# Patient Record
Sex: Female | Born: 1940 | Race: White | Hispanic: No | Marital: Single
Health system: Southern US, Academic
[De-identification: ages and names within clinical notes are randomized; demographics above are authoritative.]

## PROBLEM LIST (undated history)

## (undated) ENCOUNTER — Encounter: Attending: Physician Assistant | Primary: Physician Assistant

## (undated) ENCOUNTER — Encounter

## (undated) ENCOUNTER — Ambulatory Visit: Payer: MEDICARE

## (undated) ENCOUNTER — Encounter: Attending: Neurology | Primary: Neurology

## (undated) ENCOUNTER — Telehealth

## (undated) ENCOUNTER — Ambulatory Visit

## (undated) ENCOUNTER — Encounter: Attending: Psychiatry | Primary: Psychiatry

## (undated) ENCOUNTER — Telehealth
Attending: Rehabilitative and Restorative Service Providers" | Primary: Rehabilitative and Restorative Service Providers"

## (undated) ENCOUNTER — Ambulatory Visit
Payer: MEDICARE | Attending: Rehabilitative and Restorative Service Providers" | Primary: Rehabilitative and Restorative Service Providers"

## (undated) ENCOUNTER — Ambulatory Visit: Payer: MEDICARE | Attending: Addiction (Substance Use Disorder) | Primary: Addiction (Substance Use Disorder)

## (undated) ENCOUNTER — Telehealth: Attending: Physical Medicine & Rehabilitation | Primary: Physical Medicine & Rehabilitation

## (undated) ENCOUNTER — Ambulatory Visit: Payer: MEDICARE | Attending: Psychiatric/Mental Health | Primary: Psychiatric/Mental Health

## (undated) ENCOUNTER — Encounter: Attending: Physical Medicine & Rehabilitation | Primary: Physical Medicine & Rehabilitation

## (undated) ENCOUNTER — Encounter: Attending: Psychiatric/Mental Health | Primary: Psychiatric/Mental Health

## (undated) ENCOUNTER — Telehealth: Attending: Addiction (Substance Use Disorder) | Primary: Addiction (Substance Use Disorder)

## (undated) ENCOUNTER — Encounter: Attending: Addiction (Substance Use Disorder) | Primary: Addiction (Substance Use Disorder)

## (undated) ENCOUNTER — Telehealth: Attending: Clinical | Primary: Clinical

## (undated) ENCOUNTER — Ambulatory Visit: Payer: MEDICARE | Attending: Physical Medicine & Rehabilitation | Primary: Physical Medicine & Rehabilitation

## (undated) ENCOUNTER — Ambulatory Visit: Payer: MEDICARE | Attending: Anesthesiology | Primary: Anesthesiology

## (undated) ENCOUNTER — Telehealth: Attending: Physician Assistant | Primary: Physician Assistant

## (undated) ENCOUNTER — Ambulatory Visit: Payer: MEDICARE | Attending: Physician Assistant | Primary: Physician Assistant

## (undated) ENCOUNTER — Telehealth
Attending: Student in an Organized Health Care Education/Training Program | Primary: Student in an Organized Health Care Education/Training Program

## (undated) ENCOUNTER — Encounter: Attending: Adult Health | Primary: Adult Health

## (undated) ENCOUNTER — Encounter: Attending: Ophthalmology | Primary: Ophthalmology

## (undated) ENCOUNTER — Ambulatory Visit: Attending: Clinical | Primary: Clinical

## (undated) ENCOUNTER — Institutional Professional Consult (permissible substitution): Payer: MEDICARE

## (undated) ENCOUNTER — Inpatient Hospital Stay

## (undated) ENCOUNTER — Telehealth: Attending: Psychiatric/Mental Health | Primary: Psychiatric/Mental Health

## (undated) ENCOUNTER — Ambulatory Visit: Payer: MEDICARE | Attending: Internal Medicine | Primary: Internal Medicine

## (undated) ENCOUNTER — Ambulatory Visit: Payer: MEDICARE | Attending: Ophthalmology | Primary: Ophthalmology

## (undated) ENCOUNTER — Ambulatory Visit: Attending: Pharmacist | Primary: Pharmacist

## (undated) DIAGNOSIS — F209 Schizophrenia, unspecified: Secondary | ICD-10-CM

## (undated) DIAGNOSIS — F0390 Unspecified dementia without behavioral disturbance: Secondary | ICD-10-CM

## (undated) HISTORY — PX: HERNIA REPAIR: SHX51

## (undated) MED ORDER — CROMOLYN 5.2 MG/SPRAY (4 %) NASAL SPRAY: Freq: Two times a day (BID) | NASAL | 0 days | Status: SS

## (undated) MED ORDER — ACETAMINOPHEN 325 MG TABLET: Freq: Three times a day (TID) | ORAL | 0 days | Status: SS

## (undated) MED ORDER — ALUMINUM-MAG HYDROXIDE-SIMETHICONE 200 MG-200 MG-20 MG/5 ML ORAL SUSP: Freq: Three times a day (TID) | ORAL | 0 days | Status: SS

## (undated) MED ORDER — BISMUTH SUBSALICYLATE 524 MG/30 ML ORAL SUSPENSION IN PACKET: Freq: Four times a day (QID) | ORAL | 0.00000 days | Status: SS | PRN

## (undated) MED ORDER — BISACODYL 5 MG TABLET,DELAYED RELEASE: Freq: Every day | ORAL | 0 days | Status: SS | PRN

## (undated) MED ORDER — LIDOCAINE 4 % TOPICAL PATCH: Freq: Every day | TRANSDERMAL | 0.00000 days | PRN

## (undated) MED ORDER — DOCUSATE SODIUM 100 MG CAPSULE: Freq: Every day | ORAL | 0 days | Status: SS

## (undated) MED ORDER — OMEGA 3-DHA-EPA-VITAMIN D3 ORAL: Freq: Every day | ORAL | 0 days

## (undated) MED ORDER — DIPHENHYDRAMINE 25 MG CAPSULE: Freq: Every evening | ORAL | 0 days

## (undated) MED ORDER — CLONAZEPAM 0.25 MG DISINTEGRATING TABLET: Freq: Every evening | ORAL | 0 days | Status: SS

## (undated) MED ORDER — ACETAMINOPHEN ER 650 MG TABLET,EXTENDED RELEASE: Freq: Three times a day (TID) | ORAL | 0 days | PRN

## (undated) MED ORDER — FOLIC ACID 800 MCG TABLET: Freq: Every day | ORAL | 0.00000 days | Status: SS

## (undated) MED ORDER — STOMACH RELIEF 525 MG/15 ML ORAL SUSPENSION: Freq: Four times a day (QID) | ORAL | 0.00000 days | Status: SS | PRN

## (undated) MED ORDER — MELATONIN 3 MG TABLET: Freq: Every evening | ORAL | 0 days

## (undated) MED ORDER — FOLTABS 800 ORAL: Freq: Every day | ORAL | 0 days | Status: SS

## (undated) MED ORDER — ASHWAGANDHA ROOT EXTRACT(BULK) MISC: Freq: Every day | ORAL | 0 days

## (undated) MED ORDER — PONARIS NASAL SOLUTION: Freq: Two times a day (BID) | NASAL | 0 days | Status: SS

---
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
## 2017-03-07 ENCOUNTER — Ambulatory Visit: Admission: RE | Admit: 2017-03-07 | Discharge: 2017-03-07 | Payer: MEDICARE | Admitting: Geriatric Medicine

## 2017-03-07 DIAGNOSIS — G249 Dystonia, unspecified: Principal | ICD-10-CM

## 2017-03-07 DIAGNOSIS — F39 Unspecified mood [affective] disorder: Secondary | ICD-10-CM

## 2017-03-07 DIAGNOSIS — K582 Mixed irritable bowel syndrome: Secondary | ICD-10-CM

## 2017-03-07 DIAGNOSIS — G2581 Restless legs syndrome: Secondary | ICD-10-CM

## 2017-03-07 DIAGNOSIS — I10 Essential (primary) hypertension: Secondary | ICD-10-CM

## 2017-03-20 ENCOUNTER — Ambulatory Visit: Admission: RE | Admit: 2017-03-20 | Discharge: 2017-03-20 | Disposition: A | Discharge status: Home / Self Care

## 2017-03-20 DIAGNOSIS — I10 Essential (primary) hypertension: Principal | ICD-10-CM

## 2017-03-20 DIAGNOSIS — G2581 Restless legs syndrome: Secondary | ICD-10-CM

## 2017-03-20 DIAGNOSIS — G4719 Other hypersomnia: Secondary | ICD-10-CM

## 2017-03-20 DIAGNOSIS — G249 Dystonia, unspecified: Secondary | ICD-10-CM

## 2017-03-26 ENCOUNTER — Ambulatory Visit: Admission: RE | Admit: 2017-03-26 | Discharge: 2017-03-26

## 2017-03-26 DIAGNOSIS — G4719 Other hypersomnia: Secondary | ICD-10-CM

## 2017-03-26 DIAGNOSIS — M21371 Foot drop, right foot: Secondary | ICD-10-CM

## 2017-03-26 DIAGNOSIS — I10 Essential (primary) hypertension: Secondary | ICD-10-CM

## 2017-03-26 DIAGNOSIS — E049 Nontoxic goiter, unspecified: Principal | ICD-10-CM

## 2017-03-26 MED ORDER — METHIMAZOLE 5 MG TABLET
ORAL_TABLET | 2 refills | 0 days | Status: CP
Start: 2017-03-26 — End: 2017-12-10

## 2017-04-30 ENCOUNTER — Ambulatory Visit: Admission: RE | Admit: 2017-04-30 | Discharge: 2017-04-30

## 2017-04-30 DIAGNOSIS — I10 Essential (primary) hypertension: Principal | ICD-10-CM

## 2017-04-30 DIAGNOSIS — G4719 Other hypersomnia: Secondary | ICD-10-CM

## 2017-04-30 DIAGNOSIS — F39 Unspecified mood [affective] disorder: Secondary | ICD-10-CM

## 2017-05-05 ENCOUNTER — Ambulatory Visit
Admission: RE | Admit: 2017-05-05 | Discharge: 2017-05-05 | Payer: MEDICARE | Attending: Ophthalmology | Admitting: Ophthalmology

## 2017-05-05 DIAGNOSIS — H2512 Age-related nuclear cataract, left eye: Secondary | ICD-10-CM

## 2017-05-05 DIAGNOSIS — H04123 Dry eye syndrome of bilateral lacrimal glands: Principal | ICD-10-CM

## 2017-05-14 ENCOUNTER — Ambulatory Visit: Admission: RE | Admit: 2017-05-14 | Discharge: 2017-05-14 | Payer: MEDICARE

## 2017-05-15 ENCOUNTER — Ambulatory Visit
Admission: RE | Admit: 2017-05-15 | Discharge: 2017-05-15 | Disposition: A | Discharge status: Home / Self Care | Payer: MEDICARE | Attending: Family | Admitting: Family

## 2017-05-15 DIAGNOSIS — G4761 Periodic limb movement disorder: Secondary | ICD-10-CM

## 2017-05-15 DIAGNOSIS — G4719 Other hypersomnia: Principal | ICD-10-CM

## 2017-05-15 DIAGNOSIS — G2581 Restless legs syndrome: Secondary | ICD-10-CM

## 2017-05-15 DIAGNOSIS — R5383 Other fatigue: Secondary | ICD-10-CM

## 2017-05-16 ENCOUNTER — Ambulatory Visit: Admission: RE | Admit: 2017-05-16 | Discharge: 2017-05-16 | Payer: MEDICARE

## 2017-05-21 ENCOUNTER — Ambulatory Visit
Admission: RE | Admit: 2017-05-21 | Discharge: 2017-06-19 | Disposition: A | Discharge status: Home / Self Care | Payer: MEDICARE | Attending: Rehabilitative and Restorative Service Providers" | Admitting: Rehabilitative and Restorative Service Providers"

## 2017-05-21 ENCOUNTER — Ambulatory Visit
Admission: RE | Admit: 2017-05-21 | Discharge: 2017-06-19 | Disposition: A | Discharge status: Home / Self Care | Attending: Rehabilitative and Restorative Service Providers" | Admitting: Rehabilitative and Restorative Service Providers"

## 2017-05-21 DIAGNOSIS — R269 Unspecified abnormalities of gait and mobility: Secondary | ICD-10-CM

## 2017-05-21 DIAGNOSIS — M21371 Foot drop, right foot: Principal | ICD-10-CM

## 2017-06-02 DIAGNOSIS — M21371 Foot drop, right foot: Principal | ICD-10-CM

## 2017-06-02 DIAGNOSIS — R269 Unspecified abnormalities of gait and mobility: Secondary | ICD-10-CM

## 2017-06-09 MED ORDER — LOSARTAN 50 MG TABLET
ORAL_TABLET | Freq: Two times a day (BID) | ORAL | 3 refills | 0 days | Status: CP
Start: 2017-06-09 — End: 2017-08-26

## 2017-06-16 ENCOUNTER — Ambulatory Visit: Admission: RE | Admit: 2017-06-16 | Discharge: 2017-06-16

## 2017-06-16 DIAGNOSIS — K582 Mixed irritable bowel syndrome: Secondary | ICD-10-CM

## 2017-06-16 DIAGNOSIS — R269 Unspecified abnormalities of gait and mobility: Secondary | ICD-10-CM

## 2017-06-16 DIAGNOSIS — M810 Age-related osteoporosis without current pathological fracture: Principal | ICD-10-CM

## 2017-06-16 DIAGNOSIS — R51 Headache: Secondary | ICD-10-CM

## 2017-06-16 MED ORDER — RIZATRIPTAN 5 MG TABLET
ORAL_TABLET | Freq: Every day | ORAL | 0 refills | 0 days | Status: CP | PRN
Start: 2017-06-16 — End: 2017-07-06

## 2017-06-23 ENCOUNTER — Ambulatory Visit
Admission: RE | Admit: 2017-06-23 | Discharge: 2017-07-19 | Disposition: A | Discharge status: Home / Self Care | Payer: MEDICARE | Attending: Rehabilitative and Restorative Service Providers" | Admitting: Rehabilitative and Restorative Service Providers"

## 2017-06-23 DIAGNOSIS — R269 Unspecified abnormalities of gait and mobility: Secondary | ICD-10-CM

## 2017-06-23 DIAGNOSIS — M21371 Foot drop, right foot: Principal | ICD-10-CM

## 2017-06-30 DIAGNOSIS — M21371 Foot drop, right foot: Principal | ICD-10-CM

## 2017-06-30 DIAGNOSIS — R269 Unspecified abnormalities of gait and mobility: Secondary | ICD-10-CM

## 2017-07-07 ENCOUNTER — Ambulatory Visit
Admission: RE | Admit: 2017-07-07 | Discharge: 2017-07-07 | Payer: MEDICARE | Attending: Ophthalmology | Admitting: Ophthalmology

## 2017-07-07 DIAGNOSIS — H2512 Age-related nuclear cataract, left eye: Secondary | ICD-10-CM

## 2017-07-07 DIAGNOSIS — H04123 Dry eye syndrome of bilateral lacrimal glands: Principal | ICD-10-CM

## 2017-07-07 DIAGNOSIS — H1852 Epithelial (juvenile) corneal dystrophy: Secondary | ICD-10-CM

## 2017-07-07 MED ORDER — RIZATRIPTAN 5 MG TABLET
ORAL_TABLET | Freq: Every day | ORAL | 2 refills | 0 days | Status: CP | PRN
Start: 2017-07-07 — End: 2017-12-10

## 2017-07-14 ENCOUNTER — Ambulatory Visit
Admission: RE | Admit: 2017-07-14 | Discharge: 2017-07-14 | Disposition: A | Discharge status: Home / Self Care | Attending: Internal Medicine

## 2017-07-14 DIAGNOSIS — E0590 Thyrotoxicosis, unspecified without thyrotoxic crisis or storm: Secondary | ICD-10-CM

## 2017-07-14 DIAGNOSIS — M810 Age-related osteoporosis without current pathological fracture: Principal | ICD-10-CM

## 2017-07-14 DIAGNOSIS — E559 Vitamin D deficiency, unspecified: Secondary | ICD-10-CM

## 2017-07-15 ENCOUNTER — Ambulatory Visit
Admission: RE | Admit: 2017-07-15 | Discharge: 2017-07-15 | Disposition: A | Discharge status: Home / Self Care | Attending: Nutritionist | Admitting: Nutritionist

## 2017-07-15 DIAGNOSIS — K9049 Malabsorption due to intolerance, not elsewhere classified: Secondary | ICD-10-CM

## 2017-07-15 DIAGNOSIS — K582 Mixed irritable bowel syndrome: Principal | ICD-10-CM

## 2017-07-17 ENCOUNTER — Ambulatory Visit: Admission: RE | Admit: 2017-07-17 | Discharge: 2017-07-17 | Payer: MEDICARE

## 2017-07-17 DIAGNOSIS — R269 Unspecified abnormalities of gait and mobility: Principal | ICD-10-CM

## 2017-07-17 DIAGNOSIS — R51 Headache: Secondary | ICD-10-CM

## 2017-07-17 DIAGNOSIS — M1990 Unspecified osteoarthritis, unspecified site: Secondary | ICD-10-CM

## 2017-07-17 DIAGNOSIS — I10 Essential (primary) hypertension: Secondary | ICD-10-CM

## 2017-07-23 ENCOUNTER — Ambulatory Visit
Admission: RE | Admit: 2017-07-23 | Discharge: 2017-07-23 | Disposition: A | Discharge status: Home / Self Care | Payer: MEDICARE

## 2017-07-23 DIAGNOSIS — E0590 Thyrotoxicosis, unspecified without thyrotoxic crisis or storm: Principal | ICD-10-CM

## 2017-08-26 ENCOUNTER — Ambulatory Visit: Admit: 2017-08-26 | Discharge: 2017-08-27 | Payer: MEDICARE | Attending: Family Medicine | Primary: Family Medicine

## 2017-08-26 ENCOUNTER — Ambulatory Visit
Admit: 2017-08-26 | Discharge: 2017-09-17 | Payer: MEDICARE | Attending: Rehabilitative and Restorative Service Providers" | Primary: Rehabilitative and Restorative Service Providers"

## 2017-08-26 DIAGNOSIS — W19XXXD Unspecified fall, subsequent encounter: Secondary | ICD-10-CM

## 2017-08-26 DIAGNOSIS — L309 Dermatitis, unspecified: Secondary | ICD-10-CM

## 2017-08-26 DIAGNOSIS — S61219A Laceration without foreign body of unspecified finger without damage to nail, initial encounter: Secondary | ICD-10-CM

## 2017-08-26 DIAGNOSIS — M810 Age-related osteoporosis without current pathological fracture: Secondary | ICD-10-CM

## 2017-08-26 DIAGNOSIS — F39 Unspecified mood [affective] disorder: Secondary | ICD-10-CM

## 2017-08-26 DIAGNOSIS — I10 Essential (primary) hypertension: Principal | ICD-10-CM

## 2017-08-26 DIAGNOSIS — M1990 Unspecified osteoarthritis, unspecified site: Secondary | ICD-10-CM

## 2017-08-26 DIAGNOSIS — R269 Unspecified abnormalities of gait and mobility: Principal | ICD-10-CM

## 2017-08-26 MED ORDER — AMLODIPINE 2.5 MG TABLET
ORAL_TABLET | Freq: Every day | ORAL | 1 refills | 0 days | Status: CP
Start: 2017-08-26 — End: 2017-10-10

## 2017-08-28 ENCOUNTER — Ambulatory Visit: Admit: 2017-08-28 | Discharge: 2017-09-10 | Payer: MEDICARE

## 2017-08-28 DIAGNOSIS — E0590 Thyrotoxicosis, unspecified without thyrotoxic crisis or storm: Principal | ICD-10-CM

## 2017-08-29 DIAGNOSIS — E0590 Thyrotoxicosis, unspecified without thyrotoxic crisis or storm: Principal | ICD-10-CM

## 2017-09-10 ENCOUNTER — Ambulatory Visit: Admit: 2017-09-10 | Discharge: 2017-09-11 | Payer: MEDICARE

## 2017-09-10 DIAGNOSIS — R51 Headache: Secondary | ICD-10-CM

## 2017-09-10 DIAGNOSIS — M810 Age-related osteoporosis without current pathological fracture: Secondary | ICD-10-CM

## 2017-09-10 DIAGNOSIS — F39 Unspecified mood [affective] disorder: Secondary | ICD-10-CM

## 2017-09-10 DIAGNOSIS — Z1239 Encounter for other screening for malignant neoplasm of breast: Secondary | ICD-10-CM

## 2017-09-10 DIAGNOSIS — I10 Essential (primary) hypertension: Principal | ICD-10-CM

## 2017-09-10 DIAGNOSIS — E0590 Thyrotoxicosis, unspecified without thyrotoxic crisis or storm: Secondary | ICD-10-CM

## 2017-09-12 MED ORDER — CARBIDOPA 25 MG-LEVODOPA 100 MG TABLET
ORAL_TABLET | Freq: Every evening | ORAL | 3 refills | 0 days | Status: CP | PRN
Start: 2017-09-12 — End: 2019-03-01

## 2017-09-22 ENCOUNTER — Ambulatory Visit: Admit: 2017-09-22 | Discharge: 2017-09-23 | Payer: MEDICARE | Attending: Ophthalmology | Primary: Ophthalmology

## 2017-09-22 DIAGNOSIS — H1852 Epithelial (juvenile) corneal dystrophy: Secondary | ICD-10-CM

## 2017-09-22 DIAGNOSIS — H04123 Dry eye syndrome of bilateral lacrimal glands: Principal | ICD-10-CM

## 2017-09-22 DIAGNOSIS — H2512 Age-related nuclear cataract, left eye: Secondary | ICD-10-CM

## 2017-09-22 MED ORDER — PREDNISOLONE ACETATE 1 % EYE DROPS,SUSPENSION
1 refills | 0 days | Status: CP
Start: 2017-09-22 — End: 2018-01-31

## 2017-10-10 ENCOUNTER — Ambulatory Visit: Admit: 2017-10-10 | Discharge: 2017-10-11 | Payer: MEDICARE

## 2017-10-10 DIAGNOSIS — K582 Mixed irritable bowel syndrome: Secondary | ICD-10-CM

## 2017-10-10 DIAGNOSIS — R51 Headache: Secondary | ICD-10-CM

## 2017-10-10 DIAGNOSIS — I10 Essential (primary) hypertension: Principal | ICD-10-CM

## 2017-10-10 DIAGNOSIS — E0590 Thyrotoxicosis, unspecified without thyrotoxic crisis or storm: Secondary | ICD-10-CM

## 2017-10-10 DIAGNOSIS — M545 Low back pain: Secondary | ICD-10-CM

## 2017-10-10 DIAGNOSIS — M810 Age-related osteoporosis without current pathological fracture: Secondary | ICD-10-CM

## 2017-10-10 MED ORDER — AMLODIPINE 5 MG TABLET: 5 mg | tablet | Freq: Every day | 1 refills | 0 days | Status: AC

## 2017-10-10 MED ORDER — AMLODIPINE 5 MG TABLET
ORAL_TABLET | Freq: Every day | ORAL | 1 refills | 0.00000 days | Status: CP
Start: 2017-10-10 — End: 2017-10-10

## 2017-10-15 MED ORDER — AMLODIPINE 2.5 MG TABLET
ORAL_TABLET | Freq: Every day | ORAL | 1 refills | 0 days | Status: CP
Start: 2017-10-15 — End: 2017-10-27

## 2017-10-23 MED ORDER — POLYETHYLENE GLYCOL 3350 17 GRAM/DOSE ORAL POWDER
Freq: Every day | ORAL | 3 refills | 0.00000 days | Status: CP
Start: 2017-10-23 — End: 2017-12-10

## 2017-10-27 MED ORDER — AMLODIPINE 2.5 MG TABLET
ORAL_TABLET | Freq: Every day | ORAL | 6 refills | 0.00000 days | Status: CP
Start: 2017-10-27 — End: 2017-11-06

## 2017-10-28 ENCOUNTER — Ambulatory Visit: Admit: 2017-10-28 | Discharge: 2017-10-28 | Payer: MEDICARE

## 2017-10-28 DIAGNOSIS — M810 Age-related osteoporosis without current pathological fracture: Principal | ICD-10-CM

## 2017-10-28 DIAGNOSIS — Z1239 Encounter for other screening for malignant neoplasm of breast: Principal | ICD-10-CM

## 2017-10-31 ENCOUNTER — Ambulatory Visit: Admit: 2017-10-31 | Discharge: 2017-11-01 | Payer: MEDICARE

## 2017-10-31 DIAGNOSIS — M1990 Unspecified osteoarthritis, unspecified site: Principal | ICD-10-CM

## 2017-10-31 DIAGNOSIS — M4800 Spinal stenosis, site unspecified: Secondary | ICD-10-CM

## 2017-11-03 ENCOUNTER — Ambulatory Visit: Admit: 2017-11-03 | Discharge: 2017-11-04 | Payer: MEDICARE | Attending: Ophthalmology | Primary: Ophthalmology

## 2017-11-03 DIAGNOSIS — H04123 Dry eye syndrome of bilateral lacrimal glands: Principal | ICD-10-CM

## 2017-11-03 DIAGNOSIS — H1852 Epithelial (juvenile) corneal dystrophy: Secondary | ICD-10-CM

## 2017-11-03 DIAGNOSIS — H2512 Age-related nuclear cataract, left eye: Secondary | ICD-10-CM

## 2017-11-05 ENCOUNTER — Other Ambulatory Visit: Admit: 2017-11-05 | Discharge: 2017-11-06 | Payer: MEDICARE

## 2017-11-05 DIAGNOSIS — E0590 Thyrotoxicosis, unspecified without thyrotoxic crisis or storm: Secondary | ICD-10-CM

## 2017-11-05 DIAGNOSIS — M810 Age-related osteoporosis without current pathological fracture: Secondary | ICD-10-CM

## 2017-11-05 DIAGNOSIS — I10 Essential (primary) hypertension: Principal | ICD-10-CM

## 2017-11-06 MED ORDER — AMLODIPINE 5 MG TABLET
ORAL_TABLET | Freq: Every day | ORAL | 3 refills | 0.00000 days | Status: CP
Start: 2017-11-06 — End: 2017-12-10

## 2017-11-28 ENCOUNTER — Ambulatory Visit: Admit: 2017-11-28 | Discharge: 2017-11-28 | Payer: MEDICARE

## 2017-11-28 DIAGNOSIS — M1990 Unspecified osteoarthritis, unspecified site: Principal | ICD-10-CM

## 2017-11-28 DIAGNOSIS — M4800 Spinal stenosis, site unspecified: Secondary | ICD-10-CM

## 2017-11-28 DIAGNOSIS — R52 Pain, unspecified: Principal | ICD-10-CM

## 2017-12-05 ENCOUNTER — Ambulatory Visit: Admit: 2017-12-05 | Discharge: 2017-12-16 | Payer: MEDICARE

## 2017-12-05 ENCOUNTER — Ambulatory Visit
Admit: 2017-12-05 | Discharge: 2017-12-16 | Payer: MEDICARE | Attending: Rehabilitative and Restorative Service Providers" | Primary: Rehabilitative and Restorative Service Providers"

## 2017-12-05 DIAGNOSIS — M4800 Spinal stenosis, site unspecified: Secondary | ICD-10-CM

## 2017-12-05 DIAGNOSIS — R269 Unspecified abnormalities of gait and mobility: Secondary | ICD-10-CM

## 2017-12-05 DIAGNOSIS — M1990 Unspecified osteoarthritis, unspecified site: Principal | ICD-10-CM

## 2017-12-10 ENCOUNTER — Ambulatory Visit: Admit: 2017-12-10 | Discharge: 2017-12-11 | Payer: MEDICARE

## 2017-12-10 DIAGNOSIS — M810 Age-related osteoporosis without current pathological fracture: Secondary | ICD-10-CM

## 2017-12-10 DIAGNOSIS — E0590 Thyrotoxicosis, unspecified without thyrotoxic crisis or storm: Secondary | ICD-10-CM

## 2017-12-10 DIAGNOSIS — H04129 Dry eye syndrome of unspecified lacrimal gland: Secondary | ICD-10-CM

## 2017-12-10 DIAGNOSIS — F39 Unspecified mood [affective] disorder: Secondary | ICD-10-CM

## 2017-12-10 DIAGNOSIS — W19XXXD Unspecified fall, subsequent encounter: Secondary | ICD-10-CM

## 2017-12-10 DIAGNOSIS — M1711 Unilateral primary osteoarthritis, right knee: Secondary | ICD-10-CM

## 2017-12-10 DIAGNOSIS — M545 Low back pain: Principal | ICD-10-CM

## 2017-12-10 DIAGNOSIS — I10 Essential (primary) hypertension: Secondary | ICD-10-CM

## 2017-12-10 DIAGNOSIS — E049 Nontoxic goiter, unspecified: Secondary | ICD-10-CM

## 2017-12-10 MED ORDER — LOSARTAN 50 MG TABLET
ORAL_TABLET | Freq: Two times a day (BID) | ORAL | 3 refills | 0 days | Status: CP
Start: 2017-12-10 — End: 2018-11-19

## 2017-12-10 MED ORDER — AMLODIPINE 5 MG TABLET
ORAL_TABLET | 3 refills | 0 days | Status: CP
Start: 2017-12-10 — End: 2018-05-26

## 2017-12-12 DIAGNOSIS — R269 Unspecified abnormalities of gait and mobility: Secondary | ICD-10-CM

## 2017-12-12 DIAGNOSIS — M4800 Spinal stenosis, site unspecified: Secondary | ICD-10-CM

## 2017-12-12 DIAGNOSIS — M1990 Unspecified osteoarthritis, unspecified site: Principal | ICD-10-CM

## 2017-12-12 DIAGNOSIS — M21371 Foot drop, right foot: Secondary | ICD-10-CM

## 2017-12-19 ENCOUNTER — Ambulatory Visit
Admit: 2017-12-19 | Discharge: 2018-01-15 | Payer: MEDICARE | Attending: Rehabilitative and Restorative Service Providers" | Primary: Rehabilitative and Restorative Service Providers"

## 2017-12-19 ENCOUNTER — Ambulatory Visit: Admit: 2017-12-19 | Discharge: 2018-01-15 | Payer: MEDICARE

## 2017-12-19 DIAGNOSIS — M4800 Spinal stenosis, site unspecified: Secondary | ICD-10-CM

## 2017-12-19 DIAGNOSIS — R269 Unspecified abnormalities of gait and mobility: Secondary | ICD-10-CM

## 2017-12-19 DIAGNOSIS — M1990 Unspecified osteoarthritis, unspecified site: Principal | ICD-10-CM

## 2017-12-22 ENCOUNTER — Ambulatory Visit: Admit: 2017-12-22 | Discharge: 2017-12-22 | Payer: MEDICARE

## 2017-12-22 DIAGNOSIS — E049 Nontoxic goiter, unspecified: Principal | ICD-10-CM

## 2017-12-25 DIAGNOSIS — M4800 Spinal stenosis, site unspecified: Secondary | ICD-10-CM

## 2017-12-25 DIAGNOSIS — R269 Unspecified abnormalities of gait and mobility: Secondary | ICD-10-CM

## 2017-12-25 DIAGNOSIS — M1990 Unspecified osteoarthritis, unspecified site: Principal | ICD-10-CM

## 2017-12-29 ENCOUNTER — Institutional Professional Consult (permissible substitution): Admit: 2017-12-29 | Discharge: 2017-12-30 | Payer: MEDICARE | Attending: Pharmacist | Primary: Pharmacist

## 2017-12-29 DIAGNOSIS — M810 Age-related osteoporosis without current pathological fracture: Principal | ICD-10-CM

## 2017-12-31 DIAGNOSIS — M1990 Unspecified osteoarthritis, unspecified site: Principal | ICD-10-CM

## 2017-12-31 DIAGNOSIS — R269 Unspecified abnormalities of gait and mobility: Secondary | ICD-10-CM

## 2018-01-14 ENCOUNTER — Ambulatory Visit: Admit: 2018-01-14 | Discharge: 2018-01-15 | Payer: MEDICARE

## 2018-01-14 DIAGNOSIS — J301 Allergic rhinitis due to pollen: Principal | ICD-10-CM

## 2018-01-20 ENCOUNTER — Ambulatory Visit: Admit: 2018-01-20 | Discharge: 2018-01-21 | Payer: MEDICARE | Attending: Internal Medicine | Primary: Internal Medicine

## 2018-01-20 DIAGNOSIS — M1711 Unilateral primary osteoarthritis, right knee: Principal | ICD-10-CM

## 2018-01-20 DIAGNOSIS — R269 Unspecified abnormalities of gait and mobility: Secondary | ICD-10-CM

## 2018-01-20 DIAGNOSIS — M792 Neuralgia and neuritis, unspecified: Secondary | ICD-10-CM

## 2018-01-20 DIAGNOSIS — M545 Low back pain: Secondary | ICD-10-CM

## 2018-01-21 ENCOUNTER — Ambulatory Visit
Admit: 2018-01-21 | Discharge: 2018-02-14 | Payer: MEDICARE | Attending: Rehabilitative and Restorative Service Providers" | Primary: Rehabilitative and Restorative Service Providers"

## 2018-01-21 ENCOUNTER — Ambulatory Visit: Admit: 2018-01-21 | Discharge: 2018-02-14 | Payer: MEDICARE

## 2018-01-21 DIAGNOSIS — R269 Unspecified abnormalities of gait and mobility: Secondary | ICD-10-CM

## 2018-01-21 DIAGNOSIS — M1990 Unspecified osteoarthritis, unspecified site: Principal | ICD-10-CM

## 2018-01-21 DIAGNOSIS — M4800 Spinal stenosis, site unspecified: Secondary | ICD-10-CM

## 2018-01-21 DIAGNOSIS — M21371 Foot drop, right foot: Secondary | ICD-10-CM

## 2018-01-28 DIAGNOSIS — M21371 Foot drop, right foot: Secondary | ICD-10-CM

## 2018-01-28 DIAGNOSIS — M1990 Unspecified osteoarthritis, unspecified site: Principal | ICD-10-CM

## 2018-01-28 DIAGNOSIS — R269 Unspecified abnormalities of gait and mobility: Secondary | ICD-10-CM

## 2018-01-28 DIAGNOSIS — M4800 Spinal stenosis, site unspecified: Secondary | ICD-10-CM

## 2018-02-02 ENCOUNTER — Inpatient Hospital Stay: Admit: 2018-02-02 | Payer: MEDICARE

## 2018-02-02 MED ORDER — PREDNISOLONE ACETATE 1 % EYE DROPS,SUSPENSION
1 refills | 0 days | Status: CP
Start: 2018-02-02 — End: ?

## 2018-02-11 ENCOUNTER — Ambulatory Visit: Admit: 2018-02-11 | Discharge: 2018-02-12 | Payer: MEDICARE

## 2018-02-11 DIAGNOSIS — H1852 Epithelial (juvenile) corneal dystrophy: Principal | ICD-10-CM

## 2018-02-11 DIAGNOSIS — H2512 Age-related nuclear cataract, left eye: Secondary | ICD-10-CM

## 2018-02-11 DIAGNOSIS — H04123 Dry eye syndrome of bilateral lacrimal glands: Secondary | ICD-10-CM

## 2018-02-11 MED ORDER — POLYMYXIN B SULFATE 10,000 UNIT-TRIMETHOPRIM 1 MG/ML EYE DROPS
Freq: Four times a day (QID) | OPHTHALMIC | 0 refills | 0 days | Status: CP
Start: 2018-02-11 — End: ?

## 2018-02-11 MED ORDER — SODIUM CHLORIDE 5 % EYE OINTMENT
Freq: Every evening | OPHTHALMIC | 12 refills | 0 days | Status: CP
Start: 2018-02-11 — End: 2019-03-01

## 2018-02-11 MED ORDER — SODIUM CHLORIDE 5 % EYE DROPS
Freq: Four times a day (QID) | OPHTHALMIC | 12 refills | 0.00000 days | Status: CP
Start: 2018-02-11 — End: 2019-02-11

## 2018-02-20 ENCOUNTER — Ambulatory Visit: Admit: 2018-02-20 | Discharge: 2018-02-21 | Payer: MEDICARE | Attending: Ophthalmology | Primary: Ophthalmology

## 2018-02-20 DIAGNOSIS — H04123 Dry eye syndrome of bilateral lacrimal glands: Secondary | ICD-10-CM

## 2018-02-20 DIAGNOSIS — H1852 Epithelial (juvenile) corneal dystrophy: Principal | ICD-10-CM

## 2018-02-20 DIAGNOSIS — H2512 Age-related nuclear cataract, left eye: Secondary | ICD-10-CM

## 2018-04-03 ENCOUNTER — Ambulatory Visit: Admit: 2018-04-03 | Discharge: 2018-04-04 | Payer: MEDICARE

## 2018-04-03 DIAGNOSIS — M545 Low back pain: Secondary | ICD-10-CM

## 2018-04-03 DIAGNOSIS — J301 Allergic rhinitis due to pollen: Secondary | ICD-10-CM

## 2018-04-03 DIAGNOSIS — H04129 Dry eye syndrome of unspecified lacrimal gland: Principal | ICD-10-CM

## 2018-04-03 DIAGNOSIS — I10 Essential (primary) hypertension: Secondary | ICD-10-CM

## 2018-04-03 DIAGNOSIS — E0590 Thyrotoxicosis, unspecified without thyrotoxic crisis or storm: Secondary | ICD-10-CM

## 2018-04-03 DIAGNOSIS — Z1211 Encounter for screening for malignant neoplasm of colon: Secondary | ICD-10-CM

## 2018-04-03 DIAGNOSIS — F39 Unspecified mood [affective] disorder: Secondary | ICD-10-CM

## 2018-04-08 ENCOUNTER — Ambulatory Visit
Admit: 2018-04-08 | Discharge: 2018-04-09 | Payer: MEDICARE | Attending: Physical Medicine & Rehabilitation | Primary: Physical Medicine & Rehabilitation

## 2018-04-08 DIAGNOSIS — M1711 Unilateral primary osteoarthritis, right knee: Principal | ICD-10-CM

## 2018-04-08 DIAGNOSIS — M545 Low back pain: Secondary | ICD-10-CM

## 2018-04-08 DIAGNOSIS — M792 Neuralgia and neuritis, unspecified: Secondary | ICD-10-CM

## 2018-04-08 DIAGNOSIS — M21371 Foot drop, right foot: Secondary | ICD-10-CM

## 2018-04-08 DIAGNOSIS — R269 Unspecified abnormalities of gait and mobility: Secondary | ICD-10-CM

## 2018-04-10 MED ORDER — DIAZEPAM 5 MG TABLET
ORAL_TABLET | Freq: Once | ORAL | 0 refills | 0 days | Status: CP
Start: 2018-04-10 — End: 2018-04-10

## 2018-04-28 ENCOUNTER — Ambulatory Visit: Admit: 2018-04-28 | Discharge: 2018-04-29 | Payer: MEDICARE

## 2018-04-28 DIAGNOSIS — R269 Unspecified abnormalities of gait and mobility: Secondary | ICD-10-CM

## 2018-04-28 DIAGNOSIS — M21371 Foot drop, right foot: Secondary | ICD-10-CM

## 2018-04-28 DIAGNOSIS — M545 Low back pain: Principal | ICD-10-CM

## 2018-05-08 ENCOUNTER — Ambulatory Visit
Admit: 2018-05-08 | Discharge: 2018-05-09 | Payer: MEDICARE | Attending: Physical Medicine & Rehabilitation | Primary: Physical Medicine & Rehabilitation

## 2018-05-08 DIAGNOSIS — R269 Unspecified abnormalities of gait and mobility: Secondary | ICD-10-CM

## 2018-05-08 DIAGNOSIS — M21371 Foot drop, right foot: Secondary | ICD-10-CM

## 2018-05-08 DIAGNOSIS — M1711 Unilateral primary osteoarthritis, right knee: Secondary | ICD-10-CM

## 2018-05-08 DIAGNOSIS — M48061 Spinal stenosis, lumbar region without neurogenic claudication: Principal | ICD-10-CM

## 2018-05-08 DIAGNOSIS — L26 Exfoliative dermatitis: Secondary | ICD-10-CM

## 2018-05-26 ENCOUNTER — Ambulatory Visit
Admit: 2018-05-26 | Discharge: 2018-05-27 | Payer: MEDICARE | Attending: Physician Assistant | Primary: Physician Assistant

## 2018-05-26 DIAGNOSIS — L309 Dermatitis, unspecified: Secondary | ICD-10-CM

## 2018-05-26 DIAGNOSIS — L570 Actinic keratosis: Principal | ICD-10-CM

## 2018-05-26 DIAGNOSIS — I781 Nevus, non-neoplastic: Secondary | ICD-10-CM

## 2018-05-26 MED ORDER — AMLODIPINE 5 MG TABLET
ORAL_TABLET | 3 refills | 0 days | Status: CP
Start: 2018-05-26 — End: 2018-05-27

## 2018-05-27 ENCOUNTER — Ambulatory Visit: Admit: 2018-05-27 | Discharge: 2018-05-28 | Payer: MEDICARE

## 2018-05-27 ENCOUNTER — Ambulatory Visit
Admit: 2018-05-27 | Discharge: 2018-05-28 | Payer: MEDICARE | Attending: Physical Medicine & Rehabilitation | Primary: Physical Medicine & Rehabilitation

## 2018-05-27 DIAGNOSIS — M21371 Foot drop, right foot: Secondary | ICD-10-CM

## 2018-05-27 DIAGNOSIS — H04129 Dry eye syndrome of unspecified lacrimal gland: Principal | ICD-10-CM

## 2018-05-27 DIAGNOSIS — H1852 Epithelial (juvenile) corneal dystrophy: Secondary | ICD-10-CM

## 2018-05-27 DIAGNOSIS — M545 Low back pain: Principal | ICD-10-CM

## 2018-05-27 MED ORDER — AMLODIPINE 5 MG TABLET
ORAL_TABLET | Freq: Two times a day (BID) | ORAL | 3 refills | 0.00000 days | Status: CP
Start: 2018-05-27 — End: ?

## 2018-06-05 ENCOUNTER — Ambulatory Visit
Admit: 2018-06-05 | Discharge: 2018-06-06 | Payer: MEDICARE | Attending: Physical Medicine & Rehabilitation | Primary: Physical Medicine & Rehabilitation

## 2018-06-05 DIAGNOSIS — M21371 Foot drop, right foot: Secondary | ICD-10-CM

## 2018-06-05 DIAGNOSIS — M48061 Spinal stenosis, lumbar region without neurogenic claudication: Principal | ICD-10-CM

## 2018-06-05 DIAGNOSIS — R269 Unspecified abnormalities of gait and mobility: Secondary | ICD-10-CM

## 2018-06-05 DIAGNOSIS — M1711 Unilateral primary osteoarthritis, right knee: Secondary | ICD-10-CM

## 2018-06-17 ENCOUNTER — Ambulatory Visit: Admit: 2018-06-17 | Discharge: 2018-06-18 | Payer: MEDICARE

## 2018-06-17 DIAGNOSIS — E0590 Thyrotoxicosis, unspecified without thyrotoxic crisis or storm: Secondary | ICD-10-CM

## 2018-06-17 DIAGNOSIS — Z1211 Encounter for screening for malignant neoplasm of colon: Secondary | ICD-10-CM

## 2018-06-17 DIAGNOSIS — M545 Low back pain: Principal | ICD-10-CM

## 2018-06-17 DIAGNOSIS — M810 Age-related osteoporosis without current pathological fracture: Secondary | ICD-10-CM

## 2018-06-17 DIAGNOSIS — F39 Unspecified mood [affective] disorder: Secondary | ICD-10-CM

## 2018-06-22 ENCOUNTER — Ambulatory Visit
Admit: 2018-06-22 | Discharge: 2018-07-21 | Payer: MEDICARE | Attending: Rehabilitative and Restorative Service Providers" | Primary: Rehabilitative and Restorative Service Providers"

## 2018-06-22 DIAGNOSIS — M48061 Spinal stenosis, lumbar region without neurogenic claudication: Principal | ICD-10-CM

## 2018-06-22 DIAGNOSIS — M21371 Foot drop, right foot: Secondary | ICD-10-CM

## 2018-06-22 DIAGNOSIS — R269 Unspecified abnormalities of gait and mobility: Secondary | ICD-10-CM

## 2018-06-29 DIAGNOSIS — M48061 Spinal stenosis, lumbar region without neurogenic claudication: Principal | ICD-10-CM

## 2018-06-29 DIAGNOSIS — M21371 Foot drop, right foot: Secondary | ICD-10-CM

## 2018-06-29 DIAGNOSIS — M1990 Unspecified osteoarthritis, unspecified site: Secondary | ICD-10-CM

## 2018-06-29 DIAGNOSIS — M4800 Spinal stenosis, site unspecified: Secondary | ICD-10-CM

## 2018-06-29 DIAGNOSIS — R269 Unspecified abnormalities of gait and mobility: Secondary | ICD-10-CM

## 2018-07-03 DIAGNOSIS — M21371 Foot drop, right foot: Secondary | ICD-10-CM

## 2018-07-03 DIAGNOSIS — R269 Unspecified abnormalities of gait and mobility: Secondary | ICD-10-CM

## 2018-07-03 DIAGNOSIS — M1990 Unspecified osteoarthritis, unspecified site: Secondary | ICD-10-CM

## 2018-07-03 DIAGNOSIS — M4800 Spinal stenosis, site unspecified: Secondary | ICD-10-CM

## 2018-07-03 DIAGNOSIS — M48061 Spinal stenosis, lumbar region without neurogenic claudication: Principal | ICD-10-CM

## 2018-07-06 DIAGNOSIS — M1990 Unspecified osteoarthritis, unspecified site: Secondary | ICD-10-CM

## 2018-07-06 DIAGNOSIS — M21371 Foot drop, right foot: Secondary | ICD-10-CM

## 2018-07-06 DIAGNOSIS — M48061 Spinal stenosis, lumbar region without neurogenic claudication: Principal | ICD-10-CM

## 2018-07-06 DIAGNOSIS — M4800 Spinal stenosis, site unspecified: Secondary | ICD-10-CM

## 2018-07-06 DIAGNOSIS — R269 Unspecified abnormalities of gait and mobility: Secondary | ICD-10-CM

## 2018-07-10 DIAGNOSIS — M21371 Foot drop, right foot: Secondary | ICD-10-CM

## 2018-07-10 DIAGNOSIS — M48061 Spinal stenosis, lumbar region without neurogenic claudication: Principal | ICD-10-CM

## 2018-07-10 DIAGNOSIS — R269 Unspecified abnormalities of gait and mobility: Secondary | ICD-10-CM

## 2018-07-10 DIAGNOSIS — M1990 Unspecified osteoarthritis, unspecified site: Secondary | ICD-10-CM

## 2018-07-13 DIAGNOSIS — M1990 Unspecified osteoarthritis, unspecified site: Secondary | ICD-10-CM

## 2018-07-13 DIAGNOSIS — R269 Unspecified abnormalities of gait and mobility: Secondary | ICD-10-CM

## 2018-07-13 DIAGNOSIS — M48061 Spinal stenosis, lumbar region without neurogenic claudication: Principal | ICD-10-CM

## 2018-07-13 DIAGNOSIS — M21371 Foot drop, right foot: Secondary | ICD-10-CM

## 2018-07-13 DIAGNOSIS — M4800 Spinal stenosis, site unspecified: Secondary | ICD-10-CM

## 2018-07-15 DIAGNOSIS — M1990 Unspecified osteoarthritis, unspecified site: Secondary | ICD-10-CM

## 2018-07-15 DIAGNOSIS — R269 Unspecified abnormalities of gait and mobility: Secondary | ICD-10-CM

## 2018-07-15 DIAGNOSIS — M4800 Spinal stenosis, site unspecified: Secondary | ICD-10-CM

## 2018-07-15 DIAGNOSIS — M48061 Spinal stenosis, lumbar region without neurogenic claudication: Principal | ICD-10-CM

## 2018-07-15 DIAGNOSIS — M21371 Foot drop, right foot: Secondary | ICD-10-CM

## 2018-07-28 ENCOUNTER — Ambulatory Visit: Admit: 2018-07-28 | Discharge: 2018-07-29 | Payer: MEDICARE

## 2018-07-28 DIAGNOSIS — M48061 Spinal stenosis, lumbar region without neurogenic claudication: Principal | ICD-10-CM

## 2018-07-28 DIAGNOSIS — M21371 Foot drop, right foot: Secondary | ICD-10-CM

## 2018-07-28 DIAGNOSIS — M47816 Spondylosis without myelopathy or radiculopathy, lumbar region: Secondary | ICD-10-CM

## 2018-07-28 DIAGNOSIS — M5136 Other intervertebral disc degeneration, lumbar region: Secondary | ICD-10-CM

## 2018-07-29 ENCOUNTER — Ambulatory Visit: Admit: 2018-07-29 | Discharge: 2018-07-30 | Payer: MEDICARE

## 2018-07-29 DIAGNOSIS — M545 Low back pain: Secondary | ICD-10-CM

## 2018-07-29 DIAGNOSIS — H04129 Dry eye syndrome of unspecified lacrimal gland: Secondary | ICD-10-CM

## 2018-07-29 DIAGNOSIS — E0590 Thyrotoxicosis, unspecified without thyrotoxic crisis or storm: Principal | ICD-10-CM

## 2018-07-29 DIAGNOSIS — F39 Unspecified mood [affective] disorder: Secondary | ICD-10-CM

## 2018-07-31 ENCOUNTER — Ambulatory Visit
Admit: 2018-07-31 | Discharge: 2018-08-01 | Payer: MEDICARE | Attending: Physical Medicine & Rehabilitation | Primary: Physical Medicine & Rehabilitation

## 2018-07-31 DIAGNOSIS — R269 Unspecified abnormalities of gait and mobility: Secondary | ICD-10-CM

## 2018-07-31 DIAGNOSIS — M1711 Unilateral primary osteoarthritis, right knee: Secondary | ICD-10-CM

## 2018-07-31 DIAGNOSIS — M21371 Foot drop, right foot: Secondary | ICD-10-CM

## 2018-07-31 DIAGNOSIS — M47812 Spondylosis without myelopathy or radiculopathy, cervical region: Secondary | ICD-10-CM

## 2018-07-31 DIAGNOSIS — M48061 Spinal stenosis, lumbar region without neurogenic claudication: Principal | ICD-10-CM

## 2018-07-31 DIAGNOSIS — M4802 Spinal stenosis, cervical region: Secondary | ICD-10-CM

## 2018-08-20 ENCOUNTER — Ambulatory Visit
Admit: 2018-08-20 | Discharge: 2018-08-21 | Payer: MEDICARE | Attending: Physician Assistant | Primary: Physician Assistant

## 2018-08-20 DIAGNOSIS — L814 Other melanin hyperpigmentation: Secondary | ICD-10-CM

## 2018-08-20 DIAGNOSIS — I781 Nevus, non-neoplastic: Secondary | ICD-10-CM

## 2018-08-20 DIAGNOSIS — L570 Actinic keratosis: Principal | ICD-10-CM

## 2018-08-26 ENCOUNTER — Inpatient Hospital Stay: Admit: 2018-08-26 | Discharge: 2018-09-24 | Payer: MEDICARE

## 2018-09-02 ENCOUNTER — Ambulatory Visit: Admit: 2018-09-02 | Discharge: 2018-09-03 | Payer: MEDICARE

## 2018-09-02 DIAGNOSIS — M810 Age-related osteoporosis without current pathological fracture: Secondary | ICD-10-CM

## 2018-09-02 DIAGNOSIS — E0590 Thyrotoxicosis, unspecified without thyrotoxic crisis or storm: Secondary | ICD-10-CM

## 2018-09-02 DIAGNOSIS — M545 Low back pain: Principal | ICD-10-CM

## 2018-09-02 DIAGNOSIS — F39 Unspecified mood [affective] disorder: Secondary | ICD-10-CM

## 2018-09-03 ENCOUNTER — Ambulatory Visit
Admit: 2018-09-03 | Discharge: 2018-09-04 | Payer: MEDICARE | Attending: Physician Assistant | Primary: Physician Assistant

## 2018-09-03 DIAGNOSIS — R109 Unspecified abdominal pain: Principal | ICD-10-CM

## 2018-10-01 ENCOUNTER — Institutional Professional Consult (permissible substitution): Admit: 2018-10-01 | Discharge: 2018-10-02 | Payer: MEDICARE

## 2018-10-01 DIAGNOSIS — R109 Unspecified abdominal pain: Principal | ICD-10-CM

## 2018-10-16 ENCOUNTER — Ambulatory Visit
Admit: 2018-10-16 | Discharge: 2018-10-17 | Payer: MEDICARE | Attending: Physical Medicine & Rehabilitation | Primary: Physical Medicine & Rehabilitation

## 2018-10-16 DIAGNOSIS — M810 Age-related osteoporosis without current pathological fracture: Secondary | ICD-10-CM

## 2018-10-16 DIAGNOSIS — M797 Fibromyalgia: Secondary | ICD-10-CM

## 2018-10-16 DIAGNOSIS — K219 Gastro-esophageal reflux disease without esophagitis: Secondary | ICD-10-CM

## 2018-10-16 DIAGNOSIS — M21371 Foot drop, right foot: Secondary | ICD-10-CM

## 2018-10-16 DIAGNOSIS — R269 Unspecified abnormalities of gait and mobility: Secondary | ICD-10-CM

## 2018-10-16 DIAGNOSIS — M48061 Spinal stenosis, lumbar region without neurogenic claudication: Secondary | ICD-10-CM

## 2018-10-16 DIAGNOSIS — M7918 Myalgia, other site: Secondary | ICD-10-CM

## 2018-10-16 DIAGNOSIS — M19042 Primary osteoarthritis, left hand: Secondary | ICD-10-CM

## 2018-10-16 DIAGNOSIS — I10 Essential (primary) hypertension: Secondary | ICD-10-CM

## 2018-10-16 DIAGNOSIS — M545 Low back pain: Principal | ICD-10-CM

## 2018-10-16 DIAGNOSIS — H1850 Unspecified hereditary corneal dystrophies: Secondary | ICD-10-CM

## 2018-10-16 DIAGNOSIS — M2170 Unequal limb length (acquired), unspecified site: Secondary | ICD-10-CM

## 2018-10-16 DIAGNOSIS — Z602 Problems related to living alone: Secondary | ICD-10-CM

## 2018-10-16 DIAGNOSIS — Z7982 Long term (current) use of aspirin: Secondary | ICD-10-CM

## 2018-10-16 DIAGNOSIS — Z9181 History of falling: Secondary | ICD-10-CM

## 2018-10-16 DIAGNOSIS — J301 Allergic rhinitis due to pollen: Secondary | ICD-10-CM

## 2018-10-16 DIAGNOSIS — M47812 Spondylosis without myelopathy or radiculopathy, cervical region: Secondary | ICD-10-CM

## 2018-10-16 DIAGNOSIS — E049 Nontoxic goiter, unspecified: Secondary | ICD-10-CM

## 2018-10-16 DIAGNOSIS — M19041 Primary osteoarthritis, right hand: Secondary | ICD-10-CM

## 2018-10-16 DIAGNOSIS — Z87891 Personal history of nicotine dependence: Secondary | ICD-10-CM

## 2018-10-16 DIAGNOSIS — G2581 Restless legs syndrome: Secondary | ICD-10-CM

## 2018-10-16 DIAGNOSIS — M4802 Spinal stenosis, cervical region: Secondary | ICD-10-CM

## 2018-10-16 DIAGNOSIS — E0590 Thyrotoxicosis, unspecified without thyrotoxic crisis or storm: Secondary | ICD-10-CM

## 2018-10-16 DIAGNOSIS — H04123 Dry eye syndrome of bilateral lacrimal glands: Secondary | ICD-10-CM

## 2018-10-16 DIAGNOSIS — K582 Mixed irritable bowel syndrome: Secondary | ICD-10-CM

## 2018-10-17 ENCOUNTER — Encounter: Admit: 2018-10-17 | Discharge: 2018-11-13 | Payer: MEDICARE

## 2018-10-17 ENCOUNTER — Inpatient Hospital Stay: Admit: 2018-10-17 | Discharge: 2018-11-13 | Payer: MEDICARE

## 2018-10-17 DIAGNOSIS — K219 Gastro-esophageal reflux disease without esophagitis: Secondary | ICD-10-CM

## 2018-10-17 DIAGNOSIS — M4802 Spinal stenosis, cervical region: Secondary | ICD-10-CM

## 2018-10-17 DIAGNOSIS — E049 Nontoxic goiter, unspecified: Secondary | ICD-10-CM

## 2018-10-17 DIAGNOSIS — G2581 Restless legs syndrome: Secondary | ICD-10-CM

## 2018-10-17 DIAGNOSIS — M797 Fibromyalgia: Secondary | ICD-10-CM

## 2018-10-17 DIAGNOSIS — I10 Essential (primary) hypertension: Secondary | ICD-10-CM

## 2018-10-17 DIAGNOSIS — Z602 Problems related to living alone: Secondary | ICD-10-CM

## 2018-10-17 DIAGNOSIS — M19042 Primary osteoarthritis, left hand: Secondary | ICD-10-CM

## 2018-10-17 DIAGNOSIS — Z7982 Long term (current) use of aspirin: Secondary | ICD-10-CM

## 2018-10-17 DIAGNOSIS — K582 Mixed irritable bowel syndrome: Secondary | ICD-10-CM

## 2018-10-17 DIAGNOSIS — Z9181 History of falling: Secondary | ICD-10-CM

## 2018-10-17 DIAGNOSIS — Z87891 Personal history of nicotine dependence: Secondary | ICD-10-CM

## 2018-10-17 DIAGNOSIS — M19041 Primary osteoarthritis, right hand: Secondary | ICD-10-CM

## 2018-10-17 DIAGNOSIS — M2170 Unequal limb length (acquired), unspecified site: Secondary | ICD-10-CM

## 2018-10-17 DIAGNOSIS — H04123 Dry eye syndrome of bilateral lacrimal glands: Secondary | ICD-10-CM

## 2018-10-17 DIAGNOSIS — M810 Age-related osteoporosis without current pathological fracture: Secondary | ICD-10-CM

## 2018-10-17 DIAGNOSIS — M48061 Spinal stenosis, lumbar region without neurogenic claudication: Secondary | ICD-10-CM

## 2018-10-17 DIAGNOSIS — M47812 Spondylosis without myelopathy or radiculopathy, cervical region: Secondary | ICD-10-CM

## 2018-10-17 DIAGNOSIS — H1850 Unspecified hereditary corneal dystrophies: Secondary | ICD-10-CM

## 2018-10-17 DIAGNOSIS — J301 Allergic rhinitis due to pollen: Secondary | ICD-10-CM

## 2018-10-17 DIAGNOSIS — M21371 Foot drop, right foot: Principal | ICD-10-CM

## 2018-10-17 DIAGNOSIS — E0590 Thyrotoxicosis, unspecified without thyrotoxic crisis or storm: Secondary | ICD-10-CM

## 2018-10-19 DIAGNOSIS — M47812 Spondylosis without myelopathy or radiculopathy, cervical region: Principal | ICD-10-CM

## 2018-10-19 DIAGNOSIS — M48061 Spinal stenosis, lumbar region without neurogenic claudication: Principal | ICD-10-CM

## 2018-10-19 DIAGNOSIS — M19042 Primary osteoarthritis, left hand: Principal | ICD-10-CM

## 2018-10-19 DIAGNOSIS — M2170 Unequal limb length (acquired), unspecified site: Principal | ICD-10-CM

## 2018-10-19 DIAGNOSIS — M4802 Spinal stenosis, cervical region: Principal | ICD-10-CM

## 2018-10-19 DIAGNOSIS — H04123 Dry eye syndrome of bilateral lacrimal glands: Principal | ICD-10-CM

## 2018-10-19 DIAGNOSIS — Z602 Problems related to living alone: Principal | ICD-10-CM

## 2018-10-19 DIAGNOSIS — J301 Allergic rhinitis due to pollen: Principal | ICD-10-CM

## 2018-10-19 DIAGNOSIS — Z9181 History of falling: Principal | ICD-10-CM

## 2018-10-19 DIAGNOSIS — H1850 Unspecified hereditary corneal dystrophies: Principal | ICD-10-CM

## 2018-10-19 DIAGNOSIS — I10 Essential (primary) hypertension: Principal | ICD-10-CM

## 2018-10-19 DIAGNOSIS — E049 Nontoxic goiter, unspecified: Principal | ICD-10-CM

## 2018-10-19 DIAGNOSIS — M810 Age-related osteoporosis without current pathological fracture: Principal | ICD-10-CM

## 2018-10-19 DIAGNOSIS — K582 Mixed irritable bowel syndrome: Principal | ICD-10-CM

## 2018-10-19 DIAGNOSIS — G2581 Restless legs syndrome: Principal | ICD-10-CM

## 2018-10-19 DIAGNOSIS — K219 Gastro-esophageal reflux disease without esophagitis: Principal | ICD-10-CM

## 2018-10-19 DIAGNOSIS — Z7982 Long term (current) use of aspirin: Principal | ICD-10-CM

## 2018-10-19 DIAGNOSIS — M19041 Primary osteoarthritis, right hand: Principal | ICD-10-CM

## 2018-10-19 DIAGNOSIS — M21371 Foot drop, right foot: Principal | ICD-10-CM

## 2018-10-19 DIAGNOSIS — Z87891 Personal history of nicotine dependence: Principal | ICD-10-CM

## 2018-10-19 DIAGNOSIS — M797 Fibromyalgia: Principal | ICD-10-CM

## 2018-10-19 DIAGNOSIS — E0590 Thyrotoxicosis, unspecified without thyrotoxic crisis or storm: Principal | ICD-10-CM

## 2018-10-21 ENCOUNTER — Ambulatory Visit: Admit: 2018-10-21 | Discharge: 2018-10-22 | Payer: MEDICARE

## 2018-10-21 DIAGNOSIS — M797 Fibromyalgia: Principal | ICD-10-CM

## 2018-10-21 DIAGNOSIS — H04123 Dry eye syndrome of bilateral lacrimal glands: Principal | ICD-10-CM

## 2018-10-21 DIAGNOSIS — J309 Allergic rhinitis, unspecified: Principal | ICD-10-CM

## 2018-10-21 DIAGNOSIS — R269 Unspecified abnormalities of gait and mobility: Principal | ICD-10-CM

## 2018-10-21 DIAGNOSIS — K9049 Malabsorption due to intolerance, not elsewhere classified: Principal | ICD-10-CM

## 2018-10-21 DIAGNOSIS — M545 Low back pain: Principal | ICD-10-CM

## 2018-10-21 DIAGNOSIS — M1990 Unspecified osteoarthritis, unspecified site: Principal | ICD-10-CM

## 2018-10-21 DIAGNOSIS — M7918 Myalgia, other site: Principal | ICD-10-CM

## 2018-10-21 DIAGNOSIS — K219 Gastro-esophageal reflux disease without esophagitis: Principal | ICD-10-CM

## 2018-10-23 DIAGNOSIS — K219 Gastro-esophageal reflux disease without esophagitis: Principal | ICD-10-CM

## 2018-10-23 DIAGNOSIS — I10 Essential (primary) hypertension: Principal | ICD-10-CM

## 2018-10-23 DIAGNOSIS — M2170 Unequal limb length (acquired), unspecified site: Principal | ICD-10-CM

## 2018-10-23 DIAGNOSIS — M21371 Foot drop, right foot: Principal | ICD-10-CM

## 2018-10-23 DIAGNOSIS — E0590 Thyrotoxicosis, unspecified without thyrotoxic crisis or storm: Principal | ICD-10-CM

## 2018-10-23 DIAGNOSIS — M19042 Primary osteoarthritis, left hand: Principal | ICD-10-CM

## 2018-10-23 DIAGNOSIS — M810 Age-related osteoporosis without current pathological fracture: Principal | ICD-10-CM

## 2018-10-23 DIAGNOSIS — H1850 Unspecified hereditary corneal dystrophies: Principal | ICD-10-CM

## 2018-10-23 DIAGNOSIS — Z602 Problems related to living alone: Principal | ICD-10-CM

## 2018-10-23 DIAGNOSIS — Z7982 Long term (current) use of aspirin: Principal | ICD-10-CM

## 2018-10-23 DIAGNOSIS — H04123 Dry eye syndrome of bilateral lacrimal glands: Principal | ICD-10-CM

## 2018-10-23 DIAGNOSIS — J301 Allergic rhinitis due to pollen: Principal | ICD-10-CM

## 2018-10-23 DIAGNOSIS — M19041 Primary osteoarthritis, right hand: Principal | ICD-10-CM

## 2018-10-23 DIAGNOSIS — Z87891 Personal history of nicotine dependence: Principal | ICD-10-CM

## 2018-10-23 DIAGNOSIS — M47812 Spondylosis without myelopathy or radiculopathy, cervical region: Principal | ICD-10-CM

## 2018-10-23 DIAGNOSIS — E049 Nontoxic goiter, unspecified: Principal | ICD-10-CM

## 2018-10-23 DIAGNOSIS — K582 Mixed irritable bowel syndrome: Principal | ICD-10-CM

## 2018-10-23 DIAGNOSIS — M48061 Spinal stenosis, lumbar region without neurogenic claudication: Principal | ICD-10-CM

## 2018-10-23 DIAGNOSIS — Z9181 History of falling: Principal | ICD-10-CM

## 2018-10-23 DIAGNOSIS — M4802 Spinal stenosis, cervical region: Principal | ICD-10-CM

## 2018-10-23 DIAGNOSIS — G2581 Restless legs syndrome: Principal | ICD-10-CM

## 2018-10-23 DIAGNOSIS — M797 Fibromyalgia: Principal | ICD-10-CM

## 2018-10-26 DIAGNOSIS — G2581 Restless legs syndrome: Principal | ICD-10-CM

## 2018-10-26 DIAGNOSIS — M48061 Spinal stenosis, lumbar region without neurogenic claudication: Principal | ICD-10-CM

## 2018-10-26 DIAGNOSIS — H1850 Unspecified hereditary corneal dystrophies: Principal | ICD-10-CM

## 2018-10-26 DIAGNOSIS — M19041 Primary osteoarthritis, right hand: Principal | ICD-10-CM

## 2018-10-26 DIAGNOSIS — M797 Fibromyalgia: Principal | ICD-10-CM

## 2018-10-26 DIAGNOSIS — I10 Essential (primary) hypertension: Principal | ICD-10-CM

## 2018-10-26 DIAGNOSIS — J301 Allergic rhinitis due to pollen: Principal | ICD-10-CM

## 2018-10-26 DIAGNOSIS — Z87891 Personal history of nicotine dependence: Principal | ICD-10-CM

## 2018-10-26 DIAGNOSIS — E049 Nontoxic goiter, unspecified: Principal | ICD-10-CM

## 2018-10-26 DIAGNOSIS — Z602 Problems related to living alone: Principal | ICD-10-CM

## 2018-10-26 DIAGNOSIS — E0590 Thyrotoxicosis, unspecified without thyrotoxic crisis or storm: Principal | ICD-10-CM

## 2018-10-26 DIAGNOSIS — K219 Gastro-esophageal reflux disease without esophagitis: Principal | ICD-10-CM

## 2018-10-26 DIAGNOSIS — M21371 Foot drop, right foot: Principal | ICD-10-CM

## 2018-10-26 DIAGNOSIS — M19042 Primary osteoarthritis, left hand: Principal | ICD-10-CM

## 2018-10-26 DIAGNOSIS — M2170 Unequal limb length (acquired), unspecified site: Principal | ICD-10-CM

## 2018-10-26 DIAGNOSIS — Z9181 History of falling: Principal | ICD-10-CM

## 2018-10-26 DIAGNOSIS — M47812 Spondylosis without myelopathy or radiculopathy, cervical region: Principal | ICD-10-CM

## 2018-10-26 DIAGNOSIS — K582 Mixed irritable bowel syndrome: Principal | ICD-10-CM

## 2018-10-26 DIAGNOSIS — H04123 Dry eye syndrome of bilateral lacrimal glands: Principal | ICD-10-CM

## 2018-10-26 DIAGNOSIS — M4802 Spinal stenosis, cervical region: Principal | ICD-10-CM

## 2018-10-26 DIAGNOSIS — Z7982 Long term (current) use of aspirin: Principal | ICD-10-CM

## 2018-10-26 DIAGNOSIS — M810 Age-related osteoporosis without current pathological fracture: Principal | ICD-10-CM

## 2018-10-27 MED ORDER — CARBIDOPA 25 MG-LEVODOPA 100 MG TABLET
ORAL_TABLET | 3 refills | 0 days | Status: CP
Start: 2018-10-27 — End: ?

## 2018-10-28 DIAGNOSIS — E049 Nontoxic goiter, unspecified: Principal | ICD-10-CM

## 2018-10-28 DIAGNOSIS — Z9181 History of falling: Principal | ICD-10-CM

## 2018-10-28 DIAGNOSIS — J301 Allergic rhinitis due to pollen: Principal | ICD-10-CM

## 2018-10-28 DIAGNOSIS — Z7982 Long term (current) use of aspirin: Principal | ICD-10-CM

## 2018-10-28 DIAGNOSIS — H04123 Dry eye syndrome of bilateral lacrimal glands: Principal | ICD-10-CM

## 2018-10-28 DIAGNOSIS — I10 Essential (primary) hypertension: Principal | ICD-10-CM

## 2018-10-28 DIAGNOSIS — G2581 Restless legs syndrome: Principal | ICD-10-CM

## 2018-10-28 DIAGNOSIS — Z602 Problems related to living alone: Principal | ICD-10-CM

## 2018-10-28 DIAGNOSIS — M797 Fibromyalgia: Principal | ICD-10-CM

## 2018-10-28 DIAGNOSIS — M19041 Primary osteoarthritis, right hand: Principal | ICD-10-CM

## 2018-10-28 DIAGNOSIS — Z87891 Personal history of nicotine dependence: Principal | ICD-10-CM

## 2018-10-28 DIAGNOSIS — H1850 Unspecified hereditary corneal dystrophies: Principal | ICD-10-CM

## 2018-10-28 DIAGNOSIS — M48061 Spinal stenosis, lumbar region without neurogenic claudication: Principal | ICD-10-CM

## 2018-10-28 DIAGNOSIS — M810 Age-related osteoporosis without current pathological fracture: Principal | ICD-10-CM

## 2018-10-28 DIAGNOSIS — K582 Mixed irritable bowel syndrome: Principal | ICD-10-CM

## 2018-10-28 DIAGNOSIS — M2170 Unequal limb length (acquired), unspecified site: Principal | ICD-10-CM

## 2018-10-28 DIAGNOSIS — M47812 Spondylosis without myelopathy or radiculopathy, cervical region: Principal | ICD-10-CM

## 2018-10-28 DIAGNOSIS — E0590 Thyrotoxicosis, unspecified without thyrotoxic crisis or storm: Principal | ICD-10-CM

## 2018-10-28 DIAGNOSIS — K219 Gastro-esophageal reflux disease without esophagitis: Principal | ICD-10-CM

## 2018-10-28 DIAGNOSIS — M21371 Foot drop, right foot: Principal | ICD-10-CM

## 2018-10-28 DIAGNOSIS — M19042 Primary osteoarthritis, left hand: Principal | ICD-10-CM

## 2018-10-28 DIAGNOSIS — M4802 Spinal stenosis, cervical region: Principal | ICD-10-CM

## 2018-11-02 DIAGNOSIS — E049 Nontoxic goiter, unspecified: Principal | ICD-10-CM

## 2018-11-02 DIAGNOSIS — Z7982 Long term (current) use of aspirin: Principal | ICD-10-CM

## 2018-11-02 DIAGNOSIS — M797 Fibromyalgia: Principal | ICD-10-CM

## 2018-11-02 DIAGNOSIS — Z9181 History of falling: Principal | ICD-10-CM

## 2018-11-02 DIAGNOSIS — I10 Essential (primary) hypertension: Principal | ICD-10-CM

## 2018-11-02 DIAGNOSIS — M21371 Foot drop, right foot: Principal | ICD-10-CM

## 2018-11-02 DIAGNOSIS — K582 Mixed irritable bowel syndrome: Principal | ICD-10-CM

## 2018-11-02 DIAGNOSIS — M47812 Spondylosis without myelopathy or radiculopathy, cervical region: Principal | ICD-10-CM

## 2018-11-02 DIAGNOSIS — Z87891 Personal history of nicotine dependence: Principal | ICD-10-CM

## 2018-11-02 DIAGNOSIS — E0590 Thyrotoxicosis, unspecified without thyrotoxic crisis or storm: Principal | ICD-10-CM

## 2018-11-02 DIAGNOSIS — M2170 Unequal limb length (acquired), unspecified site: Principal | ICD-10-CM

## 2018-11-02 DIAGNOSIS — G2581 Restless legs syndrome: Principal | ICD-10-CM

## 2018-11-02 DIAGNOSIS — Z602 Problems related to living alone: Principal | ICD-10-CM

## 2018-11-02 DIAGNOSIS — H04123 Dry eye syndrome of bilateral lacrimal glands: Principal | ICD-10-CM

## 2018-11-02 DIAGNOSIS — M48061 Spinal stenosis, lumbar region without neurogenic claudication: Principal | ICD-10-CM

## 2018-11-02 DIAGNOSIS — J301 Allergic rhinitis due to pollen: Principal | ICD-10-CM

## 2018-11-02 DIAGNOSIS — M19042 Primary osteoarthritis, left hand: Principal | ICD-10-CM

## 2018-11-02 DIAGNOSIS — M19041 Primary osteoarthritis, right hand: Principal | ICD-10-CM

## 2018-11-02 DIAGNOSIS — M810 Age-related osteoporosis without current pathological fracture: Principal | ICD-10-CM

## 2018-11-02 DIAGNOSIS — M4802 Spinal stenosis, cervical region: Principal | ICD-10-CM

## 2018-11-02 DIAGNOSIS — K219 Gastro-esophageal reflux disease without esophagitis: Principal | ICD-10-CM

## 2018-11-02 DIAGNOSIS — H1850 Unspecified hereditary corneal dystrophies: Principal | ICD-10-CM

## 2018-11-06 DIAGNOSIS — K582 Mixed irritable bowel syndrome: Principal | ICD-10-CM

## 2018-11-06 DIAGNOSIS — G2581 Restless legs syndrome: Principal | ICD-10-CM

## 2018-11-06 DIAGNOSIS — M797 Fibromyalgia: Principal | ICD-10-CM

## 2018-11-06 DIAGNOSIS — E0590 Thyrotoxicosis, unspecified without thyrotoxic crisis or storm: Principal | ICD-10-CM

## 2018-11-06 DIAGNOSIS — M19041 Primary osteoarthritis, right hand: Principal | ICD-10-CM

## 2018-11-06 DIAGNOSIS — Z602 Problems related to living alone: Principal | ICD-10-CM

## 2018-11-06 DIAGNOSIS — H1850 Unspecified hereditary corneal dystrophies: Principal | ICD-10-CM

## 2018-11-06 DIAGNOSIS — M47812 Spondylosis without myelopathy or radiculopathy, cervical region: Principal | ICD-10-CM

## 2018-11-06 DIAGNOSIS — Z9181 History of falling: Principal | ICD-10-CM

## 2018-11-06 DIAGNOSIS — M21371 Foot drop, right foot: Principal | ICD-10-CM

## 2018-11-06 DIAGNOSIS — I10 Essential (primary) hypertension: Principal | ICD-10-CM

## 2018-11-06 DIAGNOSIS — E049 Nontoxic goiter, unspecified: Principal | ICD-10-CM

## 2018-11-06 DIAGNOSIS — Z87891 Personal history of nicotine dependence: Principal | ICD-10-CM

## 2018-11-06 DIAGNOSIS — M4802 Spinal stenosis, cervical region: Principal | ICD-10-CM

## 2018-11-06 DIAGNOSIS — K219 Gastro-esophageal reflux disease without esophagitis: Principal | ICD-10-CM

## 2018-11-06 DIAGNOSIS — Z7982 Long term (current) use of aspirin: Principal | ICD-10-CM

## 2018-11-06 DIAGNOSIS — H04123 Dry eye syndrome of bilateral lacrimal glands: Principal | ICD-10-CM

## 2018-11-06 DIAGNOSIS — M48061 Spinal stenosis, lumbar region without neurogenic claudication: Principal | ICD-10-CM

## 2018-11-06 DIAGNOSIS — M19042 Primary osteoarthritis, left hand: Principal | ICD-10-CM

## 2018-11-06 DIAGNOSIS — M2170 Unequal limb length (acquired), unspecified site: Principal | ICD-10-CM

## 2018-11-06 DIAGNOSIS — M810 Age-related osteoporosis without current pathological fracture: Principal | ICD-10-CM

## 2018-11-06 DIAGNOSIS — J301 Allergic rhinitis due to pollen: Principal | ICD-10-CM

## 2018-11-09 DIAGNOSIS — M810 Age-related osteoporosis without current pathological fracture: Principal | ICD-10-CM

## 2018-11-09 DIAGNOSIS — H1850 Unspecified hereditary corneal dystrophies: Principal | ICD-10-CM

## 2018-11-09 DIAGNOSIS — K582 Mixed irritable bowel syndrome: Principal | ICD-10-CM

## 2018-11-09 DIAGNOSIS — H04123 Dry eye syndrome of bilateral lacrimal glands: Principal | ICD-10-CM

## 2018-11-09 DIAGNOSIS — E049 Nontoxic goiter, unspecified: Principal | ICD-10-CM

## 2018-11-09 DIAGNOSIS — M48061 Spinal stenosis, lumbar region without neurogenic claudication: Principal | ICD-10-CM

## 2018-11-09 DIAGNOSIS — K219 Gastro-esophageal reflux disease without esophagitis: Principal | ICD-10-CM

## 2018-11-09 DIAGNOSIS — M4802 Spinal stenosis, cervical region: Principal | ICD-10-CM

## 2018-11-09 DIAGNOSIS — M2170 Unequal limb length (acquired), unspecified site: Principal | ICD-10-CM

## 2018-11-09 DIAGNOSIS — J301 Allergic rhinitis due to pollen: Principal | ICD-10-CM

## 2018-11-09 DIAGNOSIS — M21371 Foot drop, right foot: Principal | ICD-10-CM

## 2018-11-09 DIAGNOSIS — G2581 Restless legs syndrome: Principal | ICD-10-CM

## 2018-11-09 DIAGNOSIS — Z87891 Personal history of nicotine dependence: Principal | ICD-10-CM

## 2018-11-09 DIAGNOSIS — I10 Essential (primary) hypertension: Principal | ICD-10-CM

## 2018-11-09 DIAGNOSIS — M19042 Primary osteoarthritis, left hand: Principal | ICD-10-CM

## 2018-11-09 DIAGNOSIS — M47812 Spondylosis without myelopathy or radiculopathy, cervical region: Principal | ICD-10-CM

## 2018-11-09 DIAGNOSIS — Z9181 History of falling: Principal | ICD-10-CM

## 2018-11-09 DIAGNOSIS — Z7982 Long term (current) use of aspirin: Principal | ICD-10-CM

## 2018-11-09 DIAGNOSIS — Z602 Problems related to living alone: Principal | ICD-10-CM

## 2018-11-09 DIAGNOSIS — M19041 Primary osteoarthritis, right hand: Principal | ICD-10-CM

## 2018-11-09 DIAGNOSIS — E0590 Thyrotoxicosis, unspecified without thyrotoxic crisis or storm: Principal | ICD-10-CM

## 2018-11-09 DIAGNOSIS — M797 Fibromyalgia: Principal | ICD-10-CM

## 2018-11-13 DIAGNOSIS — Z7982 Long term (current) use of aspirin: Principal | ICD-10-CM

## 2018-11-13 DIAGNOSIS — K219 Gastro-esophageal reflux disease without esophagitis: Principal | ICD-10-CM

## 2018-11-13 DIAGNOSIS — J301 Allergic rhinitis due to pollen: Principal | ICD-10-CM

## 2018-11-13 DIAGNOSIS — M19041 Primary osteoarthritis, right hand: Principal | ICD-10-CM

## 2018-11-13 DIAGNOSIS — Z87891 Personal history of nicotine dependence: Principal | ICD-10-CM

## 2018-11-13 DIAGNOSIS — K582 Mixed irritable bowel syndrome: Principal | ICD-10-CM

## 2018-11-13 DIAGNOSIS — H04123 Dry eye syndrome of bilateral lacrimal glands: Principal | ICD-10-CM

## 2018-11-13 DIAGNOSIS — E0590 Thyrotoxicosis, unspecified without thyrotoxic crisis or storm: Principal | ICD-10-CM

## 2018-11-13 DIAGNOSIS — G2581 Restless legs syndrome: Principal | ICD-10-CM

## 2018-11-13 DIAGNOSIS — M21371 Foot drop, right foot: Principal | ICD-10-CM

## 2018-11-13 DIAGNOSIS — M47812 Spondylosis without myelopathy or radiculopathy, cervical region: Principal | ICD-10-CM

## 2018-11-13 DIAGNOSIS — M19042 Primary osteoarthritis, left hand: Principal | ICD-10-CM

## 2018-11-13 DIAGNOSIS — M810 Age-related osteoporosis without current pathological fracture: Principal | ICD-10-CM

## 2018-11-13 DIAGNOSIS — E049 Nontoxic goiter, unspecified: Principal | ICD-10-CM

## 2018-11-13 DIAGNOSIS — Z602 Problems related to living alone: Principal | ICD-10-CM

## 2018-11-13 DIAGNOSIS — I10 Essential (primary) hypertension: Principal | ICD-10-CM

## 2018-11-13 DIAGNOSIS — M48061 Spinal stenosis, lumbar region without neurogenic claudication: Principal | ICD-10-CM

## 2018-11-13 DIAGNOSIS — M2170 Unequal limb length (acquired), unspecified site: Principal | ICD-10-CM

## 2018-11-13 DIAGNOSIS — M4802 Spinal stenosis, cervical region: Principal | ICD-10-CM

## 2018-11-13 DIAGNOSIS — H1850 Unspecified hereditary corneal dystrophies: Principal | ICD-10-CM

## 2018-11-13 DIAGNOSIS — Z9181 History of falling: Principal | ICD-10-CM

## 2018-11-13 DIAGNOSIS — M797 Fibromyalgia: Principal | ICD-10-CM

## 2018-11-16 ENCOUNTER — Encounter: Admit: 2018-11-16 | Discharge: 2018-11-16 | Payer: MEDICARE

## 2018-11-17 DIAGNOSIS — M4802 Spinal stenosis, cervical region: Principal | ICD-10-CM

## 2018-11-17 DIAGNOSIS — I10 Essential (primary) hypertension: Principal | ICD-10-CM

## 2018-11-17 DIAGNOSIS — Z9181 History of falling: Principal | ICD-10-CM

## 2018-11-17 DIAGNOSIS — E049 Nontoxic goiter, unspecified: Principal | ICD-10-CM

## 2018-11-17 DIAGNOSIS — G2581 Restless legs syndrome: Principal | ICD-10-CM

## 2018-11-17 DIAGNOSIS — M21371 Foot drop, right foot: Principal | ICD-10-CM

## 2018-11-17 DIAGNOSIS — M47812 Spondylosis without myelopathy or radiculopathy, cervical region: Principal | ICD-10-CM

## 2018-11-17 DIAGNOSIS — Z87891 Personal history of nicotine dependence: Principal | ICD-10-CM

## 2018-11-17 DIAGNOSIS — M810 Age-related osteoporosis without current pathological fracture: Principal | ICD-10-CM

## 2018-11-17 DIAGNOSIS — E0590 Thyrotoxicosis, unspecified without thyrotoxic crisis or storm: Principal | ICD-10-CM

## 2018-11-17 DIAGNOSIS — M19042 Primary osteoarthritis, left hand: Principal | ICD-10-CM

## 2018-11-17 DIAGNOSIS — M797 Fibromyalgia: Principal | ICD-10-CM

## 2018-11-17 DIAGNOSIS — H1850 Unspecified hereditary corneal dystrophies: Principal | ICD-10-CM

## 2018-11-17 DIAGNOSIS — K219 Gastro-esophageal reflux disease without esophagitis: Principal | ICD-10-CM

## 2018-11-17 DIAGNOSIS — M19041 Primary osteoarthritis, right hand: Principal | ICD-10-CM

## 2018-11-17 DIAGNOSIS — J301 Allergic rhinitis due to pollen: Principal | ICD-10-CM

## 2018-11-17 DIAGNOSIS — K582 Mixed irritable bowel syndrome: Principal | ICD-10-CM

## 2018-11-17 DIAGNOSIS — M2170 Unequal limb length (acquired), unspecified site: Principal | ICD-10-CM

## 2018-11-17 DIAGNOSIS — Z602 Problems related to living alone: Principal | ICD-10-CM

## 2018-11-17 DIAGNOSIS — M48061 Spinal stenosis, lumbar region without neurogenic claudication: Principal | ICD-10-CM

## 2018-11-17 DIAGNOSIS — Z7982 Long term (current) use of aspirin: Principal | ICD-10-CM

## 2018-11-17 DIAGNOSIS — H04123 Dry eye syndrome of bilateral lacrimal glands: Principal | ICD-10-CM

## 2018-11-19 MED ORDER — RIZATRIPTAN 5 MG TABLET
ORAL_TABLET | Freq: Every day | ORAL | 2 refills | 0 days | Status: CP | PRN
Start: 2018-11-19 — End: 2019-03-08

## 2018-11-19 MED ORDER — OLMESARTAN 20 MG TABLET
ORAL_TABLET | Freq: Every day | ORAL | 3 refills | 0 days | Status: CP
Start: 2018-11-19 — End: 2019-11-19

## 2018-12-02 ENCOUNTER — Telehealth
Admit: 2018-12-02 | Discharge: 2018-12-03 | Payer: MEDICARE | Attending: Physician Assistant | Primary: Physician Assistant

## 2019-01-29 ENCOUNTER — Ambulatory Visit: Admit: 2019-01-29 | Discharge: 2019-01-30 | Payer: MEDICARE

## 2019-01-29 DIAGNOSIS — H1852 Epithelial (juvenile) corneal dystrophy: Principal | ICD-10-CM

## 2019-01-29 DIAGNOSIS — H04129 Dry eye syndrome of unspecified lacrimal gland: Secondary | ICD-10-CM

## 2019-03-01 ENCOUNTER — Ambulatory Visit: Admit: 2019-03-01 | Discharge: 2019-03-02 | Payer: MEDICARE

## 2019-03-01 DIAGNOSIS — Z8679 Personal history of other diseases of the circulatory system: Secondary | ICD-10-CM

## 2019-03-01 DIAGNOSIS — I059 Rheumatic mitral valve disease, unspecified: Principal | ICD-10-CM

## 2019-03-01 DIAGNOSIS — I10 Essential (primary) hypertension: Secondary | ICD-10-CM

## 2019-03-01 DIAGNOSIS — F39 Unspecified mood [affective] disorder: Secondary | ICD-10-CM

## 2019-03-05 ENCOUNTER — Ambulatory Visit: Admit: 2019-03-05 | Discharge: 2019-03-06 | Payer: MEDICARE

## 2019-03-05 DIAGNOSIS — I059 Rheumatic mitral valve disease, unspecified: Principal | ICD-10-CM

## 2019-03-08 ENCOUNTER — Institutional Professional Consult (permissible substitution): Admit: 2019-03-08 | Discharge: 2019-03-09 | Payer: MEDICARE

## 2019-04-02 ENCOUNTER — Institutional Professional Consult (permissible substitution): Admit: 2019-04-02 | Discharge: 2019-04-03 | Payer: MEDICARE

## 2019-04-02 DIAGNOSIS — K582 Mixed irritable bowel syndrome: Secondary | ICD-10-CM

## 2019-04-02 DIAGNOSIS — F39 Unspecified mood [affective] disorder: Secondary | ICD-10-CM

## 2019-04-02 DIAGNOSIS — I38 Endocarditis, valve unspecified: Principal | ICD-10-CM

## 2019-04-02 DIAGNOSIS — M4802 Spinal stenosis, cervical region: Secondary | ICD-10-CM

## 2019-04-02 DIAGNOSIS — G2581 Restless legs syndrome: Secondary | ICD-10-CM

## 2019-04-02 DIAGNOSIS — W19XXXD Unspecified fall, subsequent encounter: Secondary | ICD-10-CM

## 2019-04-02 DIAGNOSIS — E0590 Thyrotoxicosis, unspecified without thyrotoxic crisis or storm: Secondary | ICD-10-CM

## 2019-04-02 MED ORDER — DICLOFENAC 1 % TOPICAL GEL
Freq: Four times a day (QID) | TOPICAL | 2 refills | 13.00000 days | Status: CP
Start: 2019-04-02 — End: 2020-04-01

## 2019-04-14 ENCOUNTER — Ambulatory Visit: Admit: 2019-04-14 | Discharge: 2019-05-13 | Payer: MEDICARE

## 2019-04-14 ENCOUNTER — Ambulatory Visit
Admit: 2019-04-14 | Discharge: 2019-05-13 | Payer: MEDICARE | Attending: Student in an Organized Health Care Education/Training Program | Primary: Student in an Organized Health Care Education/Training Program

## 2019-04-14 DIAGNOSIS — M1711 Unilateral primary osteoarthritis, right knee: Principal | ICD-10-CM

## 2019-04-14 DIAGNOSIS — M545 Low back pain: Secondary | ICD-10-CM

## 2019-04-14 DIAGNOSIS — M48061 Spinal stenosis, lumbar region without neurogenic claudication: Secondary | ICD-10-CM

## 2019-04-14 DIAGNOSIS — M792 Neuralgia and neuritis, unspecified: Secondary | ICD-10-CM

## 2019-04-14 DIAGNOSIS — M4120 Other idiopathic scoliosis, site unspecified: Secondary | ICD-10-CM

## 2019-04-22 DIAGNOSIS — M545 Low back pain: Secondary | ICD-10-CM

## 2019-04-22 DIAGNOSIS — I359 Nonrheumatic aortic valve disorder, unspecified: Secondary | ICD-10-CM

## 2019-04-22 DIAGNOSIS — Z7409 Other reduced mobility: Secondary | ICD-10-CM

## 2019-04-22 DIAGNOSIS — M1711 Unilateral primary osteoarthritis, right knee: Secondary | ICD-10-CM

## 2019-04-22 DIAGNOSIS — M21379 Foot drop, unspecified foot: Secondary | ICD-10-CM

## 2019-04-22 DIAGNOSIS — M4802 Spinal stenosis, cervical region: Secondary | ICD-10-CM

## 2019-04-22 DIAGNOSIS — R531 Weakness: Secondary | ICD-10-CM

## 2019-04-22 DIAGNOSIS — M4120 Other idiopathic scoliosis, site unspecified: Secondary | ICD-10-CM

## 2019-04-22 DIAGNOSIS — M792 Neuralgia and neuritis, unspecified: Secondary | ICD-10-CM

## 2019-04-22 DIAGNOSIS — R2681 Unsteadiness on feet: Secondary | ICD-10-CM

## 2019-04-22 DIAGNOSIS — K582 Mixed irritable bowel syndrome: Secondary | ICD-10-CM

## 2019-04-22 DIAGNOSIS — M48061 Spinal stenosis, lumbar region without neurogenic claudication: Secondary | ICD-10-CM

## 2019-04-23 ENCOUNTER — Ambulatory Visit: Admit: 2019-04-23 | Discharge: 2019-04-24

## 2019-05-12 ENCOUNTER — Ambulatory Visit: Admit: 2019-05-12 | Discharge: 2019-05-13 | Payer: MEDICARE | Attending: Ophthalmology | Primary: Ophthalmology

## 2019-05-12 DIAGNOSIS — H02413 Mechanical ptosis of bilateral eyelids: Secondary | ICD-10-CM

## 2019-05-20 ENCOUNTER — Ambulatory Visit: Admit: 2019-05-20 | Discharge: 2019-06-12 | Payer: MEDICARE

## 2019-05-20 DIAGNOSIS — M48061 Spinal stenosis, lumbar region without neurogenic claudication: Secondary | ICD-10-CM

## 2019-05-20 DIAGNOSIS — Z7409 Other reduced mobility: Secondary | ICD-10-CM

## 2019-05-20 DIAGNOSIS — M21379 Foot drop, unspecified foot: Secondary | ICD-10-CM

## 2019-05-20 DIAGNOSIS — M4120 Other idiopathic scoliosis, site unspecified: Secondary | ICD-10-CM

## 2019-05-20 DIAGNOSIS — R531 Weakness: Secondary | ICD-10-CM

## 2019-05-20 DIAGNOSIS — M792 Neuralgia and neuritis, unspecified: Secondary | ICD-10-CM

## 2019-05-20 DIAGNOSIS — R2681 Unsteadiness on feet: Secondary | ICD-10-CM

## 2019-05-20 DIAGNOSIS — M4802 Spinal stenosis, cervical region: Secondary | ICD-10-CM

## 2019-05-20 DIAGNOSIS — M1711 Unilateral primary osteoarthritis, right knee: Secondary | ICD-10-CM

## 2019-05-20 DIAGNOSIS — M545 Low back pain: Secondary | ICD-10-CM

## 2019-05-20 DIAGNOSIS — I359 Nonrheumatic aortic valve disorder, unspecified: Secondary | ICD-10-CM

## 2019-05-20 DIAGNOSIS — K582 Mixed irritable bowel syndrome: Secondary | ICD-10-CM

## 2019-05-21 DIAGNOSIS — K6389 Other specified diseases of intestine: Secondary | ICD-10-CM

## 2019-05-21 MED ORDER — DOXYCYCLINE HYCLATE 100 MG TABLET
ORAL_TABLET | Freq: Two times a day (BID) | ORAL | 5 refills | 10 days | Status: CP
Start: 2019-05-21 — End: 2019-05-31

## 2019-05-24 DIAGNOSIS — Z1231 Encounter for screening mammogram for malignant neoplasm of breast: Secondary | ICD-10-CM

## 2019-05-25 ENCOUNTER — Ambulatory Visit: Admit: 2019-05-25 | Discharge: 2019-05-26 | Payer: MEDICARE

## 2019-05-25 DIAGNOSIS — Z23 Encounter for immunization: Secondary | ICD-10-CM

## 2019-05-27 DIAGNOSIS — M4802 Spinal stenosis, cervical region: Secondary | ICD-10-CM

## 2019-05-27 DIAGNOSIS — M48061 Spinal stenosis, lumbar region without neurogenic claudication: Secondary | ICD-10-CM

## 2019-05-27 DIAGNOSIS — M21371 Foot drop, right foot: Secondary | ICD-10-CM

## 2019-05-27 DIAGNOSIS — R531 Weakness: Secondary | ICD-10-CM

## 2019-05-27 DIAGNOSIS — K582 Mixed irritable bowel syndrome: Secondary | ICD-10-CM

## 2019-05-27 DIAGNOSIS — M1711 Unilateral primary osteoarthritis, right knee: Secondary | ICD-10-CM

## 2019-05-27 DIAGNOSIS — M792 Neuralgia and neuritis, unspecified: Secondary | ICD-10-CM

## 2019-05-27 DIAGNOSIS — M545 Low back pain: Secondary | ICD-10-CM

## 2019-05-27 DIAGNOSIS — Z7409 Other reduced mobility: Secondary | ICD-10-CM

## 2019-05-27 DIAGNOSIS — I359 Nonrheumatic aortic valve disorder, unspecified: Secondary | ICD-10-CM

## 2019-05-27 DIAGNOSIS — R2681 Unsteadiness on feet: Secondary | ICD-10-CM

## 2019-05-27 DIAGNOSIS — M4120 Other idiopathic scoliosis, site unspecified: Secondary | ICD-10-CM

## 2019-05-27 DIAGNOSIS — M21379 Foot drop, unspecified foot: Secondary | ICD-10-CM

## 2019-06-14 ENCOUNTER — Ambulatory Visit: Admit: 2019-06-14 | Discharge: 2019-06-15 | Payer: MEDICARE

## 2019-06-15 ENCOUNTER — Ambulatory Visit: Admit: 2019-06-15 | Discharge: 2019-07-12 | Payer: MEDICARE

## 2019-07-05 ENCOUNTER — Encounter: Admit: 2019-07-05 | Discharge: 2019-07-05 | Payer: MEDICARE | Attending: General Practice | Primary: General Practice

## 2019-07-23 ENCOUNTER — Ambulatory Visit: Admit: 2019-07-23 | Discharge: 2019-08-11 | Payer: MEDICARE

## 2019-08-09 ENCOUNTER — Ambulatory Visit: Admit: 2019-08-09 | Discharge: 2019-08-10 | Payer: MEDICARE

## 2019-08-19 DIAGNOSIS — I10 Essential (primary) hypertension: Principal | ICD-10-CM

## 2019-08-22 MED ORDER — AMLODIPINE 5 MG TABLET
ORAL_TABLET | Freq: Two times a day (BID) | ORAL | 3 refills | 90 days | Status: CP
Start: 2019-08-22 — End: ?

## 2019-09-14 MED ORDER — KETOPROFEN (BULK) 100 % POWDER
6 refills | 0 days | Status: CP
Start: 2019-09-14 — End: ?

## 2019-09-22 ENCOUNTER — Ambulatory Visit: Admit: 2019-09-22 | Discharge: 2019-09-23 | Payer: MEDICARE

## 2019-09-22 DIAGNOSIS — Z780 Asymptomatic menopausal state: Principal | ICD-10-CM

## 2019-09-22 DIAGNOSIS — R209 Unspecified disturbances of skin sensation: Principal | ICD-10-CM

## 2019-09-22 DIAGNOSIS — E0590 Thyrotoxicosis, unspecified without thyrotoxic crisis or storm: Principal | ICD-10-CM

## 2019-09-22 DIAGNOSIS — I10 Essential (primary) hypertension: Principal | ICD-10-CM

## 2019-09-22 DIAGNOSIS — M810 Age-related osteoporosis without current pathological fracture: Principal | ICD-10-CM

## 2019-09-22 DIAGNOSIS — R51 Headache: Principal | ICD-10-CM

## 2019-09-22 MED ORDER — AMLODIPINE 5 MG TABLET
ORAL_TABLET | Freq: Every day | ORAL | 4 refills | 90 days | Status: CP
Start: 2019-09-22 — End: 2019-12-21

## 2019-09-22 MED ORDER — RIZATRIPTAN 5 MG DISINTEGRATING TABLET
ORAL_TABLET | Freq: Once | ORAL | 0 refills | 0.00000 days | Status: CP | PRN
Start: 2019-09-22 — End: ?

## 2019-10-01 ENCOUNTER — Ambulatory Visit: Admit: 2019-10-01 | Discharge: 2019-10-02 | Payer: MEDICARE

## 2019-10-14 ENCOUNTER — Ambulatory Visit: Admit: 2019-10-14 | Discharge: 2019-10-15 | Payer: MEDICARE

## 2019-10-22 ENCOUNTER — Ambulatory Visit: Admit: 2019-10-22 | Discharge: 2019-10-23 | Payer: MEDICARE

## 2019-11-09 MED ORDER — OLMESARTAN 20 MG TABLET
ORAL_TABLET | 3 refills | 0 days | Status: CP
Start: 2019-11-09 — End: ?

## 2019-11-17 ENCOUNTER — Ambulatory Visit: Admit: 2019-11-17 | Discharge: 2019-11-17 | Payer: MEDICARE

## 2019-11-17 DIAGNOSIS — M170 Bilateral primary osteoarthritis of knee: Principal | ICD-10-CM

## 2019-11-17 MED ORDER — AMLODIPINE 5 MG TABLET
ORAL_TABLET | Freq: Every day | ORAL | 4 refills | 90 days | Status: CP
Start: 2019-11-17 — End: 2020-02-15

## 2019-11-17 MED ORDER — CARBIDOPA 25 MG-LEVODOPA 100 MG TABLET
ORAL_TABLET | Freq: Every evening | ORAL | 3 refills | 90 days | Status: CP | PRN
Start: 2019-11-17 — End: ?

## 2019-11-26 ENCOUNTER — Ambulatory Visit: Admit: 2019-11-26 | Discharge: 2019-11-27 | Payer: MEDICARE

## 2019-11-30 ENCOUNTER — Ambulatory Visit
Admit: 2019-11-30 | Discharge: 2019-12-01 | Payer: MEDICARE | Attending: Physician Assistant | Primary: Physician Assistant

## 2019-11-30 DIAGNOSIS — K6389 Other specified diseases of intestine: Principal | ICD-10-CM

## 2019-11-30 DIAGNOSIS — K5902 Outlet dysfunction constipation: Principal | ICD-10-CM

## 2019-11-30 MED ORDER — DOXYCYCLINE HYCLATE 100 MG TABLET
ORAL_TABLET | Freq: Two times a day (BID) | ORAL | 5 refills | 10.00000 days | Status: CP
Start: 2019-11-30 — End: 2019-12-10

## 2019-12-14 ENCOUNTER — Ambulatory Visit: Admit: 2019-12-14 | Discharge: 2020-01-12 | Payer: MEDICARE

## 2019-12-29 ENCOUNTER — Ambulatory Visit: Admit: 2019-12-29 | Discharge: 2019-12-30 | Payer: MEDICARE | Attending: Adult Health | Primary: Adult Health

## 2019-12-29 DIAGNOSIS — F418 Other specified anxiety disorders: Principal | ICD-10-CM

## 2019-12-29 DIAGNOSIS — R0789 Other chest pain: Principal | ICD-10-CM

## 2020-01-03 ENCOUNTER — Ambulatory Visit: Admit: 2020-01-03 | Discharge: 2020-01-04 | Payer: MEDICARE

## 2020-01-21 ENCOUNTER — Ambulatory Visit: Admit: 2020-01-14 | Payer: MEDICARE

## 2020-01-21 ENCOUNTER — Ambulatory Visit: Admit: 2020-01-21 | Payer: MEDICARE

## 2020-01-28 ENCOUNTER — Ambulatory Visit: Admit: 2020-01-28 | Payer: MEDICARE

## 2020-01-28 ENCOUNTER — Ambulatory Visit
Admit: 2020-01-28 | Discharge: 2020-02-05 | Disposition: A | Discharge status: Other Acute Inpatient Hospital | Payer: MEDICARE

## 2020-02-04 MED ORDER — PSEUDOEPHEDRINE ER 120 MG TABLET,EXTENDED RELEASE
Freq: Two times a day (BID) | ORAL | 0 refills | 0 days | Status: SS | PRN
Start: 2020-02-04 — End: ?

## 2020-02-04 MED ORDER — OLANZAPINE 5 MG DISINTEGRATING TABLET
Freq: Every day | ORAL | 0 refills | 0 days | Status: SS | PRN
Start: 2020-02-04 — End: 2020-03-05

## 2020-02-05 ENCOUNTER — Ambulatory Visit
Admission: TF | Admit: 2020-02-05 | Discharge: 2020-03-01 | Disposition: A | Discharge status: Home / Self Care | Payer: MEDICARE | Source: Intra-hospital | Admitting: Psychiatry

## 2020-02-05 MED ORDER — OLANZAPINE 5 MG DISINTEGRATING TABLET
ORAL_TABLET | Freq: Every day | ORAL | 0 refills | 30 days | Status: SS
Start: 2020-02-05 — End: 2020-03-06

## 2020-02-28 DIAGNOSIS — F259 Schizoaffective disorder, unspecified: Principal | ICD-10-CM

## 2020-02-28 DIAGNOSIS — R410 Disorientation, unspecified: Principal | ICD-10-CM

## 2020-03-01 MED ORDER — LIDOCAINE 5 % TOPICAL PATCH
MEDICATED_PATCH | Freq: Every evening | TRANSDERMAL | 0 refills | 30 days | Status: CP
Start: 2020-03-01 — End: 2020-03-31

## 2020-03-01 MED ORDER — CARBOXYMETHYLCELLULOSE SODIUM 0.25 % EYE DROPS
OPHTHALMIC | 0 refills | 9 days | Status: CP | PRN
Start: 2020-03-01 — End: ?

## 2020-03-01 MED ORDER — ACETAMINOPHEN 500 MG TABLET
ORAL_TABLET | Freq: Two times a day (BID) | ORAL | 0 refills | 15.00000 days | Status: CP | PRN
Start: 2020-03-01 — End: ?

## 2020-03-01 MED ORDER — MELATONIN 3 MG TABLET
ORAL_TABLET | Freq: Every evening | ORAL | 0 refills | 30 days | Status: CP
Start: 2020-03-01 — End: 2020-03-31

## 2020-03-01 MED ORDER — ACETAMINOPHEN 500 MG TABLET: 1000 mg | tablet | Freq: Three times a day (TID) | 0 refills | 5 days | Status: AC

## 2020-03-02 ENCOUNTER — Ambulatory Visit: Admit: 2020-03-02 | Discharge: 2020-03-03 | Payer: MEDICARE

## 2020-03-02 DIAGNOSIS — E0590 Thyrotoxicosis, unspecified without thyrotoxic crisis or storm: Principal | ICD-10-CM

## 2020-03-02 DIAGNOSIS — I10 Essential (primary) hypertension: Principal | ICD-10-CM

## 2020-03-02 DIAGNOSIS — M545 Low back pain: Principal | ICD-10-CM

## 2020-03-02 DIAGNOSIS — K582 Mixed irritable bowel syndrome: Principal | ICD-10-CM

## 2020-03-02 DIAGNOSIS — M1711 Unilateral primary osteoarthritis, right knee: Principal | ICD-10-CM

## 2020-03-02 MED ORDER — AMLODIPINE 5 MG TABLET
ORAL_TABLET | Freq: Every day | ORAL | 3 refills | 90.00000 days | Status: CP
Start: 2020-03-02 — End: 2021-03-02

## 2020-03-02 MED ORDER — VALSARTAN 80 MG TABLET
ORAL_TABLET | Freq: Every day | ORAL | 0 refills | 30.00000 days | Status: CP
Start: 2020-03-02 — End: 2020-04-01

## 2020-03-02 MED ORDER — GLYCERIN (ADULT) RECTAL SUPPOSITORY
Freq: Every day | RECTAL | 1 refills | 15 days | Status: CP | PRN
Start: 2020-03-02 — End: ?

## 2020-03-10 ENCOUNTER — Ambulatory Visit: Admit: 2020-03-10 | Discharge: 2020-03-11 | Payer: MEDICARE

## 2020-03-10 MED ORDER — OLMESARTAN 20 MG TABLET
ORAL_TABLET | Freq: Every day | ORAL | 3 refills | 90 days | Status: CP
Start: 2020-03-10 — End: 2021-03-10

## 2020-03-10 NOTE — Unmapped (Addendum)
Patient confined to one room of house currently and would benefit from bedside commode for due to joint problems and to improve safety.    ASSESSMENT/PLAN:    Problem List Items Addressed This Visit        Digestive    Irritable bowel syndrome with both constipation and diarrhea     Reports mild GI bleed prior to hospitalization, no recurrence. She plans to follow up with her GI and I agree with this plan. No changes made today.         Relevant Medications    magnesium oxide, bulk, 100 % Powd       Endocrine    Goiter, non-toxic    Relevant Orders    Ambulatory referral to Endocrinology    Subclinical hyperthyroidism     Was followed by outside endocrinologist Cindy [name], but she would like to be seen at [facility] going forward. While her history/labs have been consistent with subclinical hyperthyroidism, she had been started on levothyroxine by her endocrinologist. This was stopped during hospitalization. Her labs on 7/7 were still consistent with subclinical hyperthyroidism so will continue to hold and recheck thyroid labs in 1 month.         Relevant Orders    Ambulatory referral to Endocrinology       Cardiovascular and Mediastinum    Hypertension, benign - Primary     BP at goal today. She is back on olmesartan from valsartan as the second was not affordable. Continues on her amlodipine as well. No changes made today she should continue both these meds.          Relevant Medications    olmesartan (BENICAR) 20 MG tablet       Musculoskeletal and Integument    Osteoarthrosis     I think she could benefit from knee replacement, but we discussed deferring this for right now until we get her other health conditions more stable/controlled.         Relevant Orders    Ambulatory referral to Home Health    Osteoporosis     Untreated. Has been followed by her endocrinologist. She does not have a contraindication to bisphosphontes, but in the past has been against trying these due to things she heard from her dentist. I have communicated with her dentist [facility]) and also reviewed guidelines he sent me and reviewed with her today that as long as she does not need dental work in the near future that treating with bisphosphonates would be ok with her dentist. Per her daughter and the patient she may now need some dental work so would defer starting this until after eval with her dentist, when no further work needed would consider starting the bisphosphonate.          Relevant Orders    Ambulatory referral to Endocrinology    Scoliosis, or kyphoscoliosis, idiopathic     She would benefit from continued physical therapy. Currently homebound and would benefit from [facility]. Will refer back to [facility] and try to advocate for access to this resource. Dr. Milana Na spoke with them about PACE which I think could be good for her, but she is not ready to transition her care from [facility] at this time.   There was concern that she needs further testing and/or driving rehab prior to driving again. Reviewed OT eval and it notes that her TUG test is very slow which puts her at risk of unsafe driving. Gave patient/daughter the driving rehab  recs from the OT in the [facility] today. We will start with [facility] PT/OT and then progress to outpaient as tolerated.          Relevant Orders    Ambulatory referral to Home Health       Other    Dry eyes due to decreased tear production     She has questions about her eyes and whether she will need any procedures on either eye. On review of last note from 07/2019 no mention of need for any procedures, but encouraged them to get appt with her eye doctor to see if their recommendations have changed.          Schizoaffective disorder (CMS-HCC)     [facility] will be establishing with [facility] soon. We discussed that especially before she establishes with them that she could be seen in our clinic if she or her daughter had acute concerns for her mental health as part of a safety plan. Reviewed that even if I wasn;t available she could see one of my team providers and that we have access to our social work team that can help with acute safety planning if needed during a visit. The patient and daughter are happy with that plan.            Other Visit Diagnoses     Physical deconditioning        Relevant Orders    Ambulatory referral to Home Health             HEALTH MAINTENANCE ITEMS STILL DUE:  There are no preventive care reminders to display for this patient.    Follow-up: Return for follow up in one month for thyroid and follow up (40 min visit).    Future Appointments   Date Time Provider Department Center   03/16/2020  2:00 PM Antony Contras [place], PMHNP [facility]   04/12/2020  2:45 PM Delorse Lek, MD [facility]     I personally spent 61 minutes face-to-face and non-face-to-face in the care of this patient, which includes all pre, intra, and post visit time on the date of service.    Chief Complaint   Patient presents with   ??? Hospitalization Follow-up     GI bleed        SUBJECTIVE:    Cindy [name] is a 79 y.o. female that presents to clinic today regarding the following issues:  -blood pressure (no valsartan in 1 week) but has gone back to taking her olemsartan. She is worried about it being high here today.  -thyroid stopped in the hopsital. She would like to transfer her endocrine care to [facility]. Has hx of a goiter. Does not want to take the injection/infusion for her osteoporosis. Daughter/pt think she may need more dental work now.   -had bleed right before the hospital, 3-4 days, bright red stringy blood. Was probably having constipation. Wants to know if she will need another colonoscopy.   -establishing with psychiatry next week. They want to make sure they have a plan for worsening mood symptoms prior to that visit. Pt reports she is sometimes in denial about her symptoms.     Review of Systems as above      Cindy [name]  reports that she has never smoked. She has never used smokeless tobacco. OBJECTIVE:    Ms. Mullany  height is 153 cm (5' 0.24) and weight is 61.3 kg (135 lb 3.2 oz). Her temporal temperature is 35.6 ??C (96 ??  F). Her blood pressure is 130/77 and her pulse is 82.   Wt Readings from Last 3 Encounters:   03/10/20 61.3 kg (135 lb 3.2 oz)   03/02/20 61.7 kg (136 lb)   02/27/20 62 kg (136 lb 11 oz)      PHQ-9 PHQ-9 TOTAL SCORE   02/26/2020 8   02/20/2020 5   02/13/2020 13   09/10/2017 2     PHQ-2 Score:      PHQ-9 Score:      Edinburgh Score:      GAD-7 Score:     Physical Exam  Constitutional:       General: She is not in acute distress.     Appearance: She is not ill-appearing.   Pulmonary:      Effort: Pulmonary effort is normal.   Neurological:      Mental Status: She is alert.   Psychiatric:      Comments: Tearful at times. Tangential but redirectable.

## 2020-03-13 ENCOUNTER — Encounter: Admit: 2020-03-13 | Discharge: 2020-04-11 | Payer: MEDICARE

## 2020-03-13 ENCOUNTER — Inpatient Hospital Stay: Admit: 2020-03-10 | Discharge: 2020-04-11 | Payer: MEDICARE

## 2020-03-16 ENCOUNTER — Ambulatory Visit
Admit: 2020-03-16 | Discharge: 2020-03-17 | Payer: MEDICARE | Attending: Psychiatric/Mental Health | Primary: Psychiatric/Mental Health

## 2020-03-16 DIAGNOSIS — F259 Schizoaffective disorder, unspecified: Principal | ICD-10-CM

## 2020-03-16 DIAGNOSIS — R410 Disorientation, unspecified: Principal | ICD-10-CM

## 2020-03-16 MED ORDER — ATROPINE 0.01 % EYE DROP EMULSION
0 refills | 0 days | Status: CP
Start: 2020-03-16 — End: ?
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
## 2020-03-20 MED ORDER — ATROPINE 1 % EYE DROPS
0 refills | 0 days | Status: CP
Start: 2020-03-20 — End: ?

## 2020-03-21 MED ORDER — IPRATROPIUM BROMIDE 17 MCG/ACTUATION HFA AEROSOL INHALER
0 refills | 0 days | Status: CP
Start: 2020-03-21 — End: ?

## 2020-03-21 MED ORDER — INVEGA SUSTENNA 117 MG/0.75 ML INTRAMUSCULAR SYRINGE
INTRAMUSCULAR | 0 refills | 28 days | Status: CP
Start: 2020-03-21 — End: ?
  Filled 2020-03-27: qty 0.75, 28d supply, fill #0

## 2020-03-22 DIAGNOSIS — M1711 Unilateral primary osteoarthritis, right knee: Principal | ICD-10-CM

## 2020-03-22 NOTE — Unmapped (Unsigned)
This [name] is receiving [name] as a clinic administered medication. [name] is aware of copay of $9.20. Cindy [name] prefers all future communication to occur with Cindy clinic. This [name] is being disenrolled from our specialty management program and will be added to a [name] list for appropriate follow up.          [facility]   [name] Onboarding/Medication Counseling    Ms.Cindy [name] is a 79 y.o. female with schizoaffective disorder who I am counseling today on continuation of therapy.  I am speaking to {Blank:19197::Cindy [name],Cindy [name]'s caregiver, ***,Cindy [name]'s family member, ***,***}.    Was a Nurse, learning disability used for this call? No    Verified [name]'s date of birth / HIPAA.    Specialty medication(s) to be sent: General Specialty: [name]      Non-specialty medications/supplies to be sent: none      Medications not needed at this time: none         [place] (Paliperidone palmitate)    Medication & Administration     Dosage:   ??? Initiation: 234 mg (150 mg as base) on treatment day 1 followed by 156 mg (100 mg as base) 1 week later, with both doses administered in Cindy deltoid muscle. Cindy second dose may be administered 4 days before or after Cindy 1-week time point.  ??? Maintenance: Five weeks after Cindy first initiation dose, begin a maintenance dose of 39 to 234 mg (25 to 150 mg as base) every month administered in either Cindy deltoid or gluteal muscle.    Administration: in clinic administration ??? IM injection    Adherence/Missed dose instructions: Contact your provider    Goals of Therapy     ??? treat schizophrenia.  ??? treat schizoaffective disorder.    Side Effects & Monitoring Parameters     Common side effects:  ??? Headache.  ??? Weight gain.  ??? Constipation.  ??? Feeling dizzy, sleepy, tired, or weak.  ??? Anxiety.  ??? Upset stomach or throwing up.  ??? Nose or throat irritation.  ??? Restlessness  ??? Irritation at injection site    Cindy following side effects should be reported to Cindy provider:  ??? Signs of an allergic reaction (rash; hives; itching; wheezing; tightness in Cindy chest or throat; trouble breathing, swallowing, or talking; unusual hoarseness; or swelling of Cindy mouth, face, lips, tongue, or throat)  ??? Signs of high blood sugar (confusion, feeling sleepy, more thirst, more hungry, passing urine more often, flushing, fast breathing, or breath that smells like fruit)  ??? Very bad dizziness or passing out.  ??? Change in how you act; mood changes.  ??? Shakiness, trouble moving around, or stiffness.  ??? Slow heartbeat.  ??? Trouble swallowing.  ??? Seizures.  ??? More saliva.  ??? Any unexplained bruising or bleeding.  ??? Not able to control eye movements.  ??? Not sweating during activities or in warm temperatures.  ??? Breast pain.  ??? Enlarged breasts, nipple discharge, not able to get or keep an erection (in males), or period (menstrual) changes (in females).  ??? Call your doctor right away if you have a painful erection (hard penis) or an erection that lasts for longer than 4 hours.   ??? Fever, muscle cramps or stiffness, dizziness, very bad headache, confusion, change in thinking, fast heartbeat, heartbeat that does not feel normal, or are sweating a lot.  ??? Fast heartbeat, a heartbeat that does not feel normal, or if you pass out.  Contraindications, Warnings, & Precautions     ??? routine use during pregnancy is not recommended. However, if a woman is inadvertently exposed to an atypical antipsychotic while pregnant, continuing therapy may be preferable to switching to a typical antipsychotic that Cindy fetus has not yet been exposed to; consider risk: benefit (ACOG 2008).    Drug/Food Interactions     ??? Medication list reviewed in Epic. Cindy [name] was instructed to inform Cindy care team before taking any new medications or supplements. No drug interactions identified.   ??? Do not use Azelastine Nasal Spray with this     Storage, Handling Precautions, & Disposal     ??? Store at atypical antipsychotic while pregnant, continuing therapy may be preferable to switching to a typical antipsychotic that Cindy fetus has not yet been exposed to; consider risk: benefit (ACOG 2008).    Drug/Food Interactions     ??? Medication list reviewed in Epic. Cindy [name] was instructed to inform Cindy care team before taking any new medications or supplements. No drug interactions identified.   ??? Do not use Azelastine Nasal Spray with this     Storage, Handling Precautions, & Disposal     ??? Store at room temperature in dry condition  ??? Keep all drugs in a safe place. Keep all drugs out of Cindy reach of children and pets.  ??? Throw away unused or expired drugs. Do not flush down a toilet or pour down a drain          Current Medications (including OTC/herbals), Comorbidities and Allergies     Current Outpatient Medications   Medication Sig Dispense Refill   ??? acetaminophen (TYLENOL) 500 MG tablet Take 2 tablets (1,000 mg total) by mouth Three (3) times a day. 30 tablet 0   ??? amLODIPine (NORVASC) 5 MG tablet Take 1 tablet (5 mg total) by mouth daily. 90 tablet 3   ??? atropine 1 % ophthalmic solution 1-2 drops under Cindy tongue per day 2 mL 0   ??? carboxymethylcellulose sodium (THERATEARS) 0.25 % Drop Administer 2 drops to both eyes every two (2) hours as needed. 10 mL 0   ??? cholecalciferol, vitamin D3, (VITAMIN D3 ORAL) Take 1 tablet by mouth daily.      ??? fish oil-omega-3 fatty acids (FISH OIL) 300-1,000 mg capsule Take 2 g by mouth daily.     ??? glycerin, adult, Supp Insert 1 suppository into Cindy rectum daily as needed (if bowels are hard.). 15 suppository 1   ??? ipratropium (ATROVENT HFA) 17 mcg/actuation inhaler Spray 2 puffs under Cindy tongue up to 4 times a day as needed for sialorrhea 12.9 g 0   ??? Lactobacillus rhamnosus GG (CULTURELLE) 10 billion cell capsule Take 1 capsule by mouth daily.      ??? lidocaine (LIDODERM) 5 % patch Place 1 patch on Cindy skin nightly. Apply to affected area for 12 hours only each day (then remove patch) 30 patch 0   ??? magnesium oxide, bulk, 100 % Powd Takes OTC  0   ??? melatonin 3 mg Tab Take 1 tablet (3 mg total) by mouth every evening. 30 tablet 0   ??? olmesartan (BENICAR) 20 MG tablet Take 1 tablet (20 mg total) by mouth daily. 90 tablet 3   ??? paliperidone palmitate (INVEGA SUSTENNA) 117 mg/0.75 mL Syrg Inject Cindy contents of 1 syringe (117 mg total) into Cindy muscle every twenty-eight (28) days. 0.75 mL 0   ??? zinc oxide 22 % Crea Apply 1 application topically  daily. for skin protection from Cindy sun       No current facility-administered medications for this visit.       Allergies   Allergen Reactions   ??? Carbamazepine Anaphylaxis, Hives and Other (See Comments)     Other Reaction: Other reaction  Pt has taken flexeril after carbamazapine w/ no reaction.   ??? Other Hives and Other (See Comments)     Uncoded Allergy. Allergen: Shellfish, Other Reaction: shrimp=gi upset   ??? Amitiza [Lubiprostone] Other (See Comments)     Dizziness     ??? Aspirin Other (See Comments)     Ringing in Cindy ears    ??? Ciprofloxacin Other (See Comments)   ??? Hctz [Hydrochlorothiazide]    ??? Prednisone Other (See Comments)   ??? Raloxifene Other (See Comments)     Blurred vision and headaches   ??? Shellfish Containing Products      Other reaction(s): VOMITING   ??? Baclofen Nausea Only     Dizziness     ??? Bismuth Subsalicylate Tinnitus   ??? Escitalopram Rash       [name] Active Problem List   Diagnosis   ??? Abnormality of gait   ??? Hypertension, benign   ??? Dermatitis   ??? Goiter, non-toxic   ??? Osteoarthrosis   ??? Osteoporosis   ??? Pain in soft tissues of limb   ??? Neuralgia and neuritis   ??? Restless legs syndrome   ??? Irritable bowel syndrome with both constipation and diarrhea   ??? Food intolerance in adult   ??? Schizoaffective disorder (CMS-HCC)   ??? Chronic headaches   ??? Dystonia   ??? Excessive daytime sleepiness   ??? Fall   ??? Subclinical hyperthyroidism   ??? Lumbosacral pain   ??? Dry eyes due to decreased tear production   ??? Acquired unequal leg length   ??? Seasonal allergic rhinitis due to pollen   ??? Anterior basement membrane dystrophy   ??? Valvular heart disease   ??? Spinal stenosis of cervical region   ??? Pes planus   ??? Scoliosis, or kyphoscoliosis, idiopathic   ??? Delirium       Reviewed and up to date in Epic.    Appropriateness of Therapy     Is medication and dose appropriate based on diagnosis? Yes    Prescription has been clinically reviewed: Yes    Baseline Quality of Life Assessment      How many days over Cindy past month did your condition  keep you from your normal activities? For example, brushing your teeth or getting up in Cindy morning. [name] declined to answer    Financial Information     Medication Assistance provided: None Required    Anticipated copay of $9.20 reviewed with [name]. Verified delivery address.    Delivery Information     Scheduled delivery date: 03/27/20    Expected start date: 03/29/20    Medication will be delivered via [facility] to Cindy prescription address in [place].  This shipment will not require a signature.      Explained Cindy services we provide at [facility] and that each month we would call to set up refills.  Stressed importance of returning phone calls so that we could ensure they receive their medications in time each month.  Informed [name] that we should be setting up refills 7-10 days prior to when they will run out of medication.  A pharmacist will reach out to perform  a clinical assessment periodically.  Informed [name] that a welcome packet and a drug information handout will be sent.      [name] verbalized understanding of Cindy above information as well as how to contact Cindy pharmacy at [phone] option 4 with any questions/concerns.  Cindy pharmacy is open Monday through Friday 8:30am-4:30pm.  A pharmacist is available 24/7 via pager to answer any clinical questions they may have.    [name] Specific Needs     - Does Cindy [name] have any physical, language is  {Blank single:19197::English,Spanish,***}     - Is Cindy [name] high risk? {sschighriskpts:78327}    - Does Cindy [name] require a Care Management Plan? {Blank single:19197::No,Yes}     - Does Cindy [name] require physician intervention or other additional services (i.e. nutrition, smoking cessation, social work)? {Blank single:19197::No,Yes - ***}      Arnold Long  [facility] Specialty Pharmacist

## 2020-03-22 NOTE — Unmapped (Signed)
[facility] Specialty Medication Onboarding    Specialty Medication: Hinda Glatter  Prior Authorization: Not Required   Financial Assistance: No - copay  <$25  Final Copay/Day Supply: $9.20 / 28    Insurance Restrictions: None     Notes to Pharmacist: Provider comment on RX, Please deliver before 03/28/2020 to [facility] [address] [place] [phone] with any questions. Thank you    The triage team has completed the benefits investigation and has determined that the patient is able to fill this medication at [facility]. Please contact the patient to complete the onboarding or follow up with the prescribing physician as needed.

## 2020-03-27 DIAGNOSIS — M4120 Other idiopathic scoliosis, site unspecified: Principal | ICD-10-CM

## 2020-03-27 DIAGNOSIS — R5381 Other malaise: Principal | ICD-10-CM

## 2020-03-27 DIAGNOSIS — M1711 Unilateral primary osteoarthritis, right knee: Principal | ICD-10-CM

## 2020-03-27 MED FILL — INVEGA SUSTENNA 117 MG/0.75 ML INTRAMUSCULAR SYRINGE: 28 days supply | Qty: 1 | Fill #0 | Status: AC

## 2020-03-27 NOTE — Unmapped (Signed)
Dear Dr. Sylvie Farrier Rietz,MD       This is to inform you that [facility] havent  been unable to cotnact your patient for scheduling after several attempts.     Please have the patient contact Central Intake [phone]         Thank you so much for trusting [place] with your patient,  Nate

## 2020-03-28 ENCOUNTER — Institutional Professional Consult (permissible substitution): Admit: 2020-03-28 | Discharge: 2020-03-29 | Payer: MEDICARE

## 2020-03-28 MED ADMIN — paliperidone palmitate (INVEGA SUSTENNA) 117 mg/0.75 mL injection 117 mg: 117 mg | INTRAMUSCULAR | @ 15:00:00 | Stop: 2020-03-28

## 2020-03-28 NOTE — Unmapped (Signed)
Error

## 2020-03-28 NOTE — Unmapped (Signed)
Administered long-acting injectable today, 03/28/2020. See MAR for additional documentation. Cindy [name]

## 2020-03-29 ENCOUNTER — Ambulatory Visit
Admit: 2020-03-29 | Discharge: 2020-03-30 | Payer: MEDICARE | Attending: Psychiatric/Mental Health | Primary: Psychiatric/Mental Health

## 2020-03-29 MED ORDER — BUPROPION HCL XL 150 MG 24 HR TABLET, EXTENDED RELEASE
ORAL_TABLET | Freq: Every day | ORAL | 1 refills | 30.00000 days | Status: CP
Start: 2020-03-29 — End: 2020-05-28

## 2020-03-29 NOTE — Unmapped (Signed)
[facility]  Psychiatry   Established Patient E&M Service - Outpatient       Assessment:    Cindy [name] presents for follow-up evaluation.     Identifying Information:  Cindy [name] is a 79 y.o. female with a history of PPD, schizoaffective disorder and hyperthyroidism who was admitted involuntarily to the [facility] unit 6/18- 03/01/2020, who presents for evaluation of mood, medication and to establish care. . Endorsing depression related to recent decline of physical abilities. This is Cindy [name] first outing in a wheel chair. Today, Cindy [name] is endorsing anhedonia, low motivation and mood, and sialorrhea that started after receiving [place] injection.    Risk Assessment:  A full psychiatric risk assessment was conducted on 03/16/2020 and risks do not appear significantly changed from that visit.   While future psychiatric events cannot be accurately predicted, the patient does not currently require acute inpatient psychiatric care and does not currently meet [facility] involuntary commitment criteria.      Plan:    Problem: Schizoaffective disorder  Status of problem: chronic and stable  Interventions: Continue Invega Sustenna from 156 mg to 117 mg q 28 days. Last injection 03/28/20  Discussed possible risk of side effects of Invega including hyperglycemia, NMS, seizures, EPS, hyperprolactinemia, diabetes, dyslipidemia, tardive dyskinesia, sedation, hyper salivation, orthostatic hypotension, tachycardia, injection site reactions.   - considered adding antidepressant to help support depressed mood    Problem: Sialorrhea  Status of problem: improved or improving  Interventions: continue ipratropium bromide SL 1-2 sprays up to 4 x per day  Discussed possible side effects of ipratropium bromide including hypersensitivity reactions, bronchospasm, bladder neck obstruction, intraocular pressure, prostatic hyperplasia, urinary retention, abnormal taste in mouth, MI, dry nasal mucosa, sinusitis, CVA    Problem: Depression  Status of problem:  not improving as expected  Interventions: - start bupropion xl 150 mgDiscussed possible risk of side effects of bupropion including seizure, induction of mania and SI, anorexia, insomnia, dizziness, HA, agitation, hypertension, tremor, increased anxiety.     Problem: r/o Mild neurocognitive impairment  Status of problem:  new problem to this provider  Interventions: MOCA performed 02/11/20 score:??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 Will repeat at future follow up to reevaluate.     Problem: Insomina  Status of problem:  chronic and stable  Interventions: - continue melatonin 3 mg OTC    Problem: Hx of medication side effects  Status of problem:  new problem to this provider  Interventions:Genesight test 03/16/2020 uploaded under media tab    Risks/benefits and indications for treatment with medications above were discussed with the patient. The patient asked appropriate questions, acknowledged understanding of answers, and provided informed consent to initiation&??continuation of medications above.??  ??  Return to clinic appointment: in 3-4 weeks or earlier as needed by patient         Psychotherapy provided:  No billable psychotherapy service provided but brief supportive therapy was utilized.    Patient has been given this writer's contact information as well as the [facility] Psychiatry urgent line number. The patient has been instructed to call 911 for emergencies.          Subjective:    Interval History:   Renewed her drivers license this morning. Doctor has reqeuested an assessment from PT and OT. Does not want [place] to walk and feels this limitation is very scary and challenging. Found a [facility] chronic disease support group but was not accepted. Bupropion has been helpful many times  in the past for depressive episodes. Requesting to restart today to address low mood, low motivation and anhedonia.     Past medication history: Fluoxetine (not effective), tegretol (anaphylaxis), lithium (dystonia), paliperidone (hypersalivation)       Objective:    Mental Status Exam:  Appearance:    Appears stated age and Clean/Neat   Motor:   No abnormal movements   Speech/Language:    Normal rate, volume, tone, fluency   Mood:   Depressed   Affect:   Euthymic   Thought process and Associations:   Logical, linear, clear, coherent, goal directed   Abnormal/psychotic thought content:     Denies SI, HI, self harm, delusions, obsessions, paranoid ideation, or ideas of reference   Perceptual disturbances:     Denies auditory and visual hallucinations, behavior not concerning for response to internal stimuli     Other:            Visit was completed face to face.          [name], PMHNP

## 2020-03-29 NOTE — Unmapped (Signed)
[name],  I ordered a BSC and got message today that they could not reach Cindy [name]. I sent her a mychart message with the number to call. Can you follow up with her and/or her daughter later this week to see if this has been resolve?  tahnks  Cindy [name]

## 2020-03-30 DIAGNOSIS — M4120 Other idiopathic scoliosis, site unspecified: Principal | ICD-10-CM

## 2020-03-30 DIAGNOSIS — R5381 Other malaise: Principal | ICD-10-CM

## 2020-03-30 DIAGNOSIS — M1711 Unilateral primary osteoarthritis, right knee: Principal | ICD-10-CM

## 2020-04-03 DIAGNOSIS — R5381 Other malaise: Principal | ICD-10-CM

## 2020-04-03 DIAGNOSIS — M1711 Unilateral primary osteoarthritis, right knee: Principal | ICD-10-CM

## 2020-04-03 DIAGNOSIS — M4120 Other idiopathic scoliosis, site unspecified: Principal | ICD-10-CM

## 2020-04-04 DIAGNOSIS — R5381 Other malaise: Principal | ICD-10-CM

## 2020-04-04 DIAGNOSIS — M4120 Other idiopathic scoliosis, site unspecified: Principal | ICD-10-CM

## 2020-04-04 DIAGNOSIS — M1711 Unilateral primary osteoarthritis, right knee: Principal | ICD-10-CM

## 2020-04-06 DIAGNOSIS — R5381 Other malaise: Principal | ICD-10-CM

## 2020-04-06 DIAGNOSIS — M1711 Unilateral primary osteoarthritis, right knee: Principal | ICD-10-CM

## 2020-04-06 DIAGNOSIS — M4120 Other idiopathic scoliosis, site unspecified: Principal | ICD-10-CM

## 2020-04-07 DIAGNOSIS — R5381 Other malaise: Principal | ICD-10-CM

## 2020-04-07 DIAGNOSIS — M1711 Unilateral primary osteoarthritis, right knee: Principal | ICD-10-CM

## 2020-04-07 DIAGNOSIS — M4120 Other idiopathic scoliosis, site unspecified: Principal | ICD-10-CM

## 2020-04-10 DIAGNOSIS — R5381 Other malaise: Principal | ICD-10-CM

## 2020-04-10 DIAGNOSIS — M1711 Unilateral primary osteoarthritis, right knee: Principal | ICD-10-CM

## 2020-04-10 DIAGNOSIS — M4120 Other idiopathic scoliosis, site unspecified: Principal | ICD-10-CM

## 2020-04-12 ENCOUNTER — Encounter: Admit: 2020-04-12 | Discharge: 2020-05-05 | Payer: MEDICARE

## 2020-04-13 NOTE — Unmapped (Signed)
It's like the needle gets stuck in the groove. Report that she has been waking up in the middle of the night for several hours and having intrusive thoughts, reliving past traumas past 2-3 nights. Historically fall has been more difficult time of year of Ms. Felkins. July is full of anniversary dates such as her sister's, father's and mother's death's. Daughter is at the beach for 11 days. Discontinued bupropion after two tablets due to increased anxiety.     Take benadryl at night. Will check in tomorrow morning.

## 2020-04-14 MED ORDER — ATROVENT HFA 17 MCG/ACTUATION AEROSOL INHALER
2 refills | 0 days | Status: CP
Start: 2020-04-14 — End: ?

## 2020-04-14 NOTE — Unmapped (Signed)
Pharmacy contacted the clinic on behalf of the patient. Pharmacy verified. Cindy [name]

## 2020-04-17 DIAGNOSIS — M1711 Unilateral primary osteoarthritis, right knee: Principal | ICD-10-CM

## 2020-04-17 DIAGNOSIS — F259 Schizoaffective disorder, unspecified: Principal | ICD-10-CM

## 2020-04-17 DIAGNOSIS — E0520 Thyrotoxicosis with toxic multinodular goiter without thyrotoxic crisis or storm: Principal | ICD-10-CM

## 2020-04-17 DIAGNOSIS — I10 Essential (primary) hypertension: Principal | ICD-10-CM

## 2020-04-17 DIAGNOSIS — K582 Mixed irritable bowel syndrome: Principal | ICD-10-CM

## 2020-04-17 DIAGNOSIS — M810 Age-related osteoporosis without current pathological fracture: Principal | ICD-10-CM

## 2020-04-17 DIAGNOSIS — M3500 Sicca syndrome, unspecified: Principal | ICD-10-CM

## 2020-04-17 DIAGNOSIS — M4120 Other idiopathic scoliosis, site unspecified: Principal | ICD-10-CM

## 2020-04-17 DIAGNOSIS — G3184 Mild cognitive impairment, so stated: Principal | ICD-10-CM

## 2020-04-17 DIAGNOSIS — G8929 Other chronic pain: Principal | ICD-10-CM

## 2020-04-18 DIAGNOSIS — I10 Essential (primary) hypertension: Principal | ICD-10-CM

## 2020-04-18 DIAGNOSIS — G3184 Mild cognitive impairment, so stated: Principal | ICD-10-CM

## 2020-04-18 DIAGNOSIS — M3500 Sicca syndrome, unspecified: Principal | ICD-10-CM

## 2020-04-18 DIAGNOSIS — G8929 Other chronic pain: Principal | ICD-10-CM

## 2020-04-18 DIAGNOSIS — K582 Mixed irritable bowel syndrome: Principal | ICD-10-CM

## 2020-04-18 DIAGNOSIS — M810 Age-related osteoporosis without current pathological fracture: Principal | ICD-10-CM

## 2020-04-18 DIAGNOSIS — M4120 Other idiopathic scoliosis, site unspecified: Principal | ICD-10-CM

## 2020-04-18 DIAGNOSIS — M1711 Unilateral primary osteoarthritis, right knee: Principal | ICD-10-CM

## 2020-04-18 DIAGNOSIS — E0520 Thyrotoxicosis with toxic multinodular goiter without thyrotoxic crisis or storm: Principal | ICD-10-CM

## 2020-04-18 DIAGNOSIS — F259 Schizoaffective disorder, unspecified: Principal | ICD-10-CM

## 2020-04-19 ENCOUNTER — Telehealth
Admit: 2020-04-19 | Discharge: 2020-04-20 | Payer: MEDICARE | Attending: Psychiatric/Mental Health | Primary: Psychiatric/Mental Health

## 2020-04-19 DIAGNOSIS — E0520 Thyrotoxicosis with toxic multinodular goiter without thyrotoxic crisis or storm: Principal | ICD-10-CM

## 2020-04-19 DIAGNOSIS — I10 Essential (primary) hypertension: Principal | ICD-10-CM

## 2020-04-19 DIAGNOSIS — G3184 Mild cognitive impairment, so stated: Principal | ICD-10-CM

## 2020-04-19 DIAGNOSIS — F259 Schizoaffective disorder, unspecified: Principal | ICD-10-CM

## 2020-04-19 DIAGNOSIS — M3500 Sicca syndrome, unspecified: Principal | ICD-10-CM

## 2020-04-19 DIAGNOSIS — K582 Mixed irritable bowel syndrome: Principal | ICD-10-CM

## 2020-04-19 DIAGNOSIS — M810 Age-related osteoporosis without current pathological fracture: Principal | ICD-10-CM

## 2020-04-19 DIAGNOSIS — G8929 Other chronic pain: Principal | ICD-10-CM

## 2020-04-19 DIAGNOSIS — M4120 Other idiopathic scoliosis, site unspecified: Principal | ICD-10-CM

## 2020-04-19 DIAGNOSIS — M1711 Unilateral primary osteoarthritis, right knee: Principal | ICD-10-CM

## 2020-04-19 MED ORDER — INVEGA SUSTENNA 156 MG/ML INTRAMUSCULAR SYRINGE
INTRAMUSCULAR | 10 refills | 28 days | Status: CP
Start: 2020-04-19 — End: 2021-02-22
  Filled 2020-04-27: qty 1, 28d supply, fill #0

## 2020-04-19 MED ORDER — PALIPERIDONE ER 3 MG TABLET,EXTENDED RELEASE 24 HR
ORAL_TABLET | Freq: Every morning | ORAL | 0 refills | 30 days | Status: CP
Start: 2020-04-19 — End: 2020-05-19

## 2020-04-19 NOTE — Unmapped (Signed)
Follow-up instructions:  -- Please continue taking your medications as prescribed for your mental health.   -- Do not make changes to your medications, including taking more or less than prescribed, unless under the supervision of your physician. Be aware that some medications may make you feel worse if abruptly stopped  -- Please refrain from using illicit substances, as these can affect your mood and could cause anxiety or other concerning symptoms.   -- Seek further medical care for any increase in symptoms or new symptoms such as thoughts of wanting to hurt yourself or hurt others.     Contact info:  Life-threatening emergencies: call 911 or go to the nearest ER for medical or psychiatric attention.     Issues that need urgent attention but are not life threatening: call the clinic at [phone].    Non-urgent routine concerns, questions, and refill requests: please call the clinic for assistance.     Regarding appointments:  - If you need to cancel your appointment, we ask that you call [phone] at least 24 hours before your scheduled appointment.  - If for any reason you arrive 15 minutes later than your scheduled appointment time, you may not be seen and your visit may be rescheduled.  - Please remember that we will not automatically reschedule missed appointments.  - If you miss two (2) appointments without letting us know at least 24 hours in advance, you will likely be referred to a provider in your community.  - We will do our best to be on time. Sometimes an emergency will arise that might cause your clinician to be late. We will try to inform you of this when you check in for your appointment. If you wait more than 15 minutes past your appointment time without such notice, please speak with the front desk staff.    In the event of bad weather, the clinic staff will attempt to contact you, should your appointment need to be rescheduled. Additionally, you can call the Patient Weather Line [phone] for system-wide clinic status    For more information and reminders regarding clinic policies (these were provided when you were admitted to the clinic), please ask the front desk.    Maija Biggers, MPH, PMHNP-BC  Department of Psychiatry  [facility]  [address]  [address]  [place], [place] [address]  Phone: [phone]

## 2020-04-19 NOTE — Unmapped (Signed)
??      Create Note1 wrap up2 New STEP3 Follow up4 FOLLOWUPJSS    My NoteIncomplete  04/19/2020 TagDetails     AcceptCancel  My Note  04/19/2020              [] Hide copied text    [] Hover for details  [facility]  Psychiatry   Established Patient??E&M Service - Outpatient   ??  ??  Assessment:  ??  Cindy [name] presents for follow-up evaluation.   ??  Identifying Information:  Cindy [name] is a 79 y.o. female with a history of PPD,??schizoaffective disorder and hyperthyroidism who was admitted involuntarily to the [facility] unit??6/18- 03/01/2020, who presents for evaluation of??mood, medication and to establish care. Marland Kitchen??Endorsing depression related to??recent??decline??of physical abilities. This is Cindy [name] first outing in a wheel chair.??Today, Cindy [name] is endorsing anhedonia, low motivation and mood, and sialorrhea that started after receiving [place] injection.  ??  Risk Assessment:  A full psychiatric risk assessment was conducted on 03/16/2020 and risks do not appear significantly changed from that visit.              While future psychiatric events cannot be accurately predicted, the patient does not currently require acute inpatient psychiatric care and does not currently meet [facility] involuntary commitment criteria.          ??  Plan:  ??  Problem: Schizoaffective disorder  Status of problem: chronic and stable  Interventions: Increase Invega Sustenna from 117 mg to 156 mg q 28 days. Last injection 03/28/20  Discussed possible risk of side effects of Invega including hyperglycemia, NMS, seizures, EPS, hyperprolactinemia, diabetes, dyslipidemia, tardive dyskinesia, sedation, hyper salivation, orthostatic hypotension, tachycardia, injection site reactions.??  - considered adding antidepressant to help support depressed mood if starting therapy (theripist at [facility] starting next week) and increasing LAI is ineffective for mood support.   ??  Problem: Adjustment disorder  Status of problem:  new problem to this provider  Interventions: Recently made appt with new therapist. Pt recently experienced significant change in mobility and independence. Also connected to PT that is helping with independence.   ??  ??  Problem: Sialorrhea  Status of problem: improved or improving  Interventions: continue ipratropium bromide SL 1-2 sprays up to 4 x per day  Discussed possible side effects of ipratropium bromide including hypersensitivity reactions, bronchospasm, bladder neck obstruction, intraocular pressure, prostatic hyperplasia, urinary retention, abnormal taste in mouth, MI, dry nasal mucosa, sinusitis, CVA  ??  Problem: Depression  Status of problem:  not improving as expected  Interventions: - discontinue bupropion xl 150 mg due to side effects  ??  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  ??  Problem: Insomina  Status of problem:  chronic and stable  Interventions: - continue melatonin 3 mg OTC  -may also take OTC benadryl prn. Aware of risk of anticholinergic side effects.   ??  Problem: Hx of medication side effects  Status of problem:  new problem to this provider  Interventions:Genesight test 03/16/2020   ??  Risks/benefits and indications for treatment with medications above were discussed with the patient. The patient asked appropriate questions, acknowledged understanding of answers, and provided informed consent to initiation&??continuation of medications above.??  ??  Return to clinic appointment:??in 3-4 weeks or earlier as needed by patient??  ??  ??  ??  Psychotherapy provided:  No billable psychotherapy service provided but brief supportive therapy was utilized.  ??  Patient has been given this writer's contact information as well as the [facility] Psychiatry urgent line number. The patient has been instructed to call 911 for emergencies.  ??  ??  Subjective:  ??  Interval History:   I'm high strung and so was my Grandmother. Reports sialorrhea and sleep have improved. Started taking diphenhydramine 50 mg for sleep with good results. Taking acetaminophen q 8 hours for pain which is disrupting sleep. Buproprion resulted in increased anxiety and restlessness and discontinued after 2 doses. Feels like uneasiness and mood have been worse since decreasing Invega sustenna. Would like to start oral paliperidone bridge until next scheduled injection next week. Recently established a therapist at [facility] in [place]. Has worked with this Child psychotherapist before. [name] reports that [name] recent loss of physial ability and fear of falling is increasing her anxiety. Physically. Cindy [name] looks very guarded. I feel like I am in kindergarten again. Everything is so different.   Past medication history:??Fluoxetine (not effective), tegretol (anaphylaxis), lithium (dystonia), paliperidone (hypersalivation)??  ??  ??  Objective:  ??  Mental Status Exam:  Appearance:  ??  Appears stated age and Clean/Neat   Motor: ??  No abnormal movements   Speech/Language:  ??  Normal rate, volume, tone, fluency   Mood: ??  Depressed   Affect: ??  Euthymic   Thought process and Associations: ??  Logical, linear, clear, coherent, goal directed   Abnormal/psychotic thought content:   ??  Denies SI, HI, self harm, delusions, obsessions, paranoid ideation, or ideas of reference   Perceptual disturbances:   ??  Denies auditory and visual hallucinations, behavior not concerning for response to internal stimuli  ??   Other: ??     ??  ??  ??  Visit was completed by video (or phone) and the appropriate disclaimer has been included below.  ??    ??  I spent 30 minutes on the real-time audio and video with the patient on the date of service. I spent an additional 5 minutes on pre- and post-visit activities on the date of service.   ??  The patient was physically located in [place] or a state in which I am permitted to provide care. The patient and/or parent/guardian understood that s/he may incur co-pays and cost sharing, and agreed to the telemedicine visit. The visit was reasonable and appropriate under the circumstances given the patient's presentation at the time.  ??  The patient and/or parent/guardian has been advised of the potential risks and limitations of this mode of treatment (including, but not limited to, the absence of in-person examination) and has agreed to be treated using telemedicine. The patient's/patient's family's questions regarding telemedicine have been answered.   ??  If the visit was completed in an ambulatory setting, the patient and/or parent/guardian has also been advised to contact their provider???s office for worsening conditions, and seek emergency medical treatment and/or call 911 if the patient deems either necessary.  ??  ??  [name], PMHNP

## 2020-04-19 NOTE — Unmapped (Signed)
[facility]  Psychiatry   Established Patient E&M Service - Outpatient       Assessment:    Cindy [name] presents for follow-up evaluation.     Identifying Information:  Cindy [name] is a 79 y.o. female with a history of PPD, schizoaffective disorder and hyperthyroidism who was admitted involuntarily to the [facility] unit 6/18- 03/01/2020, who presents for evaluation of mood, medication and to establish care. . Endorsing depression related to recent decline of physical abilities. This is Cindy [name] first outing in a wheel chair. Today, Cindy [name] is endorsing anhedonia, low motivation and mood, and sialorrhea that started after receiving [place] injection.    Risk Assessment:  A full psychiatric risk assessment was conducted on 03/16/2020 and risks do not appear significantly changed from that visit.   While future psychiatric events cannot be accurately predicted, the patient does not currently require acute inpatient psychiatric care and does not currently meet [facility] involuntary commitment criteria.      Plan:    Problem: Schizoaffective disorder  Status of problem: chronic and stable  Interventions: Increase Invega Sustenna from 117 mg to 156 mg q 28 days. Last injection 03/28/20  Discussed possible risk of [name] effects of Invega including hyperglycemia, NMS, seizures, EPS, hyperprolactinemia, diabetes, dyslipidemia, tardive dyskinesia, sedation, hyper salivation, orthostatic hypotension, tachycardia, injection site reactions.   - considered adding antidepressant to help support depressed mood if starting therapy (theripist at [facility] starting next week) and increasing LAI is ineffective for mood support.     Problem: Adjustment disorder  Status of problem:  new problem to this provider  Interventions: Recently made appt with new therapist. Pt recently experienced significant change in mobility and independence. Also connected to PT that is helping with independence.       Problem: Sialorrhea  Status of problem: improved or improving  Interventions: continue ipratropium bromide SL 1-2 sprays up to 4 x per day  Discussed possible [name] effects of ipratropium bromide including hypersensitivity reactions, bronchospasm, bladder neck obstruction, intraocular pressure, prostatic hyperplasia, urinary retention, abnormal taste in mouth, MI, dry nasal mucosa, sinusitis, CVA    Problem: Depression  Status of problem:  not improving as expected  Interventions: - discontinue bupropion xl 150 mg due to [name] effects    Problem: r/o Mild neurocognitive impairment  Status of problem:  new problem to this provider  Interventions: MOCA performed 02/11/20 score:??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 Will repeat at future follow up to reevaluate.     Problem: Insomina  Status of problem:  chronic and stable  Interventions: - continue melatonin 3 mg OTC  -may also take OTC benadryl prn. Aware of risk of anticholinergic [name] effects.     Problem: Hx of medication [name] effects  Status of problem:  new problem to this provider  Interventions:Genesight test 03/16/2020     Risks/benefits and indications for treatment with medications above were discussed with the patient. The patient asked appropriate questions, acknowledged understanding of answers, and provided informed consent to initiation&??continuation of medications above.??  ??  Return to clinic appointment: in 3-4 weeks or earlier as needed by patient         Psychotherapy provided:  No billable psychotherapy service provided but brief supportive therapy was utilized.    Patient has been given this writer's contact information as well as the [facility] Psychiatry urgent line number. The patient has been instructed to call 911 for emergencies.      Subjective:  Interval History:   I'm high strung and so was my Grandmother. Reports sialorrhea and sleep have improved. Started taking diphenhydramine 50 mg for sleep with good results. Taking acetaminophen q 8 hours for pain which is disrupting sleep. Buproprion resulted in increased anxiety and restlessness and discontinued after 2 doses. Feels like uneasiness and mood have been worse since decreasing Invega sustenna. Would like to start oral paliperidone bridge until next scheduled injection next week. Recently established a therapist at [facility] in [place]. Has worked with this Child psychotherapist before. Cindy [name] reports that Cindy [name] recent loss of physial ability and fear of falling is increasing her anxiety. Physically. Cindy [name] looks very guarded. I feel like I am in kindergarten again. Everything is so different.   Past medication history: Fluoxetine (not effective), tegretol (anaphylaxis), lithium (dystonia), paliperidone (hypersalivation)       Objective:    Mental Status Exam:  Appearance:    Appears stated age and Clean/Neat   Motor:   No abnormal movements   Speech/Language:    Normal rate, volume, tone, fluency   Mood:   Depressed   Affect:   Euthymic   Thought process and Associations:   Logical, linear, clear, coherent, goal directed   Abnormal/psychotic thought content:     Denies SI, HI, self harm, delusions, obsessions, paranoid ideation, or ideas of reference   Perceptual disturbances:     Denies auditory and visual hallucinations, behavior not concerning for response to internal stimuli     Other:            Visit was completed by video (or phone) and the appropriate disclaimer has been included below.        I spent 30 minutes on the real-time audio and video with the patient on the date of service. I spent an additional 5 minutes on pre- and post-visit activities on the date of service.     The patient was physically located in [place] or a state in which I am permitted to provide care. The patient and/or parent/guardian understood that s/he may incur co-pays and cost sharing, and agreed to the telemedicine visit. The visit was reasonable and appropriate under the circumstances given the patient's presentation at the time.    The patient and/or parent/guardian has been advised of the potential risks and limitations of this mode of treatment (including, but not limited to, the absence of in-person examination) and has agreed to be treated using telemedicine. The patient's/patient's family's questions regarding telemedicine have been answered.     If the visit was completed in an ambulatory setting, the patient and/or parent/guardian has also been advised to contact their provider???s office for worsening conditions, and seek emergency medical treatment and/or call 911 if the patient deems either necessary.      [name], PMHNP

## 2020-04-20 ENCOUNTER — Ambulatory Visit: Admit: 2020-04-20 | Discharge: 2020-04-21 | Payer: MEDICARE

## 2020-04-20 LAB — THYROID STIMULATING HORMONE: Thyrotropin:ACnc:Pt:Ser/Plas:Qn:: 0.046 — ABNORMAL LOW

## 2020-04-20 LAB — FREE T4: Thyroxine.free:MCnc:Pt:Ser/Plas:Qn:: 1.39

## 2020-04-20 LAB — T3 FREE: Triiodothyronine.free:MCnc:Pt:Ser/Plas:Qn:: 2.54

## 2020-04-20 MED ORDER — BUPROPION HCL XL 150 MG 24 HR TABLET, EXTENDED RELEASE
ORAL_TABLET | 1 refills | 0 days
Start: 2020-04-20 — End: ?

## 2020-04-20 NOTE — Unmapped (Signed)
ASSESSMENT/PLAN:      Problem List Items Addressed This Visit        Digestive    Irritable bowel syndrome with both constipation and diarrhea     We will keep follow up with her GI on our to do list, but will not be at top of the list right now.             Endocrine    Subclinical hyperthyroidism - Primary     Not yet scheduled to see [facility] endocrinology for her hyperthyroidism and her osteoporosis. I think that in addition to her eyes and her dental needs that this is next on the priority list.   -in regards to her thyroid will get repeat testing while they are here  -in regards to her osteoporosis, in my previous discussion with her dentist, her treatment should be delayed for any dental surgery needs and then can proceed. 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 She and daughter report multiple different uncontrolled eye problems. We discussed that first step will be re-evaluation by her doctor at the eye clinic and then they can help set up plan for which issues are most/least urgent. Then they can work on a timeline for these different needs for her eyes.          Schizoaffective disorder (CMS-HCC)     Continues to follow closely with psychiatry. She is struggling with the stigma around her diagnosis and feeling like a burden on her daughter. Psychiatry is actively modifiying her regimen to get her symptoms under control and improve function. She continues to work with [employer] and we are working to prioritize and plan visits for her other ongoing medical problems. She would like her daughter to have full proxy of her mychart (clinical and financial) so this was set up today. Will ask [facility] to reach out to daughter about exploring medicaid, further resources to support her mom in the home. Patient does not want to do PACE right now but we discussed this could be an option, especially if she also has medicaid.                 HEALTH MAINTENANCE ITEMS STILL DUE:  There are no preventive care reminders to display for this patient.    Follow-up: No follow-ups on file.    Future Appointments   Date Time Provider Department Center   05/01/2020 To Be Determined Alvy Bimler, PT [facility]   05/02/2020 To Be Determined Scherry Ran, OT [facility]   05/03/2020 To Be Determined Alvy Bimler, PT [facility]   05/04/2020 To Be Determined Scherry Ran, OT [facility]   05/18/2020 10:30 AM Delorse Lek, MD [facility]   05/19/2020  3:45 PM Hussam Rayna Sexton, MD [facility]   05/26/2020 11:00 AM [name], PMHNP [facility]  [facility]   06/12/2020  1:30 PM [facility]   08/04/2020  9:50 AM Jimmie Molly, MD [facility]       Chief Complaint   Patient presents with   ??? Follow-up       SUBJECTIVE:    Ms. Degenhart is a 79 y.o. female that presents to clinic today regarding the following issues:  -she wrote me a mychart message last night with her questions for today:   I and Colleen need yourhelp coorinating and ordering followup visis on my eyes (catsract readiness, film remocl from prior cataract surgery, Drooping eye lids, abraided corneas) thyroid, teeh (lost my partial when hospitalized, might need osteoporotic rx before surgeries) endrocrine and GI stuff.  My heart rate is often high but not BP. thanksI have addded 50 mg benedryl at nite and tonote start srink pill. thanks  - have questions for SW  - changing meds with psychiatry    Review of Systems as above      Ms. Reznick reports that she has never smoked. She has never used smokeless tobacco.    OBJECTIVE:    Ms. Gorley  height is 156.2 cm (5' 1.5) and weight is 55.7 kg (122 lb 12.8 oz). Her temporal temperature is 37.1 ??C (98.7 ??F). Her blood pressure is 156/82 and her pulse is 95.   Wt Readings from Last 3 Encounters:   04/20/20 55.7 kg (122 lb 12.8 oz)   03/16/20 60.3 kg (133 lb)   03/10/20 61.3 kg (135 lb 3.2 oz)      PHQ-9 PHQ-9 TOTAL SCORE   03/29/2020 9   03/16/2020 11   02/26/2020 8   02/20/2020 5   02/13/2020 13   09/10/2017 2     PHQ-2 Score:      PHQ-9 Score:      Inocente Salles Score:      GAD-7 Score:     Physical Exam  Constitutional:       General: She is not in acute distress.     Appearance: She is not ill-appearing.   Neurological:      Mental Status: She is alert.

## 2020-04-20 NOTE — Unmapped (Signed)
Cindy [name],  Can you talk with Cindy [name] daughter about resources. She needs help with medical power of attorney, trying to get medicaid for Cindy [name], trying to get more resources in the house (aides etc). Her daughter had to quit her job to care for Cindy [name] full time. Can you reach out to the daughter to start working on this?   Also interested in more information about meals on wheels.  Thanks  Cindy [name]

## 2020-04-20 NOTE — Unmapped (Addendum)
Dentist FIRST, finish any surgeries THEN reclast  Eye doctor visit scheduled. THEN prioritize the different issues with the eye doctor  Thyroid. Next step = get appointment. Important for physical and mental health to evaluate thyroid  Still need GI visit, but this can move down the list a little bit.

## 2020-04-20 NOTE — Unmapped (Signed)
[facility] Administered Medication Refill Coordination Note      NAME:Cindy [name] DOB: [birth year]      Medication: [name]  Day Supply: 28 days      SHIPPING      Next delivery from [facility] [phone]) to [facility] [place] for Cindy [name] is scheduled for 09/09.    Clinic contact: Sharma Covert    Patient's next nurse visit for administration: n/a.    We will follow up with clinic monthly for standard refill processing and delivery.      Lindsay Straka Samella Parr  Specialty Pharmacy Technician

## 2020-04-21 NOTE — Unmapped (Signed)
Telephone call to Ms. Cindy [name] to follow up on changes in medication made 2 days ago. Ms. Cindy [name] feels that since adding 3 mg or oral paliperidone, anxiety has improved during the day and feeling less overwhelmed by daily task. Taking 50 mg of benadryl for sleep with good benefit. Aware of possible anti cholenergic side effects. Anxiety is increased by recent decline in physical strength, ability and mobility. Very concerned about falls. Would like to return to previous higher dose of [place] (117mg  to 156 mg).  Daughter, [name] will make injection appointment for next week.

## 2020-04-21 NOTE — Unmapped (Signed)
Care Management Progress Note  [facility]                 Date of Service:  04/21/2020      Service: Care Management - phone    Purpose of contact:        SW Intern contacted patient's daughter regarding SDOH, in-home service needs, financial resources, meals on wheels, and advanced directives medical power of attorney form information.     Patient daughter mentioned that some in-home help would be helpful, such as an in-home aid coming twice per week.  SW intern provided Me[employer] Wheels contact phone at [phone] in [place]. SW intern also provided information regarding applying for Medicaid at [email or web address]://hunt-bailey.[email or web address] and applying for Low Income Energy Assistance program via the same [email or web address] website. SW intern also provided information to contact [facility] office at [phone].    SW intern also sent patient's daughter medical power of attorney form to daughter's e-mail at  [email or web address] .       Additional Information/Plan:  Will continue to support should additional needs arise.    Curlene Labrum  Social Work Intern  [facility]

## 2020-04-27 DIAGNOSIS — M810 Age-related osteoporosis without current pathological fracture: Principal | ICD-10-CM

## 2020-04-27 DIAGNOSIS — M1711 Unilateral primary osteoarthritis, right knee: Principal | ICD-10-CM

## 2020-04-27 DIAGNOSIS — K582 Mixed irritable bowel syndrome: Principal | ICD-10-CM

## 2020-04-27 DIAGNOSIS — M3500 Sicca syndrome, unspecified: Principal | ICD-10-CM

## 2020-04-27 DIAGNOSIS — F259 Schizoaffective disorder, unspecified: Principal | ICD-10-CM

## 2020-04-27 DIAGNOSIS — G3184 Mild cognitive impairment, so stated: Principal | ICD-10-CM

## 2020-04-27 DIAGNOSIS — M4120 Other idiopathic scoliosis, site unspecified: Principal | ICD-10-CM

## 2020-04-27 DIAGNOSIS — I10 Essential (primary) hypertension: Principal | ICD-10-CM

## 2020-04-27 DIAGNOSIS — E0520 Thyrotoxicosis with toxic multinodular goiter without thyrotoxic crisis or storm: Principal | ICD-10-CM

## 2020-04-27 DIAGNOSIS — G8929 Other chronic pain: Principal | ICD-10-CM

## 2020-04-27 MED FILL — INVEGA SUSTENNA 156 MG/ML INTRAMUSCULAR SYRINGE: 28 days supply | Qty: 1 | Fill #0 | Status: AC

## 2020-04-28 ENCOUNTER — Institutional Professional Consult (permissible substitution): Admit: 2020-04-28 | Discharge: 2020-04-29 | Payer: MEDICARE | Attending: Psychiatry | Primary: Psychiatry

## 2020-04-28 ENCOUNTER — Ambulatory Visit: Admit: 2020-04-28 | Discharge: 2020-04-29

## 2020-04-28 ENCOUNTER — Ambulatory Visit: Admit: 2020-04-28 | Discharge: 2020-04-29 | Payer: MEDICARE

## 2020-04-28 DIAGNOSIS — H02834 Dermatochalasis of left upper eyelid: Principal | ICD-10-CM

## 2020-04-28 DIAGNOSIS — H57813 Brow ptosis, bilateral: Principal | ICD-10-CM

## 2020-04-28 DIAGNOSIS — H02831 Dermatochalasis of right upper eyelid: Principal | ICD-10-CM

## 2020-04-28 DIAGNOSIS — H18529 ABMD (anterior basement membrane dystrophy): Principal | ICD-10-CM

## 2020-04-28 DIAGNOSIS — F259 Schizoaffective disorder, unspecified: Principal | ICD-10-CM

## 2020-04-28 DIAGNOSIS — H18599 Map-dot-fingerprint corneal dystrophy: Principal | ICD-10-CM

## 2020-04-28 DIAGNOSIS — H02403 Unspecified ptosis of bilateral eyelids: Principal | ICD-10-CM

## 2020-04-28 DIAGNOSIS — H2512 Age-related nuclear cataract, left eye: Principal | ICD-10-CM

## 2020-04-28 NOTE — Unmapped (Addendum)
Continues to follow closely with psychiatry. She is struggling with the stigma around her diagnosis and feeling like a burden on her daughter. Psychiatry is actively modifiying her regimen to get her symptoms under control and improve function. She continues to work with [facility] and we are working to prioritize and plan visits for her other ongoing medical problems. She would like her daughter to have full proxy of her mychart (clinical and financial) so this was set up today. Will ask [facility] to reach out to daughter about exploring medicaid, further resources to support her mom in the home. Patient does not want to do PACE right now but we discussed this could be an option, especially if she also has medicaid.

## 2020-04-28 NOTE — Unmapped (Signed)
Assessment & Plan:  Cindy [name] is a 79 y.o. female with the following diagnoses:   1. Map-dot-fingerprint corneal dystrophy    2. 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 ABMD (anterior basement membrane dystrophy)    4. Dermatochalasis of both upper eyelids    5. Ptosis, both eyelids    6. Brow ptosis, bilateral      Photos: 04/28/20  Bilateral brow ptosis and dermatochalasis              #Dermatochalasis both eyes  #Ptosis both eyes   #Brow ptosis  #Lower Lid Involutional Ectropion OU  - Patient would like to have cataract OS addressed first prior to lid evaluation  - surgery would be reasonable but can address next visit if cornea and cataract do not require more attention    #Cataract OS  #ABMD  - Follow up with Dr. Georgeann Oppenheim as scheduled, will have cataract evaluated at that time    Plan:   - cataract eval then back to plastics for lid evaluation and field    Claris Pong, MD/MPH  Oculofacial Plastic & Reconstructive Surgery

## 2020-04-28 NOTE — Unmapped (Addendum)
Not yet scheduled to see [facility] endocrinology for her hyperthyroidism and her osteoporosis. I think that in addition to her eyes and her dental needs that this is next on the priority list.   -in regards to her thyroid will get repeat testing while they are here  -in regards to her osteoporosis, in my previous discussion with her dentist, her treatment should be delayed for any dental surgery needs and then can proceed. She and daughter think she will need more dental work so would delay starting the treatment for her osteoporosis until after this is complete per her dentist at [facility]  -they will reach out to set up the endocrine appt

## 2020-04-28 NOTE — Unmapped (Signed)
Administered long-acting injectable today, 04/28/2020. See MAR for additional documentation. Arlisa Leclere Orson Ape freitas, CMA

## 2020-04-28 NOTE — Unmapped (Signed)
We will keep follow up with her GI on our to do list, but will not be at top of the list right now.

## 2020-04-28 NOTE — Unmapped (Signed)
She is followed by [facility] eye clinic. She and daughter report multiple different uncontrolled eye problems. We discussed that first step will be re-evaluation by her doctor at the eye clinic and then they can help set up plan for which issues are most/least urgent. Then they can work on a timeline for these different needs for her eyes.

## 2020-05-01 DIAGNOSIS — M4120 Other idiopathic scoliosis, site unspecified: Principal | ICD-10-CM

## 2020-05-01 DIAGNOSIS — M1711 Unilateral primary osteoarthritis, right knee: Principal | ICD-10-CM

## 2020-05-01 DIAGNOSIS — G3184 Mild cognitive impairment, so stated: Principal | ICD-10-CM

## 2020-05-01 DIAGNOSIS — I10 Essential (primary) hypertension: Principal | ICD-10-CM

## 2020-05-01 DIAGNOSIS — M3500 Sicca syndrome, unspecified: Principal | ICD-10-CM

## 2020-05-01 DIAGNOSIS — K582 Mixed irritable bowel syndrome: Principal | ICD-10-CM

## 2020-05-01 DIAGNOSIS — E0520 Thyrotoxicosis with toxic multinodular goiter without thyrotoxic crisis or storm: Principal | ICD-10-CM

## 2020-05-01 DIAGNOSIS — G8929 Other chronic pain: Principal | ICD-10-CM

## 2020-05-01 DIAGNOSIS — M810 Age-related osteoporosis without current pathological fracture: Principal | ICD-10-CM

## 2020-05-01 DIAGNOSIS — F259 Schizoaffective disorder, unspecified: Principal | ICD-10-CM

## 2020-05-01 NOTE — Unmapped (Signed)
I was the supervising physician in the delivery of the service. Malavika Lira M Dalyce Renne, MD

## 2020-05-02 DIAGNOSIS — K582 Mixed irritable bowel syndrome: Principal | ICD-10-CM

## 2020-05-02 DIAGNOSIS — G8929 Other chronic pain: Principal | ICD-10-CM

## 2020-05-02 DIAGNOSIS — M1711 Unilateral primary osteoarthritis, right knee: Principal | ICD-10-CM

## 2020-05-02 DIAGNOSIS — M3500 Sicca syndrome, unspecified: Principal | ICD-10-CM

## 2020-05-02 DIAGNOSIS — G3184 Mild cognitive impairment, so stated: Principal | ICD-10-CM

## 2020-05-02 DIAGNOSIS — F259 Schizoaffective disorder, unspecified: Principal | ICD-10-CM

## 2020-05-02 DIAGNOSIS — I10 Essential (primary) hypertension: Principal | ICD-10-CM

## 2020-05-02 DIAGNOSIS — E0520 Thyrotoxicosis with toxic multinodular goiter without thyrotoxic crisis or storm: Principal | ICD-10-CM

## 2020-05-02 DIAGNOSIS — M4120 Other idiopathic scoliosis, site unspecified: Principal | ICD-10-CM

## 2020-05-02 DIAGNOSIS — M810 Age-related osteoporosis without current pathological fracture: Principal | ICD-10-CM

## 2020-05-02 NOTE — Unmapped (Signed)
Reviewed message. Patient has been evaluated/seen.

## 2020-05-03 DIAGNOSIS — M3500 Sicca syndrome, unspecified: Principal | ICD-10-CM

## 2020-05-03 DIAGNOSIS — G8929 Other chronic pain: Principal | ICD-10-CM

## 2020-05-03 DIAGNOSIS — G3184 Mild cognitive impairment, so stated: Principal | ICD-10-CM

## 2020-05-03 DIAGNOSIS — K582 Mixed irritable bowel syndrome: Principal | ICD-10-CM

## 2020-05-03 DIAGNOSIS — M810 Age-related osteoporosis without current pathological fracture: Principal | ICD-10-CM

## 2020-05-03 DIAGNOSIS — M4120 Other idiopathic scoliosis, site unspecified: Principal | ICD-10-CM

## 2020-05-03 DIAGNOSIS — I10 Essential (primary) hypertension: Principal | ICD-10-CM

## 2020-05-03 DIAGNOSIS — E0520 Thyrotoxicosis with toxic multinodular goiter without thyrotoxic crisis or storm: Principal | ICD-10-CM

## 2020-05-03 DIAGNOSIS — M1711 Unilateral primary osteoarthritis, right knee: Principal | ICD-10-CM

## 2020-05-03 DIAGNOSIS — F259 Schizoaffective disorder, unspecified: Principal | ICD-10-CM

## 2020-05-03 NOTE — Unmapped (Signed)
[facility]   Care Management Progress Note             Date of Service:  05/03/2020      Service:  Care Management - phone    Purpose of contact:         Discussed the following with Neva Seat :        Barriers to Care:  Other psychosocial needs: Placement for patient.     Provider/Care Partner(s)  to follow up on:   N/A    Health Maintenance:  There are no preventive care reminders to display for this patient.     Patient daughter contacted CM to share that she was interested in long term care options for patient. CM discussed with patient care planning as it relates to long term care. CM shared with patient coverage and possibly coverage that [facility] would cover.     Patient daughter shared with CM that she would not be able to afford any out of pocket cost.      Additional Information/Plan:  CM provided patient daughter  with resources on who to contact and potential Long term care facilities in the area.   Patient daughter  provided next steps options as it relates to long term placement. Patient daughter will contact CM once she speaks to case worker for long term Medicaid.     Minutes spent providing outreach: 35    Elwood Bazinet S Broadus John  [employer]  [facility]

## 2020-05-05 DIAGNOSIS — R269 Unspecified abnormalities of gait and mobility: Principal | ICD-10-CM

## 2020-05-05 DIAGNOSIS — G3184 Mild cognitive impairment, so stated: Principal | ICD-10-CM

## 2020-05-05 DIAGNOSIS — E0520 Thyrotoxicosis with toxic multinodular goiter without thyrotoxic crisis or storm: Principal | ICD-10-CM

## 2020-05-05 DIAGNOSIS — G8929 Other chronic pain: Principal | ICD-10-CM

## 2020-05-05 DIAGNOSIS — M4120 Other idiopathic scoliosis, site unspecified: Principal | ICD-10-CM

## 2020-05-05 DIAGNOSIS — M810 Age-related osteoporosis without current pathological fracture: Principal | ICD-10-CM

## 2020-05-05 DIAGNOSIS — I10 Essential (primary) hypertension: Principal | ICD-10-CM

## 2020-05-05 DIAGNOSIS — M1711 Unilateral primary osteoarthritis, right knee: Principal | ICD-10-CM

## 2020-05-05 DIAGNOSIS — M3500 Sicca syndrome, unspecified: Principal | ICD-10-CM

## 2020-05-05 DIAGNOSIS — F259 Schizoaffective disorder, unspecified: Principal | ICD-10-CM

## 2020-05-05 DIAGNOSIS — K582 Mixed irritable bowel syndrome: Principal | ICD-10-CM

## 2020-05-06 NOTE — Unmapped (Signed)
Patient has completed HH OT, patient interested in aquatic therapy and outpatient OT as well. Will place referral. Does not look like OT is available at [facility]. Sent referral to OT at [address]

## 2020-05-11 NOTE — Unmapped (Signed)
Pharmacy contacted the clinic on behalf of the patient. Pharmacy verified. Cherylynn Liszewski

## 2020-05-16 ENCOUNTER — Ambulatory Visit
Admit: 2020-05-16 | Discharge: 2020-06-14 | Payer: MEDICARE | Attending: Rehabilitative and Restorative Service Providers" | Primary: Rehabilitative and Restorative Service Providers"

## 2020-05-16 MED ORDER — PALIPERIDONE ER 3 MG TABLET,EXTENDED RELEASE 24 HR
ORAL_TABLET | Freq: Every morning | ORAL | 0 refills | 30 days
Start: 2020-05-16 — End: 2020-06-15

## 2020-05-16 NOTE — Unmapped (Unsigned)
[facility] [place]  OUTPATIENT PHYSICAL THERAPY  05/16/2020  Note Type: Evaluation       Patient Name: Cindy [name]  Date of Birth:Jan 09, 1941  Visit #: 1 (1/10 Progress Note)  Diagnosis: No diagnosis found.  Referring MD:  Delorse Lek, MD   Initial Evaluation Date: 05/16/2020    Plan of Care Effective Date: ***    Assessment & Plan     Assessment details: Pt is a 79 y/o female who presents with ***  Pt demonstrates pain, decreased function, decreased ROM, decreased strength, and decreased flexibility.?? Functionally, she is limited with ***    Pt will benefit from skilled Physical Therapy to address deficits, to improve function, and to allow for return to prior level of function.     Patient and PT wore a mask for the entire therapy session.  PT also wore eye protection goggles.          Impairments: pain, impaired ADLs and decreased strength        Personal Factors/Comorbidities: 1    Examination of Body Systems: 1-2 elements  Body System: MSK   Clinical Presentation: stable  Clinical Decision Making: low    Prognosis: good            Therapy Goals  Goals: Short Term Goals:  In *** weeks:  1) Pt will be ind with HEP and able to demo x1 in order to make progressive gains in function between PT visits.    Long Term Goals:  In *** weeks:  1) Pt will increase FOTO score to >/= to the predicted level demonstrating a significant improvement in function since beginning PT.  2) Pt will have lumbar AROM & PROM that is WNL and comparable to the contralateral bilat in order to return to LE dominant ADLs with reduced pain and dsyfunction.  3) Pt will have no tenderness to palpation at the *** demonstrating lower irritability level of involved structures.    4) Pt will have ***/5 strength in the involved LE in all deficient planes of motion in order to return to LE dominant ADLs and recreation without pain.  5) Pt will be able to return to desired recreation, including ***, without pain or dysfunction.      Plan  Therapy options: will be seen for skilled physical therapy services    Planned therapy interventions: therapeutic activities, therapeutic exercises, manual therapy, neuromuscular re-education, education - patient, education - family/caregiver, body mechanics training, Aquatic Therapy, gait training, home exercise program, taping and postural training        Frequency: 1x week      Education provided to: patient.  Education provided: HEP, body mechanics, anatomy, treatment options and plan, role of therapy in Rehabilitation and importance of Therapy  Education Results: demonstrates understanding.  Communication/Consultation: Initial note sent to Referring Provider and Medicare Cert/POC sent to Referring Provider.      Treatment rendered today: Total Treatment Time: *** Minutes    Low Complexity Evaluation: *** minutes spent in evaluation prior to treatment.  Based on the synthesis of information below, including but not limited to patient's stable clinical presentation, with little to no personal factors/comorbidities impacting care, limited (1-2) body systems/functions that will be addressed during this episode, as well as the low complexity of the clinical decision making process regarding diagnosis and care- patient falls under the low complexity level for evaluation.  Diagnosis code: 81191        Therapeutic Exercise: *** Minutes  Pt  educated on pathology, anatomy, activity modification, pain management, prognosis, and plan of care.?? Pt agrees with POC.  Pt educated on HEP and provided with handout.?? Demos independently.  ***    Manual Therapy: *** Minutes  ***    Neuromuscular Re-education: *** Minutes  ***    Therapeutic Activity: *** Minutes  ***        Plan details: HEP:   Side-stepping at counter  STS  Seated tband clams  Seated DF          Subjective     History of Present Illness      Date of Evaluation: 05/16/2020    Reason for Referral/Chief Complaint: 79 y.o. year old female presents to outpatient physical therapy with complaints of ***      Subjective: Pt reports ***    Going to see an Associate Professor.      Foot drop worsening.      Wearing R knee brace.      Hospitalized 4-6 weeks for bipolar depression.  Discharged July 16th?    Colleen daughter.  Considering [facility] all inclusive care for elderly.      Slow mobility at home.  Early 2021 fall.  Live alone.  Ramp up front.  Daughter over daily to help.  Aide going to be coming a couple days/week to help.      Mobility depends on fatigue.  Fear of falling.      Rheumatic fever as kid; hospitalized 5 weeks.            Pain                                  Pain Related Behaviors: none                          Precautions and Equipment  Precautions: None  Current Braces/Orthoses: None  Equipment Currently Used: None    Social Support  Communication Preference: verbal, written and visual   Barriers to Learning: No Barriers      Diagnostic Tests      X-ray: abnormal (see below)                          Diagnostic Test Comments: 11/17/2019: Osteopenia, bilateral genu varus and advanced tricompartmental osteoarthritis.                Objective    Gait Analysis:  ***    Static Posture & Observation:   Sitting: ***  Standing: ***  Body Type: ***  Skin assessment, muscle tone/bulk & bony contour: ***  Edema: ***  Pelvic position & landmarks: ***    Palpation/Segmental Motion/Joint Play:  Lumbar: ***  Thoracic:   SIJ:   Coxofemoral:   Tibiofemoral:   Patellofemoral:   Proximal Tibiofibular:   Distal Tibiofibular:   Talocrural:   Tender to palpation: ***    ?? 0: No tenderness  ?? I: Tenderness to palpation WITHOUT grimace or flinch  ?? II: Tenderness WITH grimace &/or flinch to palpation  ?? III: Tenderness with WITHDRAWAL (+ Jump Sign)  ?? IV: Withdrawal (+ Jump Sign) to non???noxious stimuli (ie. superficial palpation, pin prick, gentle percussion)      Lumbar Spine ROM  Motion No Loss  (0%) Min Loss  (1???33%) Mod Loss  (34-65%) Major Loss  (66-100%) ROM Symptoms    Flexion ***  Extension         Sideglide Right         Sideglide Left         Sidebend Right         Sidebend Left         Rotation Right         Rotation Left          Repeated Flexion         Repeated Extension         Repeated Side Glide           Hip ROM  Motion L  AROM L  PROM R  AROM R  PROM Symptoms/End Feel Movement Analysis    Hip flexion ***        Hip adduction         Hip abduction         Hip extension         Hip IR (seated)         Hip ER (seated)         Hip IR (prone)         Hip ER (prone)           Knee ROM  Motion L  AROM L  PROM R  AROM R  PROM Symptom/  End Feel Movement Analysis   Flexion ***        Extension            Foot/Ankle ROM:  Motion L AROM R PROM L AROM L PROM Symptom/End Feel Movement Analysis   Dorsiflexion ***        Plantarflexion          Eversion         Inversion         Great Toe Ext           Impaired Flexibility:   Thoracolumbar: ***  Hip:   Knee:   Ankle:  Trunk:    Strength (MMT) & Handheld Dynamometer:  LE MMT Left   (/5) Right  (/5) HHD  (lbs) Symptoms Movement Analysis   Hip Flex:  (L2) ***       Hip Ext:        Hip ABD:        Hip ADD:        Hip IR:        Hip ER:        Knee Ext:  (L3)        Knee Flex: (S2)        Ankle DF:  (L4)        Ankle PF:  (S1)        Ankle Inv:        Ankle Ev:        Great Toe Ext: (L5)        Anterior Core        Lateral Core        Posterior Core        Dynamic Abdominal Endurance Test        Dynamic Extensor Endurance Test        Double Straight Leg Lowering Test         Internal/External Abdominal Oblique Test        Dynamic Horizontal Side Support (Side Bridge) Test        Back Rotators/MultifidusTest            Lumbar Spine Special Tests:  Left Right Notes   Tests for Neurological Dysfunction:      Babinski Test      Bowstring Wynetta Emery or Popliteal Pressure Sign) Test      Brudzinski-Kernig Test      Compression Test      Femoral Nerve Traction Test      Flip Sign      Gluteal Skyline Test Knee Flexion Test      Naffziger Test      Oppenheim Test      Prone Knee Bending Roby Lofts) Test      Sitting Root Test      Slump Test       Straight Leg Raise Test      Valsalva Maneuver      Tests for Lumbar Instability:      Farfan Torsion Test      H & I Stability Tests      Lateral Lumbar Spine Stability Test      Passive Lumbar Extension Test      Pheasant Test      Prone Segmental Instability Test       Specific Lumbar Spine Torsion Test      Test of Anterior Lumbar Spine Instability       Test of Posterior Lumbar Spine Instability      Tests for Joint Dysfunction:      McKenzie's Side Glide Test      Milgram's Test       One-Leg Standing Irena Cords) Lumbar Ext Test      Quadrant (Ext Quadrant) Test      Schober Test      Yeoman's Test      Muscle Dysfunction:      Beevor Sign      Tests for Intermittent Claudication:       Bicycle Test of Wonda Olds      Stoop Test      Treadmill Test      Tests for Malingering:      Burns Test      Hoover Test          Hip Special Tests:   Left Right Notes   Tests for Hip Pathology:      Bryant's Triangle      Craig's Tests       Dial Test of the Hip      Flexion-Adduction Test      Foveal Distraction Test       Hip Scour Test      Log Roll Test      [facility] Hip Extension Sign      Nelaton's Line      Patrick's Test (FABER or Figure 4)      Rotational Deformity Test      Stinchfield Resisted Hip Flexion Test      Torque Test      Tests for Hip Impingement:       Anteroposterior Impingement Test      Posteroinferior Impingement Test      Dynamic IR Impingement Test (DIRI)      Dynamic ER Rotational Impingement Test      Tests for Labral Lesions:      Anterior Labral Tear Test (FADDIR)      Posterior Labral Tear Test      Tests for Femoral Neck Stress Fractures:      Fulcrum Test of the Hip      Heel Strike Test      Patellar Pubic Percussion Sign  Pediatric Test for Hip Pathology:      Abduction Test (Harts' Sign)      Barlow's Test      Galeazzi Sign Lavena Bullion or Galeazzi Test)      Ortolani's Sign      Telescoping Sign (pison or Dupuytren's)      Tests for Leg Length:      True Leg Length      Weber-Barstow Maneuver       Apparent/Funtional Shortening      Standing (functional) Leg Length      Muscle Tightness or Pathology:       Ober's Test for TFL/ITB      Ely's test for Rectus Femoris       Rectus Femoris Contracture      Hip Rotator Tightness       90/90 Straight Leg Raise      Abduction Contracture      Adduction Contracture       Thomas Test for Hip Flexor Contracture      Bent-Knee Test for Proximal Hamstrings      Hamstrings Contracture Test      Lateral Step Down Maneuver       Noble Compression for ITB      Phelps' Test for Gracilis      Piriformis Test      Prone Lying Test for ITB      Sign of the Buttock      Trendelenburg Sign      Tripod Sign      Taking Off the Shoe Test   for Biceps Femoris      Thoracolumbar Fascia Length        Pelvis Special Tests:   Left Right Notes   Stress Tests (Passive) of the SIJ      Approximation Test      Gapping Test      Ipsilateral Prone Kinetic Test      Knee-to-Shoulder Test      Passive Extension & Medial Rotation of Ilium on Sacrum      Passive Flexion and Lateral Rotation of Ilium on Sacrum      Prone Gapping Test      Sacral Thurst Test      Thigh Thrust Test      Laslet CPR for SIJ Involvement:      2/4- 1) approximation, 2) gapping, 3) sacral thrust, & 4) thigh thrust, or 3+/6 - 5) Gaenslen's, 6) pain palpation of sacral sulcus      Neurological Involvement:      Prone Knee Bending (Nachlas)  Test      Straight Leg Raising (Lasegues's) Test      Active SLR Test       Prone Hip Extension Test      Active Tests for SIJ Involvement:      Flamingo Test      Gillet's (Sacral Fixation) Test       Gaenslen's Test      Goldthwait's Test      Ipsilateral Anterior Rotation Test      Laguere's Sign      Mazion's Pelvic Maneuver (Standing Lunge)      Luisa Hart Test      Piedallu's Sign      Supine-to-Sit Test      [facility] Test Tests for Limb Length:      Functional Limb Length Test      Leg Length Test        Neurodynamics:  ***    Sensation:   {  Sensation Assessment:48652::Not Tested}    Reflexes:   {Reflex :48653::Not Tested}    0: absent reflex  1+: trace, or seen only with reinforcement  2+: normal  3+: brisk  4+: nonsustained clonus (i.e., repetitive vibratory movements)  5+: sustained clonus    Functional Outcome Measure:  FOTO: ***  :   5 x sit to stand:  TUG:     Functional Movement Assessment:     Transfers: ***    Bilateral squat:  Frontal: ***  Sagittal: ***    Single Leg Stance:  ***    Single Leg Squat:  ***    Step down test:  L: ***  R: ***    Balance:  ***      Patient Education:   Throughout session patient educated regarding the following: Role of PT in Rehabilitation, HEP, importance of therapy, posture, body mechanics, body awareness, treatment plan, back care and Indications/Contraindications to Exercises. Patient demonstrated and verbalized agreement and understanding.  See treatment rendered section above for additional educational topics reviewed with patient, family and/or caregiver.      Communication/consultation with other professionals:  medicare Cert/POC sent to referring practitioner  Initial note sent to referring practitioner     Equipment provided/recommended:   written HEP                        I attest that I have reviewed the above information.  Signed: Pauletta Browns, PT  05/16/2020 4:00 PM

## 2020-05-18 ENCOUNTER — Ambulatory Visit: Admit: 2020-05-18 | Discharge: 2020-05-19 | Payer: MEDICARE

## 2020-05-18 DIAGNOSIS — K582 Mixed irritable bowel syndrome: Principal | ICD-10-CM

## 2020-05-18 DIAGNOSIS — M792 Neuralgia and neuritis, unspecified: Principal | ICD-10-CM

## 2020-05-18 DIAGNOSIS — E639 Nutritional deficiency, unspecified: Principal | ICD-10-CM

## 2020-05-18 DIAGNOSIS — E0590 Thyrotoxicosis, unspecified without thyrotoxic crisis or storm: Principal | ICD-10-CM

## 2020-05-18 DIAGNOSIS — R1310 Dysphagia, unspecified: Principal | ICD-10-CM

## 2020-05-18 DIAGNOSIS — M4120 Other idiopathic scoliosis, site unspecified: Principal | ICD-10-CM

## 2020-05-18 DIAGNOSIS — R269 Unspecified abnormalities of gait and mobility: Principal | ICD-10-CM

## 2020-05-18 DIAGNOSIS — F259 Schizoaffective disorder, unspecified: Principal | ICD-10-CM

## 2020-05-18 DIAGNOSIS — M4802 Spinal stenosis, cervical region: Principal | ICD-10-CM

## 2020-05-18 NOTE — Unmapped (Addendum)
She has history of spine abnormalities and has previously seen [facility] and [facility] including a second opinion from [facility] neurosurgeon. Patient and duaghter have recently noticed more difficulty with right foot drop and PT uncovered issues with proprioception. I am Concerned that there is worsening of her known spinal disease. We had been hopeful that she could be a candidate for knee replacement. With her worsening function we talked about that our current focus should be on functional status and if we are able to get her closer to her previuos baseline we could reconsider this. She is likely not a good surgical canddiate right now, but knowing more about her options and prognosis for her spine can help [place] with functional status and modifications (bracing?) that could help. Referral placed today for spine clinic.

## 2020-05-18 NOTE — Unmapped (Addendum)
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 She has worsening weakness, pain, mobility. She would benefit from a semi-electric hospital bed as she requires frequent changes to body position while she is in bed and due to pain she requires positioning of the body to alleviate pain and assist with functional mobility that is not feasible in her current ordinary bed.     ASSESSMENT/PLAN:    Problem List Items Addressed This Visit        Digestive    Irritable bowel syndrome with both constipation and diarrhea     Has had some recent worsening of her symptoms. She is worried about nutrition changes in the setting of her underlying GI issues. I encouraged her and her daughter to reach out to her GI if things worsen or she becomes more worried.            Endocrine    Subclinical hyperthyroidism     Stable on last check. Has follow up with endo scheduled.             Nervous and Auditory    Neuralgia and neuritis    Relevant Orders    Ambulatory referral to Home Health    Ambulatory referral to Spine Center       Other    Abnormality of gait     She has had worsening of mobility and function since she was discharged from [facility]. Additionally due to being homebound, extremely difficult to get to outpatient PT. We will refer her back to [facility] for PT, OT. Additionally, she is having trouble with nutrition and eating, at least in part due to her dental issues. She is considering full liquid diet. We will ask speech and nutrition to see her as well. They are being evaluated for PACE in [place] which if approved would start Nov 1st. This would be a stepping stone to that program if available.          Relevant Orders    Ambulatory referral to Home Health    Ambulatory referral to Spine Center    Schizoaffective disorder (CMS-HCC)     Continues to struggle with mood and anxiety. Supported her and her duaghter during today's visit. We were pleased to find out that she can still see me 1-2 times per year if she starts PACE which I didn't know about.         Scoliosis, or kyphoscoliosis, idiopathic - Primary    Relevant Orders    Ambulatory referral to Home Health    Ambulatory referral to Spine Center    Spinal stenosis of cervical region     She has history of spine abnormalities and has previously seen [facility] and [facility] including a second opinion from [facility] neurosurgeon. Patient and duaghter have recently noticed more difficulty with right foot drop and PT uncovered issues with proprioception. I am Concerned that there is worsening of her known spinal disease. We had been hopeful that she could be a candidate for knee replacement. With her worsening function we talked about that our current focus should be on functional status and if we are able to get her closer to her previuos baseline we could reconsider this. She is likely not a good surgical canddiate right now, but knowing more about her options and prognosis for her spine can help [place] with functional status and modifications (bracing?) that could help. Referral placed today for spine clinic.  Relevant Orders    Ambulatory referral to Home Health    Ambulatory referral to Spine Center      Other Visit Diagnoses     Dysphagia, unspecified type        Relevant Orders    Ambulatory referral to Home Health    Poor nutrition        Relevant Orders    Ambulatory referral to Home Health           HEALTH MAINTENANCE ITEMS STILL DUE:  There are no preventive care reminders to display for this patient.    Follow-up: No follow-ups on file.    Future Appointments   Date Time Provider Department Center   05/19/2020  3:45 PM Hussam Rayna Sexton, MD [facility]   05/26/2020 11:00 AM [place] STEP PSYCH NURSE [place] [facility]   05/29/2020  1:30 PM [name], PMHNP [facility]   06/12/2020  1:30 PM [facility]   06/14/2020  1:40 PM Delorse Lek, MD [facility]   08/04/2020  9:50 AM Jimmie Molly, MD [facility]   I personally spent 45 minutes face-to-face and non-face-to-face in the care of this patient, which includes all pre, intra, and post visit time on the date of service.        Chief Complaint   Patient presents with   ??? Follow-up     follow up on health        SUBJECTIVE:    Cindy [name] is a 79 y.o. female that presents to clinic today regarding the following issues:  -on her list today, how is she doing?   -leg drop has seemed to get worse and is dragging the foot more and not taking as big of steps. Getting assessment for PACE on Monday. If it goes through November 1st would be the start date.   -struggling to eat due to her dentition. Considering trying liquid diet without her teeth to help with this.   -daughter feels she is moving backwards with function and that there are declines in multiple areas  -she has been very anxious  -she is having trouble relaxing.   -PT told them she has proprioception abnormalities on their eval. It has been a struggle to get her out of the house for appointments including PT.     Review of Systems as above      Cindy [name]  reports that she has never smoked. She has never used smokeless tobacco.    OBJECTIVE:    Cindy [name]  height is 156.2 cm (5' 1.5) and weight is 54.4 kg (120 lb). Her temporal temperature is 36.8 ??C (98.2 ??F). Her blood pressure is 102/64 and her pulse is 101.   Wt Readings from Last 3 Encounters:   05/18/20 54.4 kg (120 lb)   04/20/20 55.7 kg (122 lb 12.8 oz)   03/16/20 60.3 kg (133 lb)      PHQ-9 PHQ-9 TOTAL SCORE   03/29/2020 9   03/16/2020 11   02/26/2020 8   02/20/2020 5   02/13/2020 13   09/10/2017 2     PHQ-2 Score:      PHQ-9 Score:      Inocente Salles Score:        GAD-7 Score:     Physical Exam  Constitutional:       General: She is not in acute distress.     Appearance: She is not ill-appearing.  Comments: frail   Neurological:      Mental Status: She is alert.

## 2020-05-18 NOTE — Unmapped (Signed)
Stable on last check. Has follow up with endo scheduled.

## 2020-05-18 NOTE — Unmapped (Signed)
Continue working on your eyes.  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  Go back to home health with PT, OT, speech (swallowing), nutrition.   Try more liquid diet.  Referral to spine clinic to get mjore info on worsening symptoms.

## 2020-05-18 NOTE — Unmapped (Signed)
Cindy [name],  She transitioned to outpatient PT, but really is still homebound. She also is having worsening of her mobility, function, and now concerns about nutrition and eating/swallowing. I would like her to go back to home health with [facility] which she was using not too long ago. They are being evaluated for PACE which would start Nov 1st if approved so would be until then if possible. I put the order in  Thanks  [place]

## 2020-05-18 NOTE — Unmapped (Signed)
Has had some recent worsening of her symptoms. She is worried about nutrition changes in the setting of her underlying GI issues. I encouraged her and her daughter to reach out to her GI if things worsen or she becomes more worried.

## 2020-05-18 NOTE — Unmapped (Signed)
[facility] Administered Medication Refill Coordination Note      NAME:Nema E Aquilar DOB: 09-11-1940      Medication: invega  Day Supply: 28 days      SHIPPING      Next delivery from [facility] [phone]) to [facility] for S[place] is scheduled for 10/05.    Clinic contact: Conan Bowens    Patient's next nurse visit for administration: 10/08.    We will follow up with clinic monthly for standard refill processing and delivery.      Thuy Atilano Samella Parr  Specialty Pharmacy Technician

## 2020-05-19 ENCOUNTER — Ambulatory Visit: Admit: 2020-05-19 | Discharge: 2020-05-20 | Payer: MEDICARE

## 2020-05-19 NOTE — Unmapped (Signed)
Continues to struggle with mood and anxiety. Supported her and her duaghter during today's visit. We were pleased to find out that she can still see me 1-2 times per year if she starts PACE which I didn't know about.

## 2020-05-19 NOTE — Unmapped (Signed)
She has had worsening of mobility and function since she was discharged from [facility]. Additionally due to being homebound, extremely difficult to get to outpatient PT. We will refer her back to [facility] for PT, OT. Additionally, she is having trouble with nutrition and eating, at least in part due to her dental issues. She is considering full liquid diet. We will ask speech and nutrition to see her as well. They are being evaluated for PACE in [place] which if approved would start Nov 1st. This would be a stepping stone to that program if available.

## 2020-05-19 NOTE — Unmapped (Signed)
[facility] services has received a referral for (PT, ST, Dietician, OT) on 05/18/20/2021 at that time we did not have availability to start services and number of incoming referrals.  We are available to begin services on 05/25/20 with your approval.   Your response to this message with ???Approve or Not Approved??? will suffice as a verified verbal order to proceed.   Please contact [facility] at [phone] with any questions.   Thank you for the opportunity to provide Home Health services to your patient.

## 2020-05-20 ENCOUNTER — Encounter: Admit: 2020-05-20 | Discharge: 2020-06-15 | Payer: MEDICARE

## 2020-05-20 ENCOUNTER — Encounter: Admit: 2020-05-20 | Discharge: 2020-06-18 | Payer: MEDICARE

## 2020-05-20 ENCOUNTER — Encounter: Admit: 2020-05-20 | Discharge: 2020-05-20 | Payer: MEDICARE

## 2020-05-20 ENCOUNTER — Inpatient Hospital Stay: Admit: 2020-05-20 | Discharge: 2020-06-15 | Payer: MEDICARE

## 2020-05-20 NOTE — Unmapped (Signed)
Assessment:     Diagnosis ICD-10-CM Associated Orders   1. Nuclear senile cataract, left  H25.12    2. Senile ectropion of left lower eyelid  H02.135    3. Sicca syndrome with keratoconjunctivitis (CMS-HCC)  M56.46      79 year old lady her for symptoms of filmy vision  more on the left eye     Patient has visually significant cataract in the right eye , will need surgery but:  There is significant laxity of both lower and upper lids with mucus collection in the lower lids due to increased exposure and poor tear film     Patient was evaluated by Dr. Dell Ponto last month with possible lid repair         Plan:    Lids scrubs with baby shampoo   Hot compresses   Artificial teras   Return after lid repair for cataract surgery sign up

## 2020-05-22 MED ORDER — ATROPINE 1 % EYE DROPS
0 refills | 0 days | Status: CP
Start: 2020-05-22 — End: ?

## 2020-05-23 MED FILL — INVEGA SUSTENNA 156 MG/ML INTRAMUSCULAR SYRINGE: INTRAMUSCULAR | 28 days supply | Qty: 1 | Fill #1

## 2020-05-23 MED FILL — INVEGA SUSTENNA 156 MG/ML INTRAMUSCULAR SYRINGE: 28 days supply | Qty: 1 | Fill #1 | Status: AC

## 2020-05-24 DIAGNOSIS — M4802 Spinal stenosis, cervical region: Principal | ICD-10-CM

## 2020-05-24 DIAGNOSIS — M4120 Other idiopathic scoliosis, site unspecified: Principal | ICD-10-CM

## 2020-05-24 DIAGNOSIS — R269 Unspecified abnormalities of gait and mobility: Principal | ICD-10-CM

## 2020-05-24 DIAGNOSIS — M792 Neuralgia and neuritis, unspecified: Principal | ICD-10-CM

## 2020-05-24 DIAGNOSIS — R1310 Dysphagia, unspecified: Principal | ICD-10-CM

## 2020-05-24 DIAGNOSIS — E639 Nutritional deficiency, unspecified: Principal | ICD-10-CM

## 2020-05-24 NOTE — Unmapped (Signed)
[facility]   Care Management Progress Note             Date of Service:  05/24/2020      Service:  Care Management - phone    Purpose of contact:         Discussed the following with Neva Seat :      Barriers to Care:  N/A    Provider/Care Partner(s)  to follow up on:   Follow up on Home Health Services     Health Maintenance:  There are no preventive care reminders to display for this patient.    CM followed with patient daughter as it relates to [employer]. Patient daughter shared that services began yesterday 05/23/2020.      No additional questions or concerns at this time.     Additional Information/Plan:  Patient daughter  provided my direct contact information and encouraged to contact me should additional needs arise.    Minutes spent providing outreach: 10    Rayven Hendrickson Isaac Bliss  [facility]  [facility]

## 2020-05-26 ENCOUNTER — Institutional Professional Consult (permissible substitution): Admit: 2020-05-26 | Discharge: 2020-05-27 | Payer: MEDICARE

## 2020-05-26 MED ADMIN — paliperidone palmitate (INVEGA SUSTENNA) 156 mg/mL injection 156 mg: 156 mg | INTRAMUSCULAR | @ 15:00:00

## 2020-05-26 NOTE — Unmapped (Signed)
Administered long-acting injectable today, 05/26/2020. See MAR for additional documentation. Cindy [name]

## 2020-05-29 ENCOUNTER — Ambulatory Visit
Admit: 2020-05-29 | Discharge: 2020-05-30 | Payer: MEDICARE | Attending: Psychiatric/Mental Health | Primary: Psychiatric/Mental Health

## 2020-05-29 MED ORDER — SERTRALINE 50 MG TABLET
ORAL_TABLET | 0 refills | 0 days | Status: CP
Start: 2020-05-29 — End: ?

## 2020-05-29 NOTE — Unmapped (Signed)
Follow-up instructions:  -- Please continue taking your medications as prescribed for your mental health.   -- Do not make changes to your medications, including taking more or less than prescribed, unless under the supervision of your physician. Be aware that some medications may make you feel worse if abruptly stopped  -- Please refrain from using illicit substances, as these can affect your mood and could cause anxiety or other concerning symptoms.   -- Seek further medical care for any increase in symptoms or new symptoms such as thoughts of wanting to hurt yourself or hurt others.     Contact info:  Life-threatening emergencies: call 911 or go to the nearest ER for medical or psychiatric attention.     Issues that need urgent attention but are not life threatening: call the clinic at [phone].    Non-urgent routine concerns, questions, and refill requests: please call the clinic for assistance.     Regarding appointments:  - If you need to cancel your appointment, we ask that you call [phone] at least 24 hours before your scheduled appointment.  - If for any reason you arrive 15 minutes later than your scheduled appointment time, you may not be seen and your visit may be rescheduled.  - Please remember that we will not automatically reschedule missed appointments.  - If you miss two (2) appointments without letting us know at least 24 hours in advance, you will likely be referred to a provider in your community.  - We will do our best to be on time. Sometimes an emergency will arise that might cause your clinician to be late. We will try to inform you of this when you check in for your appointment. If you wait more than 15 minutes past your appointment time without such notice, please speak with the front desk staff.    In the event of bad weather, the clinic staff will attempt to contact you, should your appointment need to be rescheduled. Additionally, you can call the Patient Weather Line [phone] for system-wide clinic status    For more information and reminders regarding clinic policies (these were provided when you were admitted to the clinic), please ask the front desk.    Maija Biggers, MPH, PMHNP-BC  Department of Psychiatry  [facility]  [address]  [address]  [place], [place] [address]  Phone: [phone]

## 2020-05-29 NOTE — Unmapped (Unsigned)
[facility]  Psychiatry   Established Patient E&M Service - Outpatient       Assessment:    Cindy [name] presents for follow-up evaluation.     Identifying Information:  Cindy [name] is a 79 y.o. female with a history of PPD, schizoaffective disorder and hyperthyroidism who was admitted involuntarily to the [facility] Gerops[facility]ry unit 6/18- 03/01/2020, who presents for evaluation of mood, medication and to establish care. . Endorsing depression related to recent decline of physical abilities. This is Cindy [name] first outing in a wheel chair. Today, Cindy [name] is endorsing anhedonia, low motivation and mood, and sialorrhea that started after receiving [place] injection. Daughter coordinates and provides transportation to appointments.     Risk Assessment:  A full psychiatric risk assessment was conducted on 03/16/2020 and risks do not appear significantly changed from that visit.   While future psychiatric events cannot be accurately predicted, the patient does not currently require acute inpatient psychiatric care and does not currently meet [facility] involuntary commitment criteria.      Plan:    Problem: Schizoaffective disorder  Status of problem: chronic and stable  Interventions: Continue Invega Sustenna 156 mg q 28 days.   Discussed possible risk of side effects of Invega including hyperglycemia, NMS, seizures, EPS, hyperprolactinemia, diabetes, dyslipidemia, tardive dyskinesia, sedation, hyper salivation, orthostatic hypotension, tachycardia, injection site reactions.   - considered adding antidepressant to help support depressed mood if starting therapy (theripist at [facility] starting next week) and increasing LAI is ineffective for mood support.     Problem: Adjustment disorder  Status of problem:  new problem to this provider  Interventions: Recently made appt with new therapist. Pt recently experienced significant change in mobility and independence. Also connected to PT that is helping with independence.       Problem: Sialorrhea  Status of problem: improved or improving  Interventions: continue ipratropium bromide SL 1-2 sprays up to 4 x per day  Discussed possible side effects of ipratropium bromide including hypersensitivity reactions, bronchospasm, bladder neck obstruction, intraocular pressure, prostatic hyperplasia, urinary retention, abnormal taste in mouth, MI, dry nasal mucosa, sinusitis, CVA    Problem: Depression  Status of problem:  not improving as expected  Interventions: - start sertraline  Discussed possible risk of side effects of sertraline including seizure, induction of mania, activation of SI, sexual dysfunction, GI, sedation, HA dizziness, hyponatremia, serotonin syndrome.      Problem: r/o Mild neurocognitive impairment  Status of problem:  new problem to this provider  Interventions: MOCA performed 02/11/20 score:??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 Will repeat at future follow up to reevaluate.     Problem: Insomina  Status of problem:  chronic and stable  Interventions: - continue melatonin 3 mg OTC  -may also take OTC benadryl prn. Aware of risk of anticholinergic side effects.     Problem: Hx of medication side effects  Status of problem:  chronic and stable  Interventions:Genesight test 03/16/2020     Risks/benefits and indications for treatment with medications above were discussed with the patient. The patient asked appropriate questions, acknowledged understanding of answers, and provided informed consent to initiation&??continuation of medications above.??  ??  Return to clinic appointment: in 3-4 weeks or earlier as needed by patient       Psychotherapy provided:  No billable psychotherapy service provided but brief supportive therapy was utilized.    Patient has been given this writer's contact information as well as the [facility] Psychiatry urgent line  number. The patient has been instructed to call 911 for emergencies.      Subjective:    Interval History:   I'm high strung and so was my Grandmother. Reports sialorrhea and sleep have improved. Started taking diphenhydramine 50 mg for sleep with good results. Taking acetaminophen q 8 hours for pain which is disrupting sleep. Buproprion resulted in increased anxiety and restlessness and discontinued after 2 doses. Feels like uneasiness and mood have been worse since decreasing Invega sustenna. Would like to start oral paliperidone bridge until next scheduled injection next week. [name] reports that Cindy [name] recent loss of physial ability and fear of falling is increasing her anxiety. Physically. Cindy [name] looks very guarded. I feel like I am in kindergarten again. Everything is so different. Would like to start trial of SSRI for mood and anxiety symptoms.   Past medication history: Fluoxetine (not effective), tegretol (anaphylaxis), lithium (dystonia), paliperidone (hypersalivation)       Objective:    Mental Status Exam:  Appearance:    Appears stated age and Clean/Neat   Motor:   No abnormal movements   Speech/Language:    Normal rate, volume, tone, fluency   Mood:   Depressed   Affect:   Euthymic   Thought process and Associations:   Logical, linear, clear, coherent, goal directed   Abnormal/psychotic thought content:     Denies SI, HI, self harm, delusions, obsessions, paranoid ideation, or ideas of reference   Perceptual disturbances:     Denies auditory and visual hallucinations, behavior not concerning for response to internal stimuli     Other:            Visit was completed face to face.        [name], PMHNP minutes on pre- and post-visit activities on the date of service.     The patient was physically located in [place] or a state in which I am permitted to provide care. The patient and/or parent/guardian understood that s/he may incur co-pays and cost sharing, and agreed to the telemedicine visit. The visit was reasonable and appropriate under the circumstances given the patient's presentation at the time.    The patient and/or parent/guardian has been advised of the potential risks and limitations of this mode of treatment (including, but not limited to, the absence of in-person examination) and has agreed to be treated using telemedicine. The patient's/patient's family's questions regarding telemedicine have been answered.     If the visit was completed in an ambulatory setting, the patient and/or parent/guardian has also been advised to contact their provider???s office for worsening conditions, and seek emergency medical treatment and/or call 911 if the patient deems either necessary.      [name], PMHNP

## 2020-05-30 ENCOUNTER — Ambulatory Visit: Admit: 2020-05-30 | Discharge: 2020-05-31 | Payer: MEDICARE

## 2020-05-30 ENCOUNTER — Ambulatory Visit
Admit: 2020-05-30 | Discharge: 2020-05-31 | Payer: MEDICARE | Attending: Physical Medicine & Rehabilitation | Primary: Physical Medicine & Rehabilitation

## 2020-05-30 DIAGNOSIS — M5441 Lumbago with sciatica, right side: Secondary | ICD-10-CM

## 2020-05-30 DIAGNOSIS — R269 Unspecified abnormalities of gait and mobility: Principal | ICD-10-CM

## 2020-05-30 DIAGNOSIS — M792 Neuralgia and neuritis, unspecified: Principal | ICD-10-CM

## 2020-05-30 DIAGNOSIS — G8929 Other chronic pain: Principal | ICD-10-CM

## 2020-05-30 DIAGNOSIS — M1712 Unilateral primary osteoarthritis, left knee: Principal | ICD-10-CM

## 2020-05-30 DIAGNOSIS — M1711 Unilateral primary osteoarthritis, right knee: Principal | ICD-10-CM

## 2020-05-30 DIAGNOSIS — M4120 Other idiopathic scoliosis, site unspecified: Principal | ICD-10-CM

## 2020-05-30 DIAGNOSIS — M5136 Other intervertebral disc degeneration, lumbar region: Principal | ICD-10-CM

## 2020-05-30 DIAGNOSIS — M533 Sacrococcygeal disorders, not elsewhere classified: Principal | ICD-10-CM

## 2020-05-30 DIAGNOSIS — M4802 Spinal stenosis, cervical region: Principal | ICD-10-CM

## 2020-05-30 DIAGNOSIS — M21371 Foot drop, right foot: Principal | ICD-10-CM

## 2020-05-30 MED ORDER — DICLOFENAC 1 % TOPICAL GEL
Freq: Four times a day (QID) | TOPICAL | 3 refills | 32 days | Status: CP
Start: 2020-05-30 — End: 2021-05-30

## 2020-05-30 NOTE — Unmapped (Signed)
Patient ordered for an lumbar epidural injection per Dr Oneida Arenas. Education provided on what to expect for the injection. Reviewed medications to stop taking if applicable to patient. Pt aware to stop fish oil and voltaren 2 days prior to the injection. Pt aware they will need a driver for the injection and to arrive 30 minutes prior to the injection appointment time. Reviewed if the patient develops a illness/ infection prior to the injection to notify our office. Patient verbalizes understanding of instructions and provided scheduler phone number if he does not receive a call to schedule the injection. Provided Spine Center RN follow up phone number 2 weeks post injection.

## 2020-05-30 NOTE — Unmapped (Signed)
Dear Ms. Birdie Riddle,    Thank you for coming to see [place] today. It was a pleasure to see you. This summary reviews the goals and plans we discussed at your visit today.     Below, you will see: a) your working diagnosis b) your treatment plan and c) your next steps and followup plan.    We care about your quality of life and are committed to helping optimize your functionality.     DIAGNOSIS:   1. Chronic bilateral low back pain with right-sided sciatica    2. Scoliosis, or kyphoscoliosis, idiopathic    3. Spinal stenosis of cervical region    4. Neuralgia and neuritis    5. Abnormality of gait    6. DDD (degenerative disc disease), lumbar    7. Right foot drop    8. Coccyx pain    9. Arthritis of knee, right    10. Arthritis of knee, left       TREATMENT PLAN:   ?? Get x-rays today of back and tailbone.   ?? Schedule updated MRI L-spine.  ?? Follow-up with Spine Surgery at [facility] or [facility] to see if you are a surgical candidate given progressively worsening symptoms.   ?? Previously they believed risks outweighed benefits.   ?? Schedule repeat lumbar epidural steroid injection to try to provide temporary relief.   ?? Continue home activities and home exercises.   ?? Continue sertaline for now, ask Dr. Vergia Alcon to see if Cymbalta is a future option - can help with both pain as well as mood.   ?? Continue topical medications.   ?? Updated prescription for Voltaren gel = topical anti-inflmmatory to use on your knee.  ?? Will defer on knee injection for now until you can see the Orthopedic surgeon.   ?? Updated referral provided.     NEXT STEPS/FOLLOW UP:   ?? Follow-up here in 2 months, after MRI and injection.     You may contact me through MyChart of call our office with any questions.     If you develop any red flag signs such as new or progressive motor weakness, sensory deficits, saddle anesthesia (groin numbness), bowel/bladder dysfunction, gait/coordination disturbance, weight loss, or night pain, you should call our office and/or seek prompt evaluation at the nearest ER.    All my best,    Asa Saunas, MD  Assistant Professor - PM&R  Interventional Spine Specialist - [facility]  Staff Physiatrist [facility]   [facility]

## 2020-05-30 NOTE — Unmapped (Signed)
[facility]   Follow-Up Note    Patient Name:Cindy [name]  MRN: [id number]  DOB: [birth year]  Age: 79 y.o.   Date: 05/30/2020  Physician: Murvin Natal, MD    ASSESSMENT & PLAN:     79 y.o. old woman with osteopenia, right knee OA, right foot drop & lumbar radiculopathy being co-managed with [facility] for ESI's. MRI Spine from September 2019 with severe neural foraminal narrowing is also noted on the right at L4-L5 and severe central canal narrowing L2-L5. Saw neurosurgery in November 2013 & concerned for her bone health when considering operative intervention, but her osteopenia has since been treated. She also remains progressively symptomatic. 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    06/05/18: MRI with progressive stenosis and EMG with progressive right L5 radiculopathy which matches her foot drop. She has not improved with extensive conservative care (PT, traction, chiropractor, ESIs, and several assistive devices). She is open to repeat surgical assessment. She again had several other adjunct ideas, but we discussed how these are not addressing her mechanical problem.   Discussed also that given timeline, surgery may not allow complete resolution of her foot drop, but goal would be to prevent further decline. Fortunately, she has used e-stim for her right Tib Ant and has maintained muscle bulk that may help provide return of function once nerve decompressed.     07/31/18: Follows-up after seeing NSG at [facility] for her foot drop, who did no recommend surgical intervention for her lumbar spine. Before then, she brought another provider into the mix and saw a spine specialist at [facility] who ordered an MRI of the C-spine with moderate stenosis. No surgery was indicated for her C-spine either, per [facility]. Has not followed up with [facility]. She saw her PCP a which time she indicated a desire to switch to home PT, in part given concerns with driving.   Today, her main concern appears to be function in her hands, though this appears more related to arthritis and skin rash than radicular pain and weakness.   Discussed need to consolidate care, as she has been seeking more data points and getting fragmented care at [facility], and [facility], which I believe is adding to confusion and redundancy in treatment.     10/16/2018: Patient returns to clinic for an evaluation. She has been seen by [facility] and is not a good surgical candidate. She has multiple questions about their conversation regarding surgery. She complains today of multiple areas of pain, which have been addressed by her chiropractor by gentle techniques including spinal traction, myofascial release, and manipulation. Today she is seeking alternative ways to manage her pain, including acupressure/puncture. She has not started [facility] PT due to having multiple agencies at once and not answering phone calls. She reports her bracing is ill-fitting, but did not bring it with her today.     05/30/2020: Follows up for progressive neurologic and functional decline. Her right foot drop has worsened. Her right > left knee pain has worsened. As a result her mobility has worsened and despite knee and ankle braces and walker/cane, mobility is more strenuous and she fatigues more easily and has avoided moving for fear of falling, further compounding her deconditioning.   She has known severe lumbar stenosis, but previously was told she was a poor surgical candidate at both [facility] and [facility] given her likely need  for extensive surgery, but that was while her symptoms were stable, and she has had progressive worsening, so will get updated MRI and have them follow-up with them again. She has had temporary relief with ESI in the past, so will repeat. She has already failed extensive PT, bracing, e-stim, chiropractic care, etc. Also significantly limited by knee pain and instability, has severe OA bilaterally, minimal relief with bracing and injections. Encouraged follow-up with the total joint team.   They also asked about RLS, but she has failed gabapentin and requip. Discussed this and her constipation and other symptoms are likely related to lumbar stenosis, but again may not have a definitive treatment. Will reassess after updated MRI and surgical reassessment.     DIAGNOSIS:     ICD-10-CM   1. Chronic bilateral low back pain with right-sided sciatica  M54.41    G89.29   2. Scoliosis, or kyphoscoliosis, idiopathic  M41.20   3. Spinal stenosis of cervical region  M48.02   4. Neuralgia and neuritis  M79.2   5. Abnormality of gait  R26.9   6. DDD (degenerative disc disease), lumbar  M51.36   7. Right foot drop  M21.371   8. Coccyx pain  M53.3   9. Arthritis of knee, right  M17.11   10. Arthritis of knee, left  M17.12      TREATMENT PLAN:   ?? Get x-rays today of back and tailbone.   ?? Schedule updated MRI L-spine.  ?? Follow-up with Spine Surgery at [facility] or [facility] to see if you are a surgical candidate given progressively worsening symptoms.   ?? Previously they believed risks outweighed benefits.   ?? Schedule repeat lumbar epidural steroid injection to try to provide temporary relief.   ?? Continue home activities and home exercises.   ?? Continue sertaline for now, ask Dr. Vergia Alcon to see if Cymbalta is a future option - can help with both pain as well as mood.   ?? Continue topical medications.   ?? Updated prescription for Voltaren gel = topical anti-inflmmatory to use on your knee.  ?? Will defer on knee injection for now until you can see the Orthopedic surgeon.   ?? Updated referral provided.     NEXT STEPS/FOLLOW UP:   ?? Follow-up here in 2 months, after MRI and injection.     In room 3:35. Out of room: 4:30.  Net: 55 minutes.   Documentation/review: 15 minutes.   Total time: 70 minutes.     SUBJECTIVE:     Chief complaint: Knee pain, foot drop    History of present Illness:  Pt is a 79 y.o. female seen for follow-up regarding RIGHT FOOT DROP. Re-referred for 79 yo F previous patient of spine center who now has worsening right sided foot drop, loss of proprioception. Working with PT/OT. Currently would not be a good surgical candidate, but would like some clarity on her diagnosis and prognosis to help work with PT/OT and improve function as much as possible.     05/30/20: Follows-up for ongoing back pain, and a laundry list of questions. Goal is to do anything possible to help her QOL and function, as well as get a better understanding of the cause of her worsening symptoms.   She follows up given significant changes over the past 5 months. Her daughter (PT?) reports that her right foot drop has gotten worse, can barely dorsiflex at all. Given her knee issues, her gait is altered and her glutes are atrophied and her  piriformis is strained. Her knees are also getting worse, has had bone on bone arthritis for years.  They want to know if there is anything we can do for the foot drop, and if not, if we can do anything for the knees.   Over the past week, her pain and altered biomechanics. Cause her to fatigue to more easily. Its hard for her to even get her right leg into proper position for standing/walking given decreased sensorimotor input.  Also worsening scoliosis.   Also having worsening pain in the ielocecal valve, they note concern about the Auberch plexus being compressed. Constant constipation, drinks Peptobismal or Mylanta regularly.  Has been diagnosed with SIBO. Has preconsciousmess of her ability to know when she has to have a BM, but remains continent of bladder, though has retention issues.   Only taking Tylenol 650mg  TID and lidocaine patches on back/piriformis, arnica, and CBD oils.   Had knee injection in the past with modest relief. Was told she would be a candidate for a knee surgery, but she was not sure her other medical health would allow it.  Her right knee bothers her more than left. However, they wanted to focus on spine before the knee.   Neck pain, hands get tingling, was told she was not a candidate for neck surgery at [facility]. They were also seeing her for the back, but they felt she maxxed out the amount of steroid injections she could have for the back, but had found these very helpful in the past. She felt it helped with strength as well as pain, would last for several months at a time (would get 3-4 per year). They also noted that surgery would requite multiple levels of decompression and may cause instability and worsening scoliosis, so may need fusion (same opinion with Dr. Kevan Rosebush in Dec. 2019). Had also done chiropractic work on the back in the past.   Never had any injections for the neck.   Also having RLS, does not tolerate gabapentin, nor requip. Wish to avoid TCAs given her constipation. Just started sertraline.     10/16/2018: She is here for follow-up of her back pain. She states she continues to have multiple areas of pain, including low back and right buttocks. Her pain sometimes wakes her at night, although she also describes awakening due to needing to urinate. No new weakness, bowel/bladder changes, saddle anesthesia. No recent falls. Feels very fatigued lately; however, she is managed for her thyroid misdiagnosis with Endocrinology in [place]. She is also followed by her PCP. She has tried lidocaine patches, aspercreme, EMG unit without good relief. She sees her chiropractor once per month, but would like to know other ways she can receive care for her pain. She has HH come to her home for an evaluation, but has not heard back from them. This is a different agency than [place]; however, she does state she has had phone difficulties since she does not answer her phone routinely out of fear for fraud issues. She states she saw [facility] and was told she is not a good candidate because she likely cannot tolerate the rehab. She has multiple surgical questions including what he meant by this statement. She also feels she needs new equipment. She states she wears a knee brace but it does not allow for knee flexion. She states it does have hinges. She did not bring today. She also has a few AFOs, one being custom, but does not like it due to restriction with  being able to walk uphill. Today she has her OTC solid AFO.     07/31/18: Missed her appointment this morning - showed up after her appointment time ended. She was able to be seen.   Follows-up after seeing NSG and an outside spine provider who ordered MRI of her C-spine. She also recently saw PCP where she voiced desire to switch to home PT.  She drove to today's visit, but believe's she would benefit from home assessment since that's where most of her issues and falls have been - notes difficulty with ADLs at home and wants home assessment.   She reports that the [facility] and [facility] did not believe she would benefit from surgery. The note indicated from [facility] that it would be her decision. Dr Ova Freshwater also ordered the MRI of the neck and wanted to see her back after that. She has not made that appointment. She is concerned with ongoing functioning of her hands. She reports some stiffness and arthritis in her hands, which I discussed is not coming from her neck. She does note some weakness and having a harder time opening jars. Today, she reports pain from 4-9/10, today 6/10.  She does not wish to discussed her experience with [facility] NSG, noting she did not like what they had to say since some of her friends her age were able to have surgery. She again notes spinal traction and ESI - most recently provided 1 night of better sleep.    More curt today, wants to to discuss neck, shoulders, arms, hands, low back, knee, and foot drop. Notes generalized deconditioning and goal of increased global function. No clear central issue that she believes is decreasing her desire level of activity, an no clear plan to achieve it. Does request acupuncture, a chance for surgery, and continue therapy. ROS is again near pan-positive.     06/05/18:   She is here to review EMG and discuss next steps in management. Had traction yesterday and states she's the tallest today that she has been in years. Continues with chiropractor. Knee feels more stable in hinged knee brace. Still does not like articulated AFO as she feels she gets less push off while walking. Today, using soft plastic AFO, SPC, Rollater, and hinged right knee brace. Asking about pool therapy. Asking about use of knee brace in the pool.   Rates her pain as 5/10.     ROS notable for constipation, anxiety, joint pain, diarrhea, muscle pain, ithcing, weakness, irritability, leg cramps, skin problems, sleep problems, numbness/tingling. Denies bowel/bladd incontinence.     05/08/18:   Patient recently saw [facility] on 05/01/18 & had ESI performed with significantly helped with her pain & spasms.  She also saw her chiropractor on Thursday 9/12 for traction and had spinal injection performed on Friday.    She does bring her spring-leaf AFO & her articulated AFO for right foot present today in clinic.  She still wears them sparingly and does not feel as though it helps with her ankle movement.    She is proud of herself because she went to visit her sister in [place] was able to traverse 29 stairs without significant pain in her right knee.  She has been wearing her hinged knee brace.     She continues to ambulate using 2 single-point canes and furniture walk when within the household.  She uses a Rollator when out in the community so that she can take rest breaks.  She asked questions  today about L[place]strand crutches and need for compression stockings.    She had MRI performed on 04/28/2018, report listed below.  She would like to discuss findings today in clinic. She reports ongoing/worsening foot drop and spasms that are relieved with BM.     We reviewed past medical records today.     Previously   79 year old woman with osteopenia, right knee OA, right foot drop & lumbar radiculopathy previously seen by [facility] with multiple ESI's performed here for evaluation of right leg weakness.   ??  She feels as though her right knee gives out on her and sometimes hyperextends when she walks her dog. She is s/p genicular nerve block in April 2019 with [facility].  She has been seeing a chiropractor who recommended using an hinged knee brace, however patient feels as though it makes it difficult to walk. She also has a knee sleeve for her right knee which she prefers to use.  She reports that her right knee has been bone-on-bone since 1995.  She has occasionally uses Aspercreme with some relief.  ??  She also uses electromagnetic stimulation and an incline table with some relief.  ??  Most recent MRI of her spine was 2013 which revealed severe spinal stenosis & degenerative changes with focal right paracentral disk protrusion at L5-S1. She saw Dr. Ephriam Jenkins of [facility] who thought instrumented fusion??from L2-S1 would be the surgical treatment however her bone health (osteopenia) may make surgery a less preferred option.  ??  She has AFO but only wears it at night because it limits her mobility.  The foot drop reportedly started back in June 2013.    This visit does not involve [employer].    Past History:    The following portions of the patient's history were reviewed in the electronic medical record and updated as appropriate:  Allergies  Current medications  Past medical history  Problem list                 Review of Systems:   All systems reviewed and negative other than as noted in the HPI.     OBJECTIVE:     BP 127/77  - Pulse 84  - Temp 36.7 ??C (98.1 ??F)  - Ht 156.2 cm (5' 1.5)  - LMP  (LMP Unknown)  - BMI 22.86 kg/m??     Physical Exam:   GEN: No acute distress  HEENT: Atraumatic, normocephalic   CV: Extremities warm and well-perfused  RESP: Non-dyspneic on room air  GI: Abd non-distended  SKIN:No lesions appreciated.   PSYCH: Affect appropriate  MSK/NEURO:  05/30/20: Presents in [facility] today. Was uncomfortable in sitting so was transferred to low exam table and had to lay on side most of the visit. Exam limited by positioning and pain, has a hard time even sitting up now, so provacative exam limited. Does have TTP over sacrum and lumbar spine and paraspinals, including midline but no focal pain. MMT with worsening weakness in RLE, extent of which is difficult to note as it is also pain limited but, she remains 0/5 with EHL and at least 1/5 with ADF, 2/5 ankle inversion/eversion.   Previous:  MMT 5/5 in b/l UE with symmetric trace DTRs and negative hoffmans, with intact sensation to light touch. Does have crepitus in her shoulders and OA of the hands.   Previous: Right ADF still 2/5 - relies primarily on ankle everters rather than TA. EHL 0/5. APF and ankle inversion  4/5 today.   Previous: Motor   LUE 5/5 shoulder abductor, 5/5 elbow flexor, 5/5 elbow extender 5/5 wrist extensor, 5/5 wrist flexor, 5/5 finger abductor  RUE 5/5 shoulder abductor, 5/5 elbow flexor, 5/5 elbow extender 5/5 wrist extensor, 5/5 wrist flexor, 5/5 finger abductor  LLE 5/5 hip flexor, 5/5 knee extensor, 5/5 ankle dorsiflexor, 5/5 long toe extensor, 5/5 ankle plantar flexor  RLE 5/5 hip flexor, 5/5 knee extensor, 2/5 ankle dorsiflexor (ankle everts when she attempts to dorsiflex), 1/5 long toe extensor, 4/5 ankle plantar flexor  -- Lumbar flexion to 160' able to place palms on floor without knee flexion  -- No tenderness to palpation throughout midline lumbar spine or lumbar paraspinals  -- No pain with axial loading, or lateral rotation or lumbar extension  -- FABER & FADIR negative bilaterally  Gait and Station: significant hip hike on right to toe clearance of right foot   Reflexes  LLE: 1+ patella, 1+ Achilles  RLE: 1+ patella, 1+ Achilles  Hoffman's: - (negative) LUE, - (negative) RUE         Diagnostic Studies:       EXAM: Magnetic resonance imaging, brain without and with contrast material.  DATE: 01/29/2020 3:00 AM  ACCESSION: [id number]  DICTATED: 01/29/2020 3:16 AM  INTERPRETATION LOCATION: [facility]  ??  CLINICAL INDICATION: 79 years old Female with Concern for pituitary adenoma ; Transient ischemic attack (TIA)    ??  COMPARISON: CT head dated 01/28/2020.  ??  TECHNIQUE: Multiplanar, multisequence MR imaging of the brain was performed without and with I.V. contrast. Imaging of the pituitary gland including dynamic and thin section coronal and sagittal images was also performed.  ??  FINDINGS:  There is no enlargement of the pituitary gland or sella. There is no intrasellar or suprasellar mass. No abnormal enhancement. The pituitary stalk is midline. The optic chiasm is normal. 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  ??  Ventricles are normal in size. There is no midline shift. No extra-axial fluid collection. No evidence of intracranial hemorrhage. No diffusion weighted signal abnormality to suggest acute infarct. No cerebral or cerebellar mass.  ??  IMPRESSION:  -- No acute intracranial abnormality. No pituitary adenoma as clinically questioned.  ??  ====================  ADDENDUM (01/29/2020 9:48 AM):   Attending note:  -There is mild motion artifact during the dynamic scan, which could decrease the sensitivity of detecting small microadenoma. Overall, there is no evidence of macroadenoma.      MRI Cervical Spine (07/26/18): Per Dr. 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 ??No immediate patient complications or events noted.    FINDINGS:  Alignment: Straightening of the normal cervical lordosis. Grade 1  anterolisthesis C4 on C5, C5 on C6, C7 on T1, T2 on T3.   Spinal Cord: The visualized cord is normal in caliber and signal.   Marrow Signal: Normal.  Epidural Hematoma: None.  Vertebral Body Heights: Symmetric height loss most pronounced C6 with  additional, less severe findings at C5 and C7.  Intervertebral Discs: Multilevel disc space height loss most pronounced  C6-T1  and C3-C4.    Paraspinal Soft Tissues: Unremarkable.  Neck Soft Tissues: Unremarkable.    C1-C2: Degenerative changes about the atlantodental interval with  hypertrophy of the transverse craniocervical ligament.  C2-C3: No significant neuroforaminal narrowing. Minimal disc protrusion. No  spinal canal stenosis.  C3-C4: Facet arthropathy resulting in moderate to severe bilateral  neuroforaminal narrowing. Circumferential disc bulge and buckling of the  ligamentum flavum which results in no significant spinal canal stenosis.  C4-C5: Facet arthropathy results in moderate to severe bilateral  neuroforaminal narrowing. Circumferential disc bulge with buckling of the  ligamentum flavum results in mass effect on the ventral spinal cord and no  significant spinal canal stenosis.  C5-C6: Facet arthropathy resulting in moderate to severe bilateral neural  foraminal narrowing. Circumferential disc bulge and buckling of the  ligamentum flavum results in indentation of the ventral spinal cord and  mild spinal canal stenosis.  C6-C7: Facet arthropathy resulting in moderate to severe bilateral  neuroforaminal narrowing. Circumferential disc bulge which which abuts and  indents the ventral spinal cord without significant spinal canal stenosis.  C7-T1: Facet arthropathy resulting in moderate bilateral neuroforaminal  narrowing. Circumferential disc bulge and buckling of the ligamentum flavum  results in mild spinal canal stenosis.  T1-T2: Facet arthropathy resulting in moderate neural foraminal narrowing.  Small disc protrusion. No spinal canal stenosis.  T2-T3: Mild narrowing of the right neural foramen. No spinal canal  stenosis.  T3-T4: No neuroforaminal narrowing. Mild disc bulge. No spinal canal  stenosis.    IMPRESSION:  1. 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  2. ??Moderate to severe bilateral neuroforaminal narrowing within the  cervical spine.    The preliminary report (critical or emergent communication) was reviewed  prior to this dictation and there are no substantial differences between  the preliminary results and the impressions in this final report.      XR Entire Spine Thoracic and Lumbar  07/21/18:   Radiographs of the entire thoracic and lumbar spine - 2 views, 6 images.     Indication: M54.9 Dorsalgia, unspecified.     Comparison: Lumbar spine MRI 04/28/2018.    Findings:   Frontal and lateral images of the thoracolumbar spine were acquired; upper  and lower images were appropriately fused.    Subcentimeter anterolisthesis at the cervicothoracic junction. Diffuse  multilevel degenerative disc disease throughout the spine, greatest in the  lower cervical and lumbar spine. Lumbar dextrocurvature, thoracolumbar  junction levocurvature, and midthoracic dextrocurvature. Mild bilateral hip  joint space narrowing. Moderate to severe left glenohumeral joint space  loss and marginal osteophytosis. Straightening of the thoracolumbar  lordosis.    Clear lungs. Aortobiiliac atherosclerotic calcifications. Moderate  functional without evidence for obstruction. Pelvic phleboliths.    Impression:   Scoliosis and spondylosis.    Please note that this examination is designed to assess spinal alignment;  if detailed evaluation of the cervical, thoracic, and/or lumbar spine is  desired, consider dedicated cervical spine, thoracic spine, and/or lumbar  spine radiographic studies.    EMG 05/27/18:    CONCLUSION   Abnormal study. There is electrodiagnostic evidence of chronic right L5   radiculopathy. These findings have worsened since prior study performed in   2013. Clinical correlation advised.     IMAGING:  MRI lumbar spine 04/28/2018    For the purposes of this dictation, the lowest well formed intervertebral disc space is assumed to be the L5-S1 level, and there are presumed to be five lumbar-type vertebral bodies.  Mild straightening of normal lumbar lordosis with dextrocurvature of the lower lumbar spine, similar in appearance when compared to prior. Grade 1 left lateral listhesis of L1 on L2 and grade 1 right lateral listhesis of L3 on L4. Minimal stepwise grade 1 anterolisthesis of T12 on L1 and L1 on L2.    Multilevel degenerative changes of the spine as evidenced by anterior endplate osteophytosis, endplate signal abnormalities, and intervertebral disc space narrowing.    No abnormal signal abnormality within the spinal cord. The conus medullaris ends at a normal level.    At T12-L1, there is asymmetric disc bulging, ligamentum flavum hypertrophy and facet arthropathy resulting in mild right-sided neural foraminal and spinal canal narrowing. The left neural foramen is patent.    At L1-L2, there is diffuse disc bulging and facet arthropathy, resulting in moderate central canal narrowing. There is mild right neural foraminal stenosis    At L2-L3, there is diffuse disc bulging, ligamentum flavum hypertrophy, and facet arthropathy results in severe central canal narrowing and moderate neural foraminal narrowing on the left. The right neural foramen is patent.    At L3-L4, there is diffuse disc bulging, ligamentum flavum hypertrophy, and facet arthropathy, resulting in severe central canal narrowing and moderate to severe left neural foraminal narrowing.    At L4-L5, there is asymmetrical disc bulging involving primarily the subarticular zone on the right, resulting in severe right and mild left neural foraminal narrowing. Hypertrophic ligamentum flavum with facet arthropathy, with associated severe spinal canal narrowing.    At L5-S1, there is diffuse disc bulging, hypertrophic ligamentum flavum, and facet arthropathy resulting in moderate spinal canal narrowing and mild-to-moderate neural foraminal narrowing bilaterally.      Asa Saunas, MD  Assistant Professor - PM&R  Interventional Spine Specialist - [facility]  Staff Physiatrist [facility] for Rehabilitation Care   [place] - [facility]

## 2020-06-01 DIAGNOSIS — M792 Neuralgia and neuritis, unspecified: Principal | ICD-10-CM

## 2020-06-01 DIAGNOSIS — R1310 Dysphagia, unspecified: Principal | ICD-10-CM

## 2020-06-01 DIAGNOSIS — E639 Nutritional deficiency, unspecified: Principal | ICD-10-CM

## 2020-06-01 DIAGNOSIS — M4802 Spinal stenosis, cervical region: Principal | ICD-10-CM

## 2020-06-01 DIAGNOSIS — M4120 Other idiopathic scoliosis, site unspecified: Principal | ICD-10-CM

## 2020-06-01 DIAGNOSIS — R269 Unspecified abnormalities of gait and mobility: Principal | ICD-10-CM

## 2020-06-05 NOTE — Unmapped (Signed)
Cindy [name], daughter of patient called requesting prescription for Gabapentin be sent to the pharmacy on file.

## 2020-06-06 ENCOUNTER — Ambulatory Visit: Admit: 2020-06-06 | Discharge: 2020-06-07 | Payer: MEDICARE

## 2020-06-06 NOTE — Unmapped (Signed)
[facility] Administered Medication Refill Coordination Note      NAME:Cindy [name] DOB: Jul 02, 1941      Medication: invega sustenna  Day Supply: 28 days      SHIPPING      Next delivery from [facility] [phone]) to [facility] in [place] for Cindy [name] is scheduled for 10/28.    Clinic contact: Conan Bowens    Patient's next nurse visit for administration: 10/29.    We will follow up with clinic monthly for standard refill processing and delivery.      Kyliegh Jester Samella Parr  Specialty Pharmacy Technician

## 2020-06-07 DIAGNOSIS — R1310 Dysphagia, unspecified: Principal | ICD-10-CM

## 2020-06-07 DIAGNOSIS — M792 Neuralgia and neuritis, unspecified: Principal | ICD-10-CM

## 2020-06-07 DIAGNOSIS — R269 Unspecified abnormalities of gait and mobility: Principal | ICD-10-CM

## 2020-06-07 DIAGNOSIS — E639 Nutritional deficiency, unspecified: Principal | ICD-10-CM

## 2020-06-07 DIAGNOSIS — M4802 Spinal stenosis, cervical region: Principal | ICD-10-CM

## 2020-06-07 DIAGNOSIS — M4120 Other idiopathic scoliosis, site unspecified: Principal | ICD-10-CM

## 2020-06-07 MED ORDER — GABAPENTIN 100 MG CAPSULE
ORAL_CAPSULE | Freq: Every evening | ORAL | 0 refills | 90.00000 days | Status: CP
Start: 2020-06-07 — End: 2020-07-07

## 2020-06-07 NOTE — Unmapped (Signed)
Reviewed. Action taken in a different encounter.

## 2020-06-08 ENCOUNTER — Ambulatory Visit: Admit: 2020-06-08 | Discharge: 2020-06-09 | Payer: MEDICARE

## 2020-06-08 DIAGNOSIS — R1031 Right lower quadrant pain: Principal | ICD-10-CM

## 2020-06-08 LAB — CBC
HEMATOCRIT: 37.8 % (ref 36.0–46.0)
HEMOGLOBIN: 12.6 g/dL (ref 12.0–16.0)
MEAN CORPUSCULAR HEMOGLOBIN CONC: 33.4 g/dL (ref 31.0–37.0)
MEAN CORPUSCULAR VOLUME: 99.7 fL (ref 80.0–100.0)
MEAN PLATELET VOLUME: 7.3 fL (ref 7.0–10.0)
PLATELET COUNT: 250 10*9/L (ref 150–440)
RED CELL DISTRIBUTION WIDTH: 15.1 % — ABNORMAL HIGH (ref 12.0–15.0)
WBC ADJUSTED: 6.7 10*9/L (ref 4.5–11.0)

## 2020-06-08 LAB — RED CELL DISTRIBUTION WIDTH: Lab: 15.1 — ABNORMAL HIGH

## 2020-06-08 NOTE — Unmapped (Signed)
[name],  I would like to see if we can get a hospital bed for Cindy [name]. I've put the order in for [facility] and I will update my last progress note for documentation. Can you help coordinate?  Thanks  Morrie Sheldon

## 2020-06-08 NOTE — Unmapped (Signed)
Addended by: Orland Mustard on: 06/08/2020 11:53 AM     Modules accepted: Orders

## 2020-06-09 ENCOUNTER — Ambulatory Visit: Admit: 2020-06-09 | Discharge: 2020-06-10 | Payer: MEDICARE

## 2020-06-09 NOTE — Unmapped (Signed)
HI  There is a fax in your box from [facility]  requiring your signature.  Thank you

## 2020-06-09 NOTE — Unmapped (Signed)
Patient Advice Request - Route to Ancillary Staff [facility] Name and relation (if other than patient):   Question/Concern for provider (Specific): Cindy [name] is moving to the [facility] facility and some forms will be faxed that need to be signed by Dr. Lennox Pippins if any questions please call Erie Noe.   Best Contact Time: Anytime   Other info: n/a

## 2020-06-09 NOTE — Unmapped (Signed)
Fax sent to [employer] at [phone]

## 2020-06-13 DIAGNOSIS — R1310 Dysphagia, unspecified: Principal | ICD-10-CM

## 2020-06-13 DIAGNOSIS — M4802 Spinal stenosis, cervical region: Principal | ICD-10-CM

## 2020-06-13 DIAGNOSIS — M792 Neuralgia and neuritis, unspecified: Principal | ICD-10-CM

## 2020-06-13 DIAGNOSIS — E639 Nutritional deficiency, unspecified: Principal | ICD-10-CM

## 2020-06-13 DIAGNOSIS — M4120 Other idiopathic scoliosis, site unspecified: Principal | ICD-10-CM

## 2020-06-13 DIAGNOSIS — R269 Unspecified abnormalities of gait and mobility: Principal | ICD-10-CM

## 2020-06-14 ENCOUNTER — Ambulatory Visit: Admit: 2020-06-14 | Discharge: 2020-06-15 | Payer: MEDICARE

## 2020-06-14 DIAGNOSIS — R1310 Dysphagia, unspecified: Principal | ICD-10-CM

## 2020-06-14 DIAGNOSIS — M4120 Other idiopathic scoliosis, site unspecified: Principal | ICD-10-CM

## 2020-06-14 DIAGNOSIS — R269 Unspecified abnormalities of gait and mobility: Principal | ICD-10-CM

## 2020-06-14 DIAGNOSIS — M4802 Spinal stenosis, cervical region: Principal | ICD-10-CM

## 2020-06-14 DIAGNOSIS — E639 Nutritional deficiency, unspecified: Principal | ICD-10-CM

## 2020-06-14 DIAGNOSIS — M792 Neuralgia and neuritis, unspecified: Principal | ICD-10-CM

## 2020-06-14 MED ORDER — SERTRALINE 50 MG TABLET
ORAL_TABLET | Freq: Every day | ORAL | 0 refills | 90 days
Start: 2020-06-14 — End: ?

## 2020-06-14 MED ORDER — CHOLECALCIFEROL (VITAMIN D3) 50 MCG (2,000 UNIT) TABLET
Freq: Every day | ORAL | 0 refills | 0 days
Start: 2020-06-14 — End: ?

## 2020-06-14 MED ORDER — DOCUSATE SODIUM 100 MG CAPSULE
Freq: Every day | ORAL | 0 refills | 0 days
Start: 2020-06-14 — End: ?

## 2020-06-14 MED ORDER — ASPERCREME (LIDOCAINE HCL) 4 % TOPICAL LIQUID ROLL-ON
Freq: Three times a day (TID) | TOPICAL | 0 refills | 0 days | PRN
Start: 2020-06-14 — End: ?

## 2020-06-14 MED ORDER — ATROPINE 1 % EYE DROPS
0 refills | 0 days | Status: CP
Start: 2020-06-14 — End: ?

## 2020-06-14 NOTE — Unmapped (Signed)
Patient Advice Request - Route to Ancillary Staff [facility] Name and relation (if other than patient): Erie Noe - RN [facility]  Question/Concern for provider (Specific): Patient is scheduled to move into [facility] Monday 06/19/2020 and they are waiting for FL2 to be signed TB Flu and pneumonia Consent as well to be signed .. along with diet orders and standing order  Best Contact Time: any, [phone]  Other info: n/a

## 2020-06-14 NOTE — Unmapped (Signed)
Jan,  I have these forms mostly completed, but need patient input on one of them so will bring to you to fax after her visit this afternoon.  Morrie Sheldon

## 2020-06-14 NOTE — Unmapped (Signed)
fol

## 2020-06-14 NOTE — Unmapped (Signed)
Reviewed. Action taken in a different encounter.

## 2020-06-14 NOTE — Unmapped (Signed)
Hi  I have faxed this to [phone]

## 2020-06-14 NOTE — Unmapped (Signed)
ASSESSMENT/PLAN:    Problem List Items Addressed This Visit        Digestive    Irritable bowel syndrome with both constipation and diarrhea     Symptoms have been more out of control since last hospitalization. She has been needing treatment for constipation this past week. I do recommend getting follow up with her GI doctor as when last seen her symptoms were very stable.             Other    Physical deconditioning     She continues to struggle with weakness, decresed mobility and function. Moving into assisted living next week. Paperwork completed today and faxed to facility.         Spinal stenosis of lumbar region - Primary     She has severe spinal stenosis. Plan to see neurosurgeon again, on review I don't see that referral was placed. Will place today.          Relevant Orders    Ambulatory referral to Spine Center           HEALTH MAINTENANCE ITEMS STILL DUE:  There are no preventive care reminders to display for this patient.    Follow-up: Return in about 4 weeks (around 07/12/2020) for follows up chronic issues. .    Future Appointments   Date Time Provider Department Center   06/26/2020  1:30 PM [place] [facility]SE [place] [facility]   07/04/2020 10:15 AM Claris Pong, MD [facility]   07/17/2020  9:45 AM Delorse Lek, MD [facility]   07/27/2020  2:00 PM [name], PMHNP [facility]   08/04/2020  9:50 AM Jimmie Molly, MD [facility]   09/27/2020  8:30 AM Kimberlee Nearing, MD [facility]     I personally spent 33 minutes face-to-face and non-face-to-face in the care of this patient, which includes all pre, intra, and post visit time on the date of service.      Chief Complaint   Patient presents with   ??? Follow-up       SUBJECTIVE:    Cindy [name] is a 79 y.o. female that presents to clinic today regarding the following issues:  -had a lot of diarrhea early yesterday morning. Had no bowel medicine after that. Today did stool softener.   -coccyx seems to be off to the side. Her daughter used to be able to do muscle work in this area, but she hasn't done it in awhile and she didn't do it in geriatric population so she has tried to be very gentle   -her daughter hasn't pushed her as much on days that she is feeling more in pain, weak. On bad days she has tried to give her some rest, but it is a push and pull to keep her function up  -she is moving into assisted living next week    Review of Systems as above      Cindy [name]  reports that she quit smoking about 26 years ago. She quit after 30.00 years of use. She has never used smokeless tobacco.    OBJECTIVE:    Cindy [name]  weight is 54.8 kg (120 lb 14.4 oz). Her temporal temperature is 36.3 ??C (97.3 ??F). Her blood pressure is 105/69 and her pulse is 100.   Wt Readings from Last 3 Encounters:   06/16/20 54.4 kg (120 lb)   06/14/20 54.8 kg (120 lb 14.4 oz)  05/29/20 55.8 kg (123 lb)      PHQ-9 PHQ-9 TOTAL SCORE   06/16/2020 13   05/29/2020 21   03/29/2020 9   03/16/2020 11   02/26/2020 8   02/20/2020 5   02/13/2020 13     PHQ-2 Score:      PHQ-9 Score:      Inocente Salles Score:      GAD-7 Score:     Physical Exam  Constitutional:       General: She is not in acute distress.     Appearance: She is not ill-appearing.   Neurological:      Mental Status: She is alert.   Psychiatric:      Comments: Depressed affect

## 2020-06-14 NOTE — Unmapped (Signed)
Sorry  The fax number it was faxed to is [phone].(number on form) Thanks

## 2020-06-15 MED FILL — INVEGA SUSTENNA 156 MG/ML INTRAMUSCULAR SYRINGE: 28 days supply | Qty: 1 | Fill #2 | Status: AC

## 2020-06-15 MED FILL — INVEGA SUSTENNA 156 MG/ML INTRAMUSCULAR SYRINGE: INTRAMUSCULAR | 28 days supply | Qty: 1 | Fill #2

## 2020-06-16 ENCOUNTER — Ambulatory Visit
Admit: 2020-06-16 | Discharge: 2020-06-17 | Payer: MEDICARE | Attending: Psychiatric/Mental Health | Primary: Psychiatric/Mental Health

## 2020-06-16 DIAGNOSIS — E639 Nutritional deficiency, unspecified: Principal | ICD-10-CM

## 2020-06-16 DIAGNOSIS — M4802 Spinal stenosis, cervical region: Principal | ICD-10-CM

## 2020-06-16 DIAGNOSIS — R1310 Dysphagia, unspecified: Principal | ICD-10-CM

## 2020-06-16 DIAGNOSIS — R269 Unspecified abnormalities of gait and mobility: Principal | ICD-10-CM

## 2020-06-16 DIAGNOSIS — M4120 Other idiopathic scoliosis, site unspecified: Principal | ICD-10-CM

## 2020-06-16 DIAGNOSIS — F259 Schizoaffective disorder, unspecified: Principal | ICD-10-CM

## 2020-06-16 DIAGNOSIS — F4322 Adjustment disorder with anxiety: Principal | ICD-10-CM

## 2020-06-16 DIAGNOSIS — M792 Neuralgia and neuritis, unspecified: Principal | ICD-10-CM

## 2020-06-16 NOTE — Unmapped (Signed)
Hi  Please see message.  I still have the form you had me to fax. Do you want this placed in your box?

## 2020-06-16 NOTE — Unmapped (Signed)
Cindy [name] from [facility] stated she has not received the documentation needed and patient is admitted today and needs paperwork to please refax [phone]

## 2020-06-16 NOTE — Unmapped (Signed)
Referral request

## 2020-06-16 NOTE — Unmapped (Signed)
Form refaxed to [phone]

## 2020-06-16 NOTE — Unmapped (Signed)
Cindy [name] called back explained documents are received and not valid because forms are not signed by a physician or np, medications needs to be added to [facility]

## 2020-06-16 NOTE — Unmapped (Signed)
[facility]  Psychiatry   Established Patient E&M Service - Outpatient       Assessment:    Cindy [name] presents for follow-up evaluation.     Identifying Information:  Cindy [name] is a 79 y.o. female with a history of PPD, schizoaffective disorder and hyperthyroidism who was admitted involuntarily to the [facility] Geropsychiatry unit 6/18- 03/01/2020, who presents for evaluation of mood, medication and to establish care. . Endorsing depression related to recent decline of physical abilities. This is Cindy [name] first outing in a wheel chair. Today, Cindy [name] is endorsing anhedonia, low motivation and mood, and sialorrhea that started after receiving [place] injection.    Risk Assessment:  A full psychiatric risk assessment was conducted on 03/16/2020 and risks do not appear significantly changed from that visit.   While future psychiatric events cannot be accurately predicted, the patient does not currently require acute inpatient psychiatric care and does not currently meet [facility] involuntary commitment criteria.      Plan:    Problem: Schizoaffective disorder  Status of problem: chronic and stable  Interventions: Continue Invega Sustenna  156 mg q 28 days. Last injection   Discussed possible risk of side effects of Invega including hyperglycemia, NMS, seizures, EPS, hyperprolactinemia, diabetes, dyslipidemia, tardive dyskinesia, sedation, hyper salivation, orthostatic hypotension, tachycardia, injection site reactions.     Problem: Adjustment disorder  Status of problem:  new problem to this provider  Interventions: Recently made appt with new therapist. Pt recently experienced significant change in mobility and independence. Also connected to PT that is helping with independence.       Problem: Sialorrhea  Status of problem: improved or improving  Interventions: continue ipratropium bromide SL 1-2 sprays up to 4 x per day  Discussed possible side effects of ipratropium bromide including hypersensitivity reactions, bronchospasm, bladder neck obstruction, intraocular pressure, prostatic hyperplasia, urinary retention, abnormal taste in mouth, MI, dry nasal mucosa, sinusitis, CVA    Problem: Depression  Status of problem:  not improving as expected  Interventions: - continue sertraline 50 mg daily Discussed possible risk of side effects of sertraline including seizure, induction of mania, activation of SI, sexual dysfunction, GI, sedation, HA dizziness, hyponatremia, serotonin syndrome.      Problem: r/o Mild neurocognitive impairment  Status of problem:  new problem to this provider  Interventions: MOCA performed 02/11/20 score:??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 Will repeat at future follow up to reevaluate.     Problem: Insomina  Status of problem:  chronic and stable  Interventions: - continue melatonin 3 mg OTC  -may also take OTC benadryl prn. Aware of risk of anticholinergic side effects.     Problem: Hx of medication side effects  Status of problem:  new problem to this provider  Interventions:Genesight test 03/16/2020     Risks/benefits and indications for treatment with medications above were discussed with the patient. The patient asked appropriate questions, acknowledged understanding of answers, and provided informed consent to initiation&??continuation of medications above.??  ??  Return to clinic appointment: in 3-4 weeks or earlier as needed by patient         Psychotherapy provided:  No billable psychotherapy service provided but brief supportive therapy was utilized.    Patient has been given this writer's contact information as well as the [facility] Psychiatry urgent line number. The patient has been instructed to call 911 for emergencies.      Subjective:    Interval History:   Sleep has improved with gabapentin.  Volterin has helped with pain more then lidocaine patches. It's hard for me to get comfortable. Knee and ankle braces are causing sores on her skin. Moving into an assisted living on Monday and hopeful she will feel safer. Has an appointment for a steroid injection to relieve some of the joint inflammation. States she has been sitting with a lot of things that happened in her past and feeling guilt and regret. Was able to start reading again and feels like addition of sertraline has benefited overall mood.     Past medication history: Fluoxetine (not effective), tegretol (anaphylaxis), lithium (dystonia), paliperidone (hypersalivation)       Objective:    Mental Status Exam:  Appearance:    Appears stated age and Clean/Neat   Motor:   No abnormal movements   Speech/Language:    Normal rate, volume, tone, fluency   Mood:   Depressed   Affect:   Euthymic   Thought process and Associations:   Logical, linear, clear, coherent, goal directed   Abnormal/psychotic thought content:     Denies SI, HI, self harm, delusions, obsessions, paranoid ideation, or ideas of reference   Perceptual disturbances:     Denies auditory and visual hallucinations, behavior not concerning for response to internal stimuli     Other:            Visit was completed face to face.      [name], PMHNP

## 2020-06-16 NOTE — Unmapped (Signed)
Follow-up instructions:  -- Please continue taking your medications as prescribed for your mental health.   -- Do not make changes to your medications, including taking more or less than prescribed, unless under the supervision of your physician. Be aware that some medications may make you feel worse if abruptly stopped  -- Please refrain from using illicit substances, as these can affect your mood and could cause anxiety or other concerning symptoms.   -- Seek further medical care for any increase in symptoms or new symptoms such as thoughts of wanting to hurt yourself or hurt others.     Contact info:  Life-threatening emergencies: call 911 or go to the nearest ER for medical or psychiatric attention.     Issues that need urgent attention but are not life threatening: call the clinic at [phone].    Non-urgent routine concerns, questions, and refill requests: please call the clinic for assistance.     Regarding appointments:  - If you need to cancel your appointment, we ask that you call [phone] at least 24 hours before your scheduled appointment.  - If for any reason you arrive 15 minutes later than your scheduled appointment time, you may not be seen and your visit may be rescheduled.  - Please remember that we will not automatically reschedule missed appointments.  - If you miss two (2) appointments without letting us know at least 24 hours in advance, you will likely be referred to a provider in your community.  - We will do our best to be on time. Sometimes an emergency will arise that might cause your clinician to be late. We will try to inform you of this when you check in for your appointment. If you wait more than 15 minutes past your appointment time without such notice, please speak with the front desk staff.    In the event of bad weather, the clinic staff will attempt to contact you, should your appointment need to be rescheduled. Additionally, you can call the Patient Weather Line [phone] for system-wide clinic status    For more information and reminders regarding clinic policies (these were provided when you were admitted to the clinic), please ask the front desk.    Maija Biggers, MPH, PMHNP-BC  Department of Psychiatry  [facility]  [address]  [address]  [place], [place] [address]  Phone: [phone]

## 2020-06-19 NOTE — Unmapped (Signed)
Patient Advice Request - Route to Ancillary Staff [facility] Name and relation (if other than patient): [facility]  Question/Concern for provider (Specific): Medication list not signed on FL2, they stated it needs to be signed at the bottom. Also stated any pages with medications need to be signed  Best Contact Time: any  Other info: Patient is moving in today by noon and they would like for it to be signed by then and faxed back to [phone]

## 2020-06-19 NOTE — Unmapped (Signed)
FAX SENT TO [facility] AT [phone]

## 2020-06-20 NOTE — Unmapped (Signed)
I called pt. to remind her of the appt. on 06/22/20 at 1430. I asked pt. to arrive at 1400. Pt. reminded to bring a driver and that it is OK to eat before the appt. Pt. advised that if there are s/s of illness or if on antibiotics for any reason, the appt. will need to be rescheduled. Denies illness and not taking antibiotics. She will stop taking fish oil today.

## 2020-06-22 ENCOUNTER — Ambulatory Visit: Admit: 2020-06-22 | Discharge: 2020-06-23 | Payer: MEDICARE

## 2020-06-22 ENCOUNTER — Ambulatory Visit
Admit: 2020-06-22 | Discharge: 2020-06-23 | Payer: MEDICARE | Attending: Physical Medicine & Rehabilitation | Primary: Physical Medicine & Rehabilitation

## 2020-06-22 DIAGNOSIS — M5136 Other intervertebral disc degeneration, lumbar region: Principal | ICD-10-CM

## 2020-06-22 DIAGNOSIS — R29898 Other symptoms and signs involving the musculoskeletal system: Principal | ICD-10-CM

## 2020-06-22 DIAGNOSIS — R269 Unspecified abnormalities of gait and mobility: Principal | ICD-10-CM

## 2020-06-22 DIAGNOSIS — M21371 Foot drop, right foot: Principal | ICD-10-CM

## 2020-06-22 DIAGNOSIS — M48061 Spinal stenosis, lumbar region without neurogenic claudication: Principal | ICD-10-CM

## 2020-06-22 MED ADMIN — sodium chloride (NS) 0.9 % flush: @ 19:00:00 | Stop: 2020-06-22

## 2020-06-22 MED ADMIN — dexamethasone (DECADRON) injection: @ 19:00:00 | Stop: 2020-06-22

## 2020-06-22 MED ADMIN — lidocaine (XYLOCAINE) 10 mg/mL (1 %) injection: @ 19:00:00 | Stop: 2020-06-22

## 2020-06-22 MED ADMIN — iohexoL (OMNIPAQUE) 180 mg iodine/mL solution 1 mL: 1 mL | @ 19:00:00 | Stop: 2020-06-22

## 2020-06-22 NOTE — Unmapped (Addendum)
[facility] Physical Medicine and Rehabilitation     Patient Name:Cindy [name]  MRN: [id number]  DOB: [birth year]  Age: 79 y.o.     06/22/2020     PROCEDURE: Caudal Epidural Steroid Injection    INDICATION:     ICD-10-CM   1. Spinal stenosis of lumbar region, unspecified whether neurogenic claudication present  M48.061   2. Abnormality of gait  R26.9   3. DDD (degenerative disc disease), lumbar  M51.36   4. 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 Weakness of both lower extremities  R29.898     Discussed procedure and what makes this different than her past ESI. Discussed ongoing severe stenosis and my desire to have her see a spine surgeon. They were unable to get this set-up, told me they needed an updated referral, which I provided, though she had been seen in past at both [facility] and [facility] centers so I am not sure why a new referral was needed.   Given further functional decline she was moved into assistive living as of this week. Her daughter notes its been a hard start as she has not gotten any meds for 2-3 days, is not allowed to wear her knee and ankles braces without an order, etc.     TECHNIQUE: After confirming written and informed consent discussing risks of bleeding, infection, nerve injury, medication reaction, and alternative of not doing the procedure, the patient was brought to the procedure room and placed in the prone position. BP, HR, O2SAT, visual/verbal monitoring was established. A TIME OUT was performed. Using universal sterile precautions, procedure equipment and medications were prepared in sterile fashion and skin was prepped with chlorhexidine swab X 1 then draped. Fluoroscopy was used to optimize visualization of the bony landmarks and thereafter used to guide needle placement. The sacral hiatus was identified in AP view. The fluoroscope was then positioned to provide a lateral view of the sacral hiatus. Skin was anesthetized using 2ml of 1% lidocaine. A 22 gauge 3.5 inch needle was directed toward the target using intermittent fluoroscopic guidance. After the needle passed through the sacrococcygeal ligament, the needle angle was lowered and the needle was advanced 1 cm. Final position was confirmed with multiplanar AP and lateral views and was appropriately below the S3 level. There were no paresthesias throughout needle placement. After negative aspiration for blood and cerebrospinal fluid, 1-2 ml of nonionic contrast agent was slowly injected with appropriate epidural dye pattern and no vascular uptake . Spread of contrast agent within the caudal epidural space was confirmed with fluoroscopic imaging in the AP and lateral views. Subsequently, the epidural space was injected slowly and without resistance with 4mL of preservative-free normal saline, 5mL of 1% Lidocaine and 10 mg of dexamethasone for a total injectate volume of 10 ml. There was no pain or paresthesias on injection. Vital signs remained stable throughout the procedure. The needle(s) were removed intact and a bandage was applied. The patient was transferred to the recovery area. The patient was monitored, reassessed and discharged after an appropriate observatory period.    COMPLICATIONS: The patient tolerated the procedure well and there were no complications.    PRE-PROCEDURE PAIN SCORE: 6  POST-PROCEDURE PAIN SCORE: 3    POST-PROCEDURE EXAM: No new neurologic compromise; patient discharged with baseline neurologic function.    Assistant: Dr Gretta Cool   I was present for the entire procedure.     PLAN:   ?? Discussed goal to have her follow-up with her previous surgical team  given her worsening leg weakness. Updated referral sent since they noted they were not able to schedule without this.   ?? She is currently in assisted living as of this week, off to a slow start as she requires orders for any therapy, meds, bracing, etc.   ?? Provided updated note to allow knee braces and AFO.   ?? Referred her back to her PCP for further med orders, room assessment for bedside commode, etc.   ?? She can follow-up with me as needed if not a surgical candidate.     Asa Saunas, MD  Assistant Professor - PM&R  Interventional Spine Specialist - [facility]  Staff Physiatrist [facility] for Rehabilitation Care   [place] [facility] [place] - [facility]

## 2020-06-22 NOTE — Unmapped (Signed)
HI  There is a fax in your box from  [facility] requiring your signature.  Thank you

## 2020-06-22 NOTE — Unmapped (Signed)
[name] (Daughter) [phone]  Called again to request this message be marked High Priority

## 2020-06-23 NOTE — Unmapped (Signed)
She continues to struggle with weakness, decresed mobility and function. Moving into assisted living next week. Paperwork completed today and faxed to facility.

## 2020-06-23 NOTE — Unmapped (Signed)
She has severe spinal stenosis. Plan to see neurosurgeon again, on review I don't see that referral was placed. Will place today.

## 2020-06-23 NOTE — Unmapped (Signed)
Symptoms have been more out of control since last hospitalization. She has been needing treatment for constipation this past week. I do recommend getting follow up with her GI doctor as when last seen her symptoms were very stable.

## 2020-06-23 NOTE — Unmapped (Signed)
HI  There is a fax in your box from [facility]  requiring your signature.  Thank you

## 2020-06-26 ENCOUNTER — Institutional Professional Consult (permissible substitution): Admit: 2020-06-26 | Discharge: 2020-06-27 | Payer: MEDICARE

## 2020-06-26 MED ADMIN — paliperidone palmitate (INVEGA SUSTENNA) 156 mg/mL injection 156 mg: 156 mg | INTRAMUSCULAR | @ 19:00:00

## 2020-06-26 NOTE — Unmapped (Signed)
Administered long-acting injectable today, 06/26/2020. See MAR for additional documentation. Loleta Dicker

## 2020-06-27 NOTE — Unmapped (Signed)
Left message for patient daughter to follow up on if patient received gait belt.         Ellyse Rotolo Broadus John, MSW  [employer] [facility]  [phone]

## 2020-06-30 DIAGNOSIS — G2581 Restless legs syndrome: Principal | ICD-10-CM

## 2020-06-30 DIAGNOSIS — M4802 Spinal stenosis, cervical region: Principal | ICD-10-CM

## 2020-06-30 MED ORDER — GABAPENTIN 100 MG CAPSULE
ORAL_CAPSULE | 1 refills | 0 days | Status: CP
Start: 2020-06-30 — End: ?

## 2020-07-03 NOTE — Unmapped (Signed)
I faxed this via [employer]. Will bring to scan into media.

## 2020-07-03 NOTE — Unmapped (Signed)
Jan,  Can you reach out to patient's daughter to see if they can get an appointment with someone this week to review her medications. I think should be a 40 minute appointment. Can be in person or virtual. I am working on inpatient this week so please chat me if you need any clarification.    Will need details of her medications including names, dosage, directions for how she takes it and if is an as needed medication what she takes it for and when.     She has many over the counter medications and supplements that I do not have details needed to provide to her assisted living facility.     I sent a fax tonight and will put some pictures of what I sent in this message, but likely will need further clarification on several of the things I sent.

## 2020-07-03 NOTE — Unmapped (Signed)
Reviewed message. No action needed at this time.

## 2020-07-04 ENCOUNTER — Ambulatory Visit: Admit: 2020-07-04 | Discharge: 2020-07-05 | Payer: MEDICARE

## 2020-07-04 DIAGNOSIS — H02831 Dermatochalasis of right upper eyelid: Principal | ICD-10-CM

## 2020-07-04 DIAGNOSIS — H02139 Senile ectropion of unspecified eye, unspecified eyelid: Principal | ICD-10-CM

## 2020-07-04 DIAGNOSIS — H02834 Dermatochalasis of left upper eyelid: Principal | ICD-10-CM

## 2020-07-04 NOTE — Unmapped (Signed)
Assessment & Plan:  Cindy [name] is a 79 y.o. female with the following diagnoses:   1. Dermatochalasis of both upper eyelids    2. Senile ectropion, unspecified laterality       FUNCTIONAL COMPLAINTS RELATED TO DROOPY EYELIDS/BROWS:  Cindy [name] describes upper lids and/or brow interfering with superior visual field and interfering with activities of daily living including reading, driving and watching television.     Photos 07/04/20:  Dermatochalasis, lower lid ectropion                      EXAM:       MRD1: Right eye 0.5;   Left eye 1  Dermatochalasis with excess skin touching eyelashes       VISUAL FIELD:  Right eye untaped:15 degrees Right eye taped:40 degrees  Left eye untaped:15 degrees Left eye taped:40 degrees    Right eye visual field improves by: 25 degrees  Left eye visual field improves by: 25 degrees    Levator function: Right eye 15; Left eye 15  Phenylephrine response? n/a  Hering drop? n/a    PLAN:  Bilateral upper blepharoplasty (Skin+sup orbic)  Bilateral lower blepharoplasty (LTS)    We discussed blepharoplasty alone vs bleph+ptosis repair with her daughter as well and we agree that blepharoplasty alone would put her at less risk for issues with exposure after surgery that could prohibit or delay her cataract surgery.  Lower eyelid laxity with ectropion is significant and she would benefit from repair via LTS prior to her cataract surgery as well.  She is not on blood thinners and has no cardiac devices and not on oxygen at baseline at home.      Today with Cindy [name]  and her daughter, I reviewed the indications, risks, benefits, and alternatives of the proposed surgical procedure including, but not limited to, failure obtain the desired result  and need for additional surgery, bleeding, infection, loss of vision, loss of the eye, and the remote possibility of permanent damage to any organ system or death with the use of anesthesia.  I provided multiple opportunities for the questions, answered all questions to the best of my ability, and confirmed that my answers and my discussion were understood.  The patient elects to proceed with surgery.    Cindy Pong, MD/MPH  Oculofacial Plastic & Reconstructive Surgery      Surgical Plan:  Location: [facility] OR  Procedure/CPT: [phone]  Blepharoplasty, upper eyelid - insurance Both  [phone]  Ectropion Repair - Lateral tarsal strip Both  Anesthesia: Monitored Anesthesia Care + Local  Time: 75 min  Special Needs:none  Follow-up: 2 weeks in clinic

## 2020-07-05 NOTE — Unmapped (Signed)
[facility] Administered Medication Refill Coordination Note      NAME:Cindy [name] DOB: 1941-03-13      Medication: [name]  Day Supply: 21 days      SHIPPING      Next delivery from [facility] [phone]) to [facility] for [name] is scheduled for 11/23  .    Clinic contact: Conan Bowens    Patient's next nurse visit for administration: 12/09.    We will follow up with clinic monthly for standard refill processing and delivery.      Bing Duffey Samella Parr  Specialty Pharmacy Technician

## 2020-07-11 MED FILL — INVEGA SUSTENNA 156 MG/ML INTRAMUSCULAR SYRINGE: 28 days supply | Qty: 1 | Fill #3 | Status: AC

## 2020-07-11 MED FILL — INVEGA SUSTENNA 156 MG/ML INTRAMUSCULAR SYRINGE: INTRAMUSCULAR | 28 days supply | Qty: 1 | Fill #3

## 2020-07-17 NOTE — Unmapped (Signed)
Patient ID: AMYRIE ILLINGWORTH is a 79 y.o. female.    Assessment/Plan:          Diagnoses and all orders for this visit:    Diarrhea, unspecified type  -     CBC; Future  -     GI Pathogen Panel; Future  -     C. Difficile Assay; Future  -     Guaiac Fecal Occult Blood Test (FOBT) x 1; Future    Right lower quadrant abdominal pain  -     CBC; Future  -     Cancel: XR Abdomen 2 Views; Future      Assessment: Watery stools, concerning for acute GI illness. No peritoneal signs. Will proceed with stool testing and CBC.         Subjective:       HPI:    79 year old female accompanied by her daughter today who complains of loose watery stools, abdominal bloating and right lower quadrant pain for 4-5 days. Denies fever chills or blood in stool.    Review of Systems   Constitutional: Negative for fever.   Cardiovascular: Negative.    Respiratory: Negative.    Gastrointestinal: Positive for bloating, abdominal pain, change in bowel habit and diarrhea.           Objective:     Physical Exam  Vitals and nursing note reviewed.   Constitutional:       Appearance: Normal appearance.   Cardiovascular:      Rate and Rhythm: Normal rate and regular rhythm.      Pulses: Normal pulses.   Pulmonary:      Effort: Pulmonary effort is normal.      Breath sounds: Normal breath sounds.   Abdominal:      General: Bowel sounds are normal. There is distension.      Tenderness: There is abdominal tenderness. There is no guarding or rebound.   Neurological:      Mental Status: She is alert.            06/08/20 0830 06/08/20 0837   BP: 157/87 145/82   Pulse: 96 95   Temp: 36.3 ??C (97.4 ??F)    PainSc:   5      Current Outpatient Medications on File Prior to Visit   Medication Sig Dispense Refill   ??? acetaminophen (TYLENOL 8 HOUR) 650 MG CR tablet Take 1,300 mg by mouth every eight (8) hours as needed for pain.     ??? amLODIPine (NORVASC) 5 MG tablet Take 1 tablet (5 mg total) by mouth daily. 90 tablet 3   ??? ASHWAGANDHA ROOT EXTRACT,BULK, MISC Take 800 mg by mouth daily.     ??? carboxymethylcellulose sodium (THERATEARS) 0.25 % Drop Administer 2 drops to both eyes every two (2) hours as needed. (Patient taking differently: Administer 2 drops to both eyes every two (2) hours as needed (dry eyes).) 10 mL 0   ??? diclofenac sodium (VOLTAREN) 1 % gel Apply 4 g topically Four (4) times a day. 500 g 3   ??? diphenhydrAMINE (BENADRYL) 25 mg capsule Take 25 mg by mouth nightly. Patient reported medication to help her sleep     ??? glycerin, adult, Supp Insert 1 suppository into the rectum daily as needed (if bowels are hard.). 15 suppository 1   ??? melatonin 3 mg Tab Take 3 mg by mouth every evening.     ??? olmesartan (BENICAR) 20 MG tablet Take 1 tablet (20 mg total)  by mouth daily. 90 tablet 3   ??? paliperidone palmitate (INVEGA SUSTENNA) 156 mg/mL Syrg Inject 1 mL (156 mg total) into the muscle every twenty-eight (28) days for 12 doses. 1 mL 10     Current Facility-Administered Medications on File Prior to Visit   Medication Dose Route Frequency Provider Last Rate Last Admin   ??? paliperidone palmitate (INVEGA SUSTENNA) 156 mg/mL injection 156 mg  156 mg Intramuscular Q28 Days Cherene Altes, MD   156 mg at 06/26/20 1346     Past Medical History:   Diagnosis Date   ??? ABMD (anterior basement membrane dystrophy)    ??? Allergic rhinitis    ??? Arthritis    ??? Cataract     Nuclear sclerotic cataract OS, BCVA 20/20    ??? Dry eyes    ??? Fibromyalgia    ??? Food intolerance    ??? GERD (gastroesophageal reflux disease)    ??? History of migraine     With Aura   ??? 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 Ptosis     Intermittent ptosis OD   ??? PVD (posterior vitreous detachment), bilateral    ??? Schizoaffective disorder (CMS-HCC)    ??? Sjogren's syndrome (CMS-HCC)

## 2020-07-27 ENCOUNTER — Ambulatory Visit
Admit: 2020-07-27 | Discharge: 2020-07-28 | Payer: MEDICARE | Attending: Psychiatric/Mental Health | Primary: Psychiatric/Mental Health

## 2020-07-27 DIAGNOSIS — F259 Schizoaffective disorder, unspecified: Principal | ICD-10-CM

## 2020-07-27 DIAGNOSIS — F4322 Adjustment disorder with anxiety: Principal | ICD-10-CM

## 2020-07-27 MED ADMIN — paliperidone palmitate (INVEGA SUSTENNA) 156 mg/mL injection 156 mg: 156 mg | INTRAMUSCULAR | @ 20:00:00

## 2020-07-27 NOTE — Unmapped (Signed)
Follow-up instructions:  -- Please continue taking your medications as prescribed for your mental health.   -- Do not make changes to your medications, including taking more or less than prescribed, unless under the supervision of your physician. Be aware that some medications may make you feel worse if abruptly stopped  -- Please refrain from using illicit substances, as these can affect your mood and could cause anxiety or other concerning symptoms.   -- Seek further medical care for any increase in symptoms or new symptoms such as thoughts of wanting to hurt yourself or hurt others.     Contact info:  Life-threatening emergencies: call 911 or go to the nearest ER for medical or psychiatric attention.     Issues that need urgent attention but are not life threatening: call the clinic at [phone].    Non-urgent routine concerns, questions, and refill requests: please call the clinic for assistance.     Regarding appointments:  - If you need to cancel your appointment, we ask that you call [phone] at least 24 hours before your scheduled appointment.  - If for any reason you arrive 15 minutes later than your scheduled appointment time, you may not be seen and your visit may be rescheduled.  - Please remember that we will not automatically reschedule missed appointments.  - If you miss two (2) appointments without letting us know at least 24 hours in advance, you will likely be referred to a provider in your community.  - We will do our best to be on time. Sometimes an emergency will arise that might cause your clinician to be late. We will try to inform you of this when you check in for your appointment. If you wait more than 15 minutes past your appointment time without such notice, please speak with the front desk staff.    In the event of bad weather, the clinic staff will attempt to contact you, should your appointment need to be rescheduled. Additionally, you can call the Patient Weather Line [phone] for system-wide clinic status    For more information and reminders regarding clinic policies (these were provided when you were admitted to the clinic), please ask the front desk.    Arzu Mcgaughey, MPH, PMHNP-BC  Department of Psychiatry  [facility]  [address]  [address]  [place], [place] [address]  Phone: [phone]

## 2020-07-27 NOTE — Unmapped (Signed)
[facility]  Psychiatry   Established Patient E&M Service - Outpatient       Assessment:    VIRGIN ZELLERS presents for follow-up evaluation.     Identifying Information:  IVRY PIGUE is a 79 y.o. female with a history of PPD, schizoaffective disorder and hyperthyroidism who was admitted involuntarily to the [facility] 6/18- 03/01/2020, who presents for evaluation of mood, medication and to establish care. . Endorsing depression related to recent decline of physical abilities. This is Ms. Nordmann first outing in a wheel chair. Today, Ms. Kosta is endorsing anhedonia, low motivation and mood, and sialorrhea that started after receiving [place] injection.    Risk Assessment:  A full psychiatric risk assessment was conducted on 03/16/2020 and risks do not appear significantly changed from that visit.   While future psychiatric events cannot be accurately predicted, the patient does not currently require acute inpatient psychiatric care and does not currently meet [facility] involuntary commitment criteria.      Plan:    Problem: Schizoaffective disorder  Status of problem: chronic and stable  Interventions: Continue Invega Sustenna  156 mg q 28 days.   Discussed possible risk of side effects of Invega including hyperglycemia, NMS, seizures, EPS, hyperprolactinemia, diabetes, dyslipidemia, tardive dyskinesia, sedation, hyper salivation, orthostatic hypotension, tachycardia, injection site reactions.     Problem: Adjustment disorder  Status of problem:  worsening  Interventions: Recently made appt with new therapist. Pt recently experienced significant change in mobility and independence. Also connected to PT that is helping with independence.       Problem: Sialorrhea  Status of problem: improved or improving  Interventions: continue ipratropium bromide SL 1-2 sprays up to 4 x per day  Discussed possible side effects of ipratropium bromide including hypersensitivity reactions, bronchospasm, bladder neck obstruction, intraocular pressure, prostatic hyperplasia, urinary retention, abnormal taste in mouth, MI, dry nasal mucosa, sinusitis, CVA    Problem: Depression  Status of problem:  not improving as expected  Interventions: - restart sertraline 50 mg daily Discussed possible risk of side effects of sertraline including seizure, induction of mania, activation of SI, sexual dysfunction, GI, sedation, HA dizziness, hyponatremia, serotonin syndrome.      Problem: r/o Mild neurocognitive impairment  Status of problem:  new problem to this provider  Interventions: MOCA performed 02/11/20 score:??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 Will repeat at future follow up to reevaluate.     Problem: Insomina  Status of problem:  chronic and stable  Interventions: - continue melatonin 3 mg OTC  -may also take OTC benadryl prn. Aware of risk of anticholinergic side effects.     Problem: Hx of medication side effects  Status of problem:  new problem to this provider  Interventions:Genesight test 03/16/2020     Risks/benefits and indications for treatment with medications above were discussed with the patient. The patient asked appropriate questions, acknowledged understanding of answers, and provided informed consent to initiation&??continuation of medications above.??  ??  Return to clinic appointment: in 3-4 weeks or earlier as needed by patient       Psychotherapy provided:  No billable psychotherapy service provided but brief supportive therapy was utilized.    Patient has been given this writer's contact information as well as the [facility] Psychiatry urgent line number. The patient has been instructed to call 911 for emergencies.      Subjective:    Interval History:   Saw a PA from [facility] who recommended discontinuing Invega sustenna  and starting an alternative medication. Unable to take atropine for sialorrhea due to upcoming eye surgery. It's been traumatic. Unhappy about recent move to assisted living facility. Facility is understaffed and Ms. Mensch often has to wait for someone to help with toilet ing, movement, ADL's. everything is so awkward. [name] reports that it is unclear if Ms. Lengel has been receiving her prescribed sertraline. The prescription seems to have gotten lost in the shuffle.  Will restart sertraline today and assess in 3-4 weeks for improvement in mood and anxiety.     Past medication history: Fluoxetine (not effective), tegretol (anaphylaxis), lithium (dystonia), paliperidone (hypersalivation)       Objective:    Mental Status Exam:  Appearance:    Appears stated age and Clean/Neat   Motor:   No abnormal movements   Speech/Language:    Normal rate, volume, tone, fluency   Mood:   Depressed   Affect:   Depressed   Thought process and Associations:   Logical, linear, clear, coherent, goal directed   Abnormal/psychotic thought content:     Denies SI, HI, self harm, delusions, obsessions, paranoid ideation, or ideas of reference   Perceptual disturbances:     Denies auditory and visual hallucinations, behavior not concerning for response to internal stimuli     Other:            Visit was completed face to face.      [name], PMHNP

## 2020-07-27 NOTE — Unmapped (Signed)
Administered long-acting injectable today, 07/27/2020. See MAR for additional documentation. Meryl Dare, CMA

## 2020-08-01 NOTE — Unmapped (Signed)
[facility] Administered Medication Refill Coordination Note      NAME:Cindy [name] DOB: 1941-01-31      Medication: invega sustenna  Day Supply: 28 days      SHIPPING      Next delivery from [facility] [phone]) to [facility] for Cindy [name] is scheduled for 12/20.    Clinic contact: Adah Salvage      Patient's next nurse visit for administration: 12/21.    We will follow up with clinic monthly for standard refill processing and delivery.      Cindy [name]  Specialty Pharmacy Technician

## 2020-08-04 ENCOUNTER — Ambulatory Visit: Admit: 2020-08-04 | Discharge: 2020-08-05 | Payer: MEDICARE

## 2020-08-04 NOTE — Unmapped (Signed)
[facility] Endocrinology Consult    Assessment/Plan:  79 y.o. female here for evaluation of osteoporosis.  1. Osteoporosis. She would now like to consider bisphosphonate therapy. Plan to start this after she completes her dental work.  - Repeat DXA in 2023.  - Counseled on weight bearing exercise.  - Counseled on fall prevention.  - Counseled on adequate vitamin D and calcium intake.   - Patient to continue vitamin D at current dose and calcium through the diet for now. When she starts alendronate, would also start 500 mg daily of calcium carbonate.  -  Alendronate 70 mg weekly.   - Risks and benefits of this medication were reviewed with the patient. They have elected to pursue this treatment option for their osteoporosis.    2. Subclinical hyperthyroidism. No concerning symptoms for hyperthyroidism. Recommend monitoring only for now.  - TSH and free T4 every 6 - 12 months.    3. Multinodular goiter. RAI uptake and scan previously revealed that her nodules were cold. Last [place] was in 2019. We discussed monitoring plan in depth and I offered repeat ultrasound now or yearly exams. She elected for yearly exams and no additional imaging unless a new symptom arises.  - Yearly thyroid exams.    4. Vitamin D deficiency.  - Continue current vitamin D therapy.  - Check vitamin D yearly with PCP.      All tests and treatments were discussed with the patient who agreed to the plans as documented. They currently have no questions and agree to call the clinic or contact us if questions arise.    Return to clinic: As needed. It would be reasonable for her to follow with her PCP only at this point. I've outline recommended monitoring strategies above.    Alben Spittle, MD  [facility] Department of Endocrinology    Reason for Consult: Osteoporosis.    Provider Requesting Consult: Lennox Pippins    PCP: Orland Mustard, MD    My initial consult from 07/14/2017:  79 y.o. female with relevant history of subclinical hyperthyroidism, multinodular goiter and osteoporosis seen at the request of Dr. Lennox Pippins in consultation for evaluation of osteoporosis. Initial diagnosis of osteoporosis was around 2015. She has been on raloxifene for about a year. She was diagnosed with subclinical hyperthyroidism and a multinodular goiter around 2012. She had a RAI uptake and scan which was normal. No hot nodules identified. She was started on methimazole and her TFTs normalized. She denies any symptoms of hyper or hypothyroidism including tremor, palpitations, unexpected weight change, temperature intolerance or any other symptoms.    Osteoporosis History:  Fracture history:  Low trauma:  No history of low trauma fractures.  Other fractures:  No history of fractures.  Negative history:  No history of hip fractures.  No history of vertebral fractures.  No history of wrist fractures.  No family history of low trauma fracture.  Falls:  Last fall in 2018. No major falls in 2019, 2020 or 2021.  Vitamin D and calcium:  Diet for calcium and 400 units for vitamin D.  Menstrual history:  First menstrual cycle: Age around age 68.  Typically, she had normal menstrual periods.  2 pregnancies with 1 child.  Menopause: Age late 71s to early 35s.  Other:  No history of chronic steroid use.  Treatment:  Raloxifene 30 mg daily (decreased from 60 mg daily due to intolerance) for about 1 year. Stopped over a year ago (probably in 2019).  Consult from today (08/04/2020)  79 y.o. female with relevant history of subclinical hyperthyroidism, multinodular goiter and osteoporosis seen at the request of Dr. Lennox Pippins in consultation for evaluation of osteoporosis. Ms. Zumwalt is now concerned about falling and therefore wants to discuss additional osteoporosis treatment options. She is now using a wheel chair. She lives in a nursing home. She is concerned that she is high risk for falling and wants to improve her bone strength. She has not had any recent falls. She denies any history of fracture. See updated osteoporosis history above.    As for her thyroid, she denies fatigue, constipation, diarrhea, tremor, palpitations, temperature intolerance, unexpected weight change or any other symptoms.     Of note, she has chronic, mild abdominal discomfort. She denies any other symptoms.    Past Medical History:   Diagnosis Date   ??? ABMD (anterior basement membrane dystrophy)    ??? Allergic rhinitis    ??? Arthritis    ??? Cataract     Nuclear sclerotic cataract OS, BCVA 20/20    ??? Dry eyes    ??? Fibromyalgia    ??? Food intolerance    ??? GERD (gastroesophageal reflux disease)    ??? History of migraine     With Aura   ??? 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 Ptosis     Intermittent ptosis OD   ??? PVD (posterior vitreous detachment), bilateral    ??? Schizoaffective disorder (CMS-HCC)    ??? Sjogren's syndrome (CMS-HCC)        Past Surgical History:   Procedure Laterality Date   ??? CATARACT EXTRACTION Right 02/08/2013   ??? HERNIA REPAIR      x4   ??? HYSTERECTOMY     ??? SIGMOIDECTOMY     ??? SKIN BIOPSY         Family History   Problem Relation Age of Onset   ??? Cancer Mother         OVARIAN   ??? Depression Mother    ??? Ovarian cancer Mother    ??? Thyroid disease Father    ??? Hypertension Father    ??? Depression Father    ??? Arthritis Father    ??? Heart disease Father    ??? Thyroid disease Sister    ??? Hypertension Sister    ??? Hypertension Maternal Grandmother    ??? Stroke Maternal Grandmother    ??? Hyperlipidemia Daughter    ??? No Known Problems Maternal Grandfather    ??? No Known Problems Paternal Grandmother    ??? No Known Problems Paternal Grandfather    ??? No Known Problems Sister    ??? No Known Problems Brother    ??? No Known Problems Maternal Aunt    ??? No Known Problems Maternal Uncle    ??? No Known Problems Paternal Aunt    ??? No Known Problems Paternal Uncle    ??? No Known Problems Other    ??? Alcohol abuse Neg Hx    ??? COPD Neg Hx    ??? Diabetes Neg Hx    ??? Drug abuse Neg Hx    ??? Hearing loss Neg Hx    ??? Vision loss Neg Hx    ??? Learning disabilities Neg Hx    ??? Melanoma Neg Hx    ??? Basal cell carcinoma Neg Hx    ??? Squamous cell carcinoma Neg Hx    ??? Amblyopia Neg Hx    ??? Blindness  Neg Hx    ??? Cataracts Neg Hx    ??? Glaucoma Neg Hx    ??? Macular degeneration Neg Hx    ??? Retinal detachment Neg Hx    ??? Strabismus Neg Hx        Social History     Socioeconomic History   ??? Marital status: Single     Spouse name: Not on file   ??? Number of children: Not on file   ??? Years of education: Not on file   ??? Highest education level: Not on file   Occupational History   ??? Not on file   Tobacco Use   ??? Smoking status: Former Smoker     Years: 30.00     Quit date: 08/19/1993     Years since quitting: 26.9   ??? Smokeless tobacco: Never Used   Vaping Use   ??? Vaping Use: Never used   Substance and Sexual Activity   ??? Alcohol use: Yes     Alcohol/week: 1.0 standard drink     Types: 1 Glasses of wine per week     Comment: <1 / month    ??? Drug use: Not Currently   ??? Sexual activity: Not Currently   Other Topics Concern   ??? Do you use sunscreen? Yes   ??? Tanning bed use? No   ??? Are you easily burned? No   ??? Excessive sun exposure? No   ??? Blistering sunburns? No   Social History Narrative    Lives alone with her dog, [place].  Daughter in [place], sister in [place].  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     Social Determinants of Health     Financial Resource Strain: Unknown   ??? Difficulty of Paying Living Expenses: Patient refused   Food Insecurity: No Food Insecurity   ??? Worried About Running Out of Food in the Last Year: Never true   ??? Ran Out of Food in the Last Year: Never true   Transportation Needs: No Transportation Needs   ??? Lack of Transportation (Medical): No   ??? Lack of Transportation (Non-Medical): No   Physical Activity: Not on file   Stress: Not on file   Social Connections: Not on file       Review of systems:  General ROS:   10 system review of systems was performed and negative except as stated above.    Medication list:  Current Outpatient Medications on File Prior to Visit   Medication Sig Dispense Refill   ??? acetaminophen (TYLENOL 8 HOUR) 650 MG CR tablet Take 1,300 mg by mouth every eight (8) hours as needed for pain.     ??? amLODIPine (NORVASC) 5 MG tablet Take 1 tablet (5 mg total) by mouth daily. 90 tablet 3   ??? ASHWAGANDHA ROOT EXTRACT,BULK, MISC Take 800 mg by mouth daily.     ??? carboxymethylcellulose sodium (THERATEARS) 0.25 % Drop Administer 2 drops to both eyes every two (2) hours as needed. (Patient taking differently: Administer 2 drops to both eyes every two (2) hours as needed (dry eyes).) 10 mL 0   ??? cholecalciferol, vitamin D3-50 mcg, 2,000 unit,, 50 mcg (2,000 unit) tablet Take 1 tablet (50 mcg total) by mouth daily.  0   ??? diclofenac sodium (VOLTAREN) 1 % gel Apply 4 g topically Four (4) times a day. 500 g 3   ??? diphenhydrAMINE (BENADRYL) 25 mg capsule Take 25 mg by mouth  nightly. Patient reported medication to help her sleep     ??? docusate sodium (COLACE) 100 MG capsule Take 1 capsule (100 mg total) by mouth daily.  0   ??? gabapentin (NEURONTIN) 100 MG capsule PLEASE SEE ATTACHED FOR DETAILED DIRECTIONS 270 capsule 1   ??? glycerin, adult, Supp Insert 1 suppository into the rectum daily as needed (if bowels are hard.). 15 suppository 1   ??? lidocaine HCL (ASPERCREME, LIDOCAINE HCL,) 4 % lqro Apply 1 application topically Three (3) times a day as needed.  0   ??? melatonin 3 mg Tab Take 3 mg by mouth every evening.     ??? olmesartan (BENICAR) 20 MG tablet Take 1 tablet (20 mg total) by mouth daily. 90 tablet 3   ??? paliperidone palmitate (INVEGA SUSTENNA) 156 mg/mL Syrg Inject 1 mL (156 mg total) into the muscle every twenty-eight (28) days for 12 doses. 1 mL 10   ??? sertraline (ZOLOFT) 50 MG tablet Take 1 tablet (50 mg total) by mouth daily. Take half a tablet by mouth for 4 days. Then take 1 tablet by mouth daily 90 tablet 0     Current Facility-Administered Medications on File Prior to Visit   Medication Dose Route Frequency Provider Last Rate Last Admin   ??? paliperidone palmitate (INVEGA SUSTENNA) 156 mg/mL injection 156 mg  156 mg Intramuscular Q28 Days Cherene Altes, MD   156 mg at 07/27/20 1457       Physical exam:  Vitals:    08/04/20 1032   BP: 118/65   Pulse: 70   Resp: 16     Wt Readings from Last 3 Encounters:   08/04/20 49.9 kg (110 lb)   07/27/20 49.9 kg (110 lb)   06/16/20 54.4 kg (120 lb)     Physical Exam  Constitutional:       General: She is not in acute distress.     Appearance: Normal appearance. She is not ill-appearing, toxic-appearing or diaphoretic.   HENT:      Head: Normocephalic and atraumatic.      Right Ear: External ear normal.      Left Ear: External ear normal.      Nose: Nose normal. No congestion or rhinorrhea.      Mouth/Throat:      Mouth: Mucous membranes are moist.      Pharynx: No oropharyngeal exudate or posterior oropharyngeal erythema.   Eyes:      General: No scleral icterus.        Right eye: No discharge.         Left eye: No discharge.      Extraocular Movements: Extraocular movements intact.      Conjunctiva/sclera: Conjunctivae normal.   Neck:      Comments: Nodular thyroid. Normal in size.  Cardiovascular:      Rate and Rhythm: Normal rate and regular rhythm.      Heart sounds: No murmur heard.      Pulmonary:      Effort: Pulmonary effort is normal. No respiratory distress.      Breath sounds: Normal breath sounds.   Abdominal:      General: Bowel sounds are normal. There is no distension.      Palpations: Abdomen is soft. There is no mass.   Musculoskeletal:         General: No swelling or tenderness. Normal range of motion.      Cervical back: Normal range of motion and neck supple. No rigidity or  tenderness.   Lymphadenopathy:      Cervical: No cervical adenopathy.   Skin:     General: Skin is warm and dry.      Coloration: Skin is not jaundiced.   Neurological:      General: No focal deficit present.      Mental Status: She is alert and oriented to person, place, and time.   Psychiatric:         Mood and Affect: Mood normal.         Behavior: Behavior normal.           Labs reviewed:  Lab Results   Component Value Date    A1C 5.4 02/06/2020       Lab Results   Component Value Date    NA 137 01/31/2020    K 3.7 01/31/2020    CL 105 01/31/2020    CO2 24.6 01/31/2020    BUN 12 01/31/2020    CREATININE 0.51 01/31/2020    GLU 118 01/31/2020    CALCIUM 9.3 02/23/2020    ALBUMIN 4.2 01/28/2020       Lab Results   Component Value Date    TSH 0.046 (L) 04/20/2020    T3TOTAL 1.0 05/30/2011     Results for CLOTINE, HEINER (MRN [id number]) as of 08/04/2020 10:47   Ref. Range 07/14/2017 15:34 11/05/2017 13:52 06/17/2018 11:21   Vitamin D Total (25OH) Latest Ref Range: 20.0 - 80.0 ng/mL 28.5 39.3 37.7       Other studies reviewed:  Dexa Bone Density Skeletal  Order: [id number]   Status: Final result ??   Visible to patient: Yes (seen) ??   Next appt: 08/08/2020 at 10:30 AM in [facility] Rayburn Go, PMHNP) ??   Dx: Menopause; Osteoporosis, unspecified ... ??   1 Result Note ??   1 HM Topic    Details    Reading Physician Reading Date Result Priority   [place] Bayfield Renner, [place]  [phone] 10/18/2019      Narrative & Impression  EXAM: DEXA BONE DENSITY SKELETAL  DATE: 10/14/2019 2:55 PM  ACCESSION: [id number] UN  DICTATED: 10/18/2019 9:27 AM  INTERPRETATION LOCATION: [facility]  ??  CLINICAL INDICATION: 79 years old Female with postmenopausal estrogen deficiency  ??  TECHNIQUE: Dual energy x-ray absorptiometry was performed assessing the bone mineral density in the lumbar spine and the proximal left femur using a [employer].    The current examination is compared to a recent measurement dated 10/28/2017 and a baseline measurement dated 08/13/2012.   ??  FINDINGS: The bone mineral density in the spine measuring L1 to 2 measures 1.036 gm/cm2.  The  Z score is 3.0 and the T score is 0.5.  This value is above the fracture risk threshold.  This represents an insignificant decrease of 1.1% when compared with the recent measurement of 1.047 gm/cm2 and a significant increase of 2.3% when compared with a baseline measurement of 1.013 gm/cm2.  ??  The total bone mineral density in the proximal left femur measures 0.641 gm/cm2.  The Z score is -0.4 and the T score is -2.5.  This represents an insignificant increase of 2.7% when compared to the recent measurement of 0.624 gm/cm2 and an insignificant increase of 2.9% when compared with the baseline measurement of 0.623 gm/cm2.  The femoral neck density is 0.675 gm/cm2, and the femoral neck T score is -1.6.  The other T scores range from -2.7 to -2.4.  Except for the femoral neck,  these values are below the fracture risk thresholds.  ??  IMPRESSION:  Lumbar spine: Normal bone density.  The measurement has increased significantly since baseline.  Left proximal femur:The femoral neck density indicates osteopenia, but the total femoral density indicates osteoporosis.  The measurement has not changed significantly since prior studies.     US Thyroid  Order: [id number]   Status: Final result ??   Visible to patient: Yes (seen) ??   Next appt: 08/08/2020 at 10:30 AM in [facility]iat[facility] Rayburn Go, PMHNP) ??   Dx: Goiter, non-toxic ??   0 Result Notes    Details    Reading Physician Reading Date Result Priority   Noni Saupe, MD  [phone] 12/22/2017    [facility] 12/22/2017    Reece Levy, MD  [phone] 12/22/2017      Narrative & Impression  EXAM: US THYROID  DATE: 12/22/2017 1:17 PM  ACCESSION: [id number] UN  DICTATED: 12/22/2017 1:36 PM  INTERPRETATION LOCATION: [facility]  ??  CLINICAL INDICATION: 79 years old Female with Follow-up thyroid nodulesE04.9-Goiter, non-toxic   ??  COMPARISON: 07/23/17  ??  TECHNIQUE:  Ultrasound views of the thyroid were obtained using gray scale and limited color Doppler imaging.  ??  FINDINGS:  Thyroid size: see below  ??  Thyroid echotexture: Heterogeneous with multiple subcentimeter hypoechoic and cystic nodules bilaterally.  ??  Lymph nodes: No adenopathy  ??  Nodule: 1  Size: 1.8 x 1.4 x 1.1 cm, unchanged  Location: Isthmus   Composition: mixed cystic and solid (1)  Echogenicity: Isoechoic (1)  Shape: Not taller than wide (0)  Margin: ill defined (0)  Echogenic foci: None (0)  ??  ACR TI-RADS total points: 2  ACR TI-RADS risk category: TI-RADS 2  ACR TI-RADS recommendation: No further follow up needed  ??  Nodule: 2  Size: 1.2 x 1.0 x 0.8 cm, previously 1.4 x 1.0 x 0.9 cm  Location: Right Upper  Composition: mixed cystic and solid (1)  Echogenicity: Isoechoic (1)  Shape: Not taller than wide (0)  Margin: Smooth (0)  Echogenic foci: None (0)  ??  ACR TI-RADS total points: 2  ACR TI-RADS risk category: TI-RADS 2  ACR TI-RADS recommendation: No further follow up needed  ??  Nodule: 3  Size: 2.3 x 1.8 x 1.8 cm   Location: Left inferior  Composition: mixed cystic and solid (1)  Echogenicity: Isoechoic (1)  Shape: Not taller than wide (0)  Margin: Smooth (0)  Echogenic foci: None (0)  ??  ACR TI-RADS total points: 2  ACR TI-RADS risk category: TI-RADS 2  ACR TI-RADS recommendation: No further follow up needed  ??  Nodule: 4  Size: 1.8 x 1.3 x 1.4 cm, previously 2.6 x 2.1 x 2.4 cm  Location: Left Mid  Composition: solid or almost completely solid (2)  Echogenicity: Isoechoic (1)  Shape: Not taller than wide (0)  Margin: Smooth (0)  Echogenic foci: None (0)  ??  ACR TI-RADS total points: 3  ACR TI-RADS risk category: TI-RADS 3  ACR TI-RADS recommendation: Follow up ultrasound in 1 year  ??  IMPRESSION:  Heterogeneous multinodular thyroid, similar to prior.  TI-RADS 3: 1.8 cm Left mid thyroid nodule, recommend follow-up ultrasound in one year, unchanged.  TI-RADS 2: Unchanged nodule in the right and left thyroid lobe and isthmus. No further imaging follow-up is needed.

## 2020-08-07 MED ORDER — ERYTHROMYCIN 5 MG/GRAM (0.5 %) EYE OINTMENT
0 days
Start: 2020-08-07 — End: ?

## 2020-08-07 MED ORDER — KETOCONAZOLE 2 % SHAMPOO
0 days
Start: 2020-08-07 — End: ?

## 2020-08-07 MED FILL — INVEGA SUSTENNA 156 MG/ML INTRAMUSCULAR SYRINGE: 28 days supply | Qty: 1 | Fill #4 | Status: AC

## 2020-08-07 MED FILL — INVEGA SUSTENNA 156 MG/ML INTRAMUSCULAR SYRINGE: INTRAMUSCULAR | 28 days supply | Qty: 1 | Fill #4

## 2020-08-08 ENCOUNTER — Ambulatory Visit
Admit: 2020-08-08 | Discharge: 2020-08-09 | Payer: MEDICARE | Attending: Psychiatric/Mental Health | Primary: Psychiatric/Mental Health

## 2020-08-08 ENCOUNTER — Ambulatory Visit: Admit: 2020-08-08 | Discharge: 2020-08-09 | Payer: MEDICARE

## 2020-08-08 DIAGNOSIS — F259 Schizoaffective disorder, unspecified: Principal | ICD-10-CM

## 2020-08-08 DIAGNOSIS — F4322 Adjustment disorder with anxiety: Principal | ICD-10-CM

## 2020-08-08 DIAGNOSIS — R269 Unspecified abnormalities of gait and mobility: Principal | ICD-10-CM

## 2020-08-08 DIAGNOSIS — M48061 Spinal stenosis, lumbar region without neurogenic claudication: Principal | ICD-10-CM

## 2020-08-08 NOTE — Unmapped (Signed)
NEUROSURGERY CLINIC NOTE      Assessment and Recommendations  Cindy [name] is a 79 y.o. 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  Due to patient's scoliotic deformity, I am concerned with the surgical morbidity with just a simple laminectomy.  Therefore due to patient's advanced age, poor functional status, medical co-morbidities (osteoporosis), and long standing symptoms, I discussed with the patient and daughter that I feel that she is not a surgical candidate as the risks significantly outweigh the benefits.    -No surgical intervention indicated  -follow up as needed      Encounter diagnoses   Diagnosis ICD-10-CM Associated Orders   1. Lumbar stenosis without neurogenic claudication  M48.061 Ambulatory referral to Spine Center       History of present illness  Cindy [name] is a 79 y.o. female with PMH of IBS, schizoaffective disorder, osteoporosis, HTN  whom we are seeing today in neurosurgical clinic at the request of [name] for evaluation of severe lumbar stenosis.  Of note, patient previously seen by my complex spine partner Dr. Kevan Rosebush in 2018 for cervical and lumbar stenosis in which surgical intervention was not offered due to her advanced age and significant deformity.  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  The daughter returns with the mother due to reported significant progression in her symptoms.  Specifically in reports of progressing LE weakness and paraesthesias when upright. Does report months of BB incontinence but without decreased sensation and does report difficulties with IBS.  Currently uses a wheelchair for mobility.  Patient is currently in an assisted living facility and unable to perform ADLs.    Review of Systems  A 10-system review of systems was conducted and was negative except as documented above in the HPI.    Physical Exam    Vital Signs: BP 114/67  - Pulse 87  - Temp 37.2 ??C (98.9 ??F) (Temporal)  - Ht 154.9 cm (5' 1)  - LMP  (LMP Unknown)  - BMI 20.78 kg/m??   General: No acute distress; Body mass index is 20.78 kg/m??.  Cardiovascular: Warm and well perfused with good pulses, no edema  Pulmonary: Unlabored breathing, no pursed lips or wheezing, acyanotic  Skin: No petechiae, rashes or ecchymoses   Eyes: without scleral icterus    Neurological Exam:  Frail, thin  Answers to some questioning  Oriented x3  Hunched posture in wheelchair    Cns intact    Sensation: Intact to touch in the upper and lower extremities    Musculoskeletal:  Gait normal. Station stable.  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    Past Medical History  Past Medical History:   Diagnosis Date   ??? ABMD (anterior basement membrane dystrophy)    ??? Allergic rhinitis    ??? Arthritis    ??? Cataract     Nuclear sclerotic cataract OS, BCVA 20/20    ??? Dry eyes    ??? Fibromyalgia    ??? Food intolerance    ??? GERD (gastroesophageal reflux disease)    ??? History of migraine     With Aura   ??? 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 Ptosis     Intermittent ptosis OD   ??? PVD (posterior vitreous detachment), bilateral    ??? Schizoaffective disorder (CMS-HCC)    ??? Sjogren's syndrome (CMS-HCC)        Past Surgical History  Past Surgical History:   Procedure Laterality Date   ??? CATARACT EXTRACTION Right 02/08/2013   ??? HERNIA REPAIR      x4   ??? HYSTERECTOMY     ??? SIGMOIDECTOMY     ??? SKIN BIOPSY         Family History  Family History   Problem Relation Age of Onset   ??? Cancer Mother         OVARIAN   ??? Depression Mother    ??? Ovarian cancer Mother    ??? Thyroid disease Father    ??? Hypertension Father    ??? Depression Father    ??? Arthritis Father    ??? Heart disease Father    ??? Thyroid disease Sister    ??? Hypertension Sister    ??? Hypertension Maternal Grandmother    ??? Stroke Maternal Grandmother    ??? Hyperlipidemia Daughter    ??? No Known Problems Maternal Grandfather    ??? No Known Problems Paternal Grandmother    ??? No Known Problems Paternal Grandfather    ??? No Known Problems Sister    ??? No Known Problems Brother    ??? No Known Problems Maternal Aunt    ??? No Known Problems Maternal Uncle    ??? No Known Problems Paternal Aunt    ??? No Known Problems Paternal Uncle    ??? No Known Problems Other    ??? Alcohol abuse Neg Hx    ??? COPD Neg Hx    ??? Diabetes Neg Hx    ??? Drug abuse Neg Hx    ??? Hearing loss Neg Hx    ??? Vision loss Neg Hx    ??? Learning disabilities Neg Hx    ??? Melanoma Neg Hx    ??? Basal cell carcinoma Neg Hx    ??? Squamous cell carcinoma Neg Hx    ??? Amblyopia Neg Hx    ??? Blindness Neg Hx    ??? Cataracts Neg Hx    ??? Glaucoma Neg Hx    ??? Macular degeneration Neg Hx    ??? Retinal detachment Neg Hx    ??? Strabismus Neg Hx        Social History  Social History  Tobacco Use   ??? Smoking status: Former Smoker     Years: 30.00     Quit date: 08/19/1993     Years since quitting: 26.9   ??? Smokeless tobacco: Never Used   Vaping Use   ??? Vaping Use: Never used   Substance Use Topics   ??? Alcohol use: Yes     Alcohol/week: 1.0 standard drink     Types: 1 Glasses of wine per week     Comment: <1 / month    ??? Drug use: Not Currently

## 2020-08-08 NOTE — Unmapped (Signed)
Follow-up instructions:  -- Please continue taking your medications as prescribed for your mental health.   -- Do not make changes to your medications, including taking more or less than prescribed, unless under the supervision of your physician. Be aware that some medications may make you feel worse if abruptly stopped  -- Please refrain from using illicit substances, as these can affect your mood and could cause anxiety or other concerning symptoms.   -- Seek further medical care for any increase in symptoms or new symptoms such as thoughts of wanting to hurt yourself or hurt others.     Contact info:  Life-threatening emergencies: call 911 or go to the nearest ER for medical or psychiatric attention.     Issues that need urgent attention but are not life threatening: call the clinic at [phone].    Non-urgent routine concerns, questions, and refill requests: please call the clinic for assistance.     Regarding appointments:  - If you need to cancel your appointment, we ask that you call [phone] at least 24 hours before your scheduled appointment.  - If for any reason you arrive 15 minutes later than your scheduled appointment time, you may not be seen and your visit may be rescheduled.  - Please remember that we will not automatically reschedule missed appointments.  - If you miss two (2) appointments without letting us know at least 24 hours in advance, you will likely be referred to a provider in your community.  - We will do our best to be on time. Sometimes an emergency will arise that might cause your clinician to be late. We will try to inform you of this when you check in for your appointment. If you wait more than 15 minutes past your appointment time without such notice, please speak with the front desk staff.    In the event of bad weather, the clinic staff will attempt to contact you, should your appointment need to be rescheduled. Additionally, you can call the Patient Weather Line [phone] for system-wide clinic status    For more information and reminders regarding clinic policies (these were provided when you were admitted to the clinic), please ask the front desk.    Aaren Krog, MPH, PMHNP-BC  Department of Psychiatry  [facility]  [address]  [address]  [place], [place] [address]  Phone: [phone]

## 2020-08-08 NOTE — Unmapped (Signed)
I do not recommend surgery.  Follow up as needed

## 2020-08-08 NOTE — Unmapped (Signed)
[facility]  Psychiatry   Established Patient E&M Service - Outpatient       Assessment:    Cindy [name] presents for follow-up evaluation.     Identifying Information:  Cindy [name] is a 79 y.o. female with a history of PPD, schizoaffective disorder and hyperthyroidism who was admitted involuntarily to the [facility] unit 6/18- 03/01/2020, who presents for evaluation of mood, medication and to establish care. . Endorsing depression related to recent decline of physical abilities. This is Cindy [name] first outing in a wheel chair. Today, Cindy [name] is endorsing anhedonia, low motivation and mood, and sialorrhea that started after receiving [place] injection.    Risk Assessment:  A full psychiatric risk assessment was conducted on 03/16/2020 and risks do not appear significantly changed from that visit.   While future psychiatric events cannot be accurately predicted, the patient does not currently require acute inpatient psychiatric care and does not currently meet [facility] involuntary commitment criteria.      Plan:    Problem: Schizoaffective disorder  Status of problem: chronic and stable  Interventions: Continue Invega Sustenna  156 mg q 28 days. Last injection   Discussed possible risk of side effects of Invega including hyperglycemia, NMS, seizures, EPS, hyperprolactinemia, diabetes, dyslipidemia, tardive dyskinesia, sedation, hyper salivation, orthostatic hypotension, tachycardia, injection site reactions.     Problem: Adjustment disorder  Status of problem:  worsening  Interventions: Recently made appt with new therapist. Pt recently experienced significant change in mobility and independence. Also connected to PT that is helping with independence.       Problem: Sialorrhea  Status of problem: improved or improving  Interventions: continue ipratropium bromide SL 1-2 sprays up to 4 x per day  Discussed possible side effects of ipratropium bromide including hypersensitivity reactions, bronchospasm, bladder neck obstruction, intraocular pressure, prostatic hyperplasia, urinary retention, abnormal taste in mouth, MI, dry nasal mucosa, sinusitis, CVA    Problem: Depression  Status of problem:  not improving as expected and worsening  Interventions: - continue sertraline 50 mg daily Discussed possible risk of side effects of sertraline including seizure, induction of mania, activation of SI, sexual dysfunction, GI, sedation, HA dizziness, hyponatremia, serotonin syndrome.      Problem: r/o Mild neurocognitive impairment  Status of problem:  new problem to this provider  Interventions: MOCA performed 02/11/20 score:??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 Will repeat at future follow up to reevaluate.     Problem: Insomina  Status of problem:  chronic and stable  Interventions: - continue melatonin 3 mg OTC  -may also take OTC benadryl prn. Aware of risk of anticholinergic side effects.     Problem: Hx of medication side effects  Status of problem:  continue to assess  Interventions:Genesight test 03/16/2020     Risks/benefits and indications for treatment with medications above were discussed with the patient. The patient asked appropriate questions, acknowledged understanding of answers, and provided informed consent to initiation&??continuation of medications above.??  ??  Return to clinic appointment: in 3-4 weeks or earlier as needed by patient     Psychotherapy provided:  No billable psychotherapy service provided but brief supportive therapy was utilized.    Patient has been given this writer's contact information as well as the [facility] Psychiatry urgent line number. The patient has been instructed to call 911 for emergencies.      Subjective:    Interval History:   Describes minimal improvement in anxiety in mood since restarting sertraline 2 weeks ago.  Experiencing anxiety and stress from feeling like staff at her assisted living find her to be a burden, do not answer her calls, may leave her in the bathroom and not return. [name] is aware and communicating with managers at the facility. Medications seem to be given as ordered. Experiencing sadness about loss of independence. Goal oriented with following up with specialists to address pain and possible option of surgery.   Past medication history: Fluoxetine (not effective), tegretol (anaphylaxis), lithium (dystonia), paliperidone (hypersalivation)       Objective:    Mental Status Exam:  Appearance:    Appears stated age and Clean/Neat   Motor:   No abnormal movements   Speech/Language:    Normal rate, volume, tone, fluency   Mood:   Depressed   Affect:   Depressed   Thought process and Associations:   Logical, linear, clear, coherent, goal directed   Abnormal/psychotic thought content:     Denies SI, HI, self harm, delusions, obsessions, paranoid ideation, or ideas of reference   Perceptual disturbances:     Denies auditory and visual hallucinations, behavior not concerning for response to internal stimuli     Other:            Visit was completed face to face.      [name], PMHNP

## 2020-08-14 NOTE — Unmapped (Signed)
It would be best for pt. to schedule an appt. with Dr. Oneida Arenas to discuss these questions. This can be a virtual visit.

## 2020-08-15 NOTE — Unmapped (Signed)
Reviewed. Action taken in a different encounter.

## 2020-08-16 NOTE — Unmapped (Signed)
Reviewed message. No action needed at this time.

## 2020-08-22 ENCOUNTER — Telehealth
Admit: 2020-08-22 | Discharge: 2020-08-23 | Payer: MEDICARE | Attending: Physical Medicine & Rehabilitation | Primary: Physical Medicine & Rehabilitation

## 2020-08-22 DIAGNOSIS — M48061 Spinal stenosis, lumbar region without neurogenic claudication: Principal | ICD-10-CM

## 2020-08-23 NOTE — Unmapped (Signed)
[facility] Administered Medication Refill Coordination Note      NAME:Cindy [name] DOB: 04/10/1941      Medication: invega sustenna  Day Supply: 28 days      SHIPPING      Next delivery from [facility] [phone]) to [facility] for Cindy [name] is scheduled for 01/12.    Clinic contact: Elson Clan    Patient's next nurse visit for administration: 01/12.    We will follow up with clinic monthly for standard refill processing and delivery.      Tawn Fitzner Samella Parr  Specialty Pharmacy Technician

## 2020-08-23 NOTE — Unmapped (Signed)
Dear Ms. Cindy [name],    Thank you for coming to see [place] today. It was a pleasure to see you. This summary reviews the goals and plans we discussed at your visit today.     Below, you will see: a) your working diagnosis b) your treatment plan and c) your next steps and followup plan.    We care about your quality of life and are committed to helping optimize your functionality.     DIAGNOSIS:   1. Spinal stenosis of lumbar region, unspecified whether neurogenic claudication present       TREATMENT PLAN:   ?? Schedule repeat Caudal epidural steroid injection to try to provide temporary relief.   ?? Continue with Physical Therapy.   ?? You may purchase an over the counter leg air massaging boots (without the heat function) to be used daily after breakfast when spasms are more prominent    Increase gabapentin as tolerated. Continue taking 1 pill (300mg ) nightly, increase every 3-5 days until talking 1 pill, 3 times a day (or 1 pill in the morning and 2 at night).  If well tolerated, can increase further to two pills three times a day.  ?? Continue topical medications.   ?? Updated prescription for Voltaren gel = topical anti-inflmmatory to use on your knee, you may use this three times a day  ?? Daily lidocaine patches for calf and gluteal muscles okay to use, these patches can stay on for 12 hours at a time  ?? Referral to Pain management to be considered for MILD procedure   ?? Will fax recommendations/orders to [facility] living: fax #[phone]    NEXT STEPS/FOLLOW UP:   ?? Follow-up here in 2 months  ?? Discuss updating AFOs at next visit      You may contact me through MyChart of call our office with any questions.     If you develop any red flag signs such as new or progressive motor weakness, sensory deficits, saddle anesthesia (groin numbness), bowel/bladder dysfunction, gait/coordination disturbance, weight loss, or night pain, you should call our office and/or seek prompt evaluation at the nearest ER.    All my best,    Asa Saunas, MD  Assistant Professor - PM&R  Interventional Spine Specialist - [facility]  Staff Physiatrist [facility]   [place] [facility] [place] [place] [facility] [employer]

## 2020-08-23 NOTE — Unmapped (Signed)
[facility] Physical Medicine and Rehabilitation   Follow-Up Note    Patient Name:Cindy [name]  MRN: [id number]  DOB: 10-18-1940  Age: 80 y.o.   Date: 08/22/2020  Physician: Murvin Natal, MD    Patient Virtual Encounter    Exam noted below reflects most recent in-person visit if available and is included as a reference regarding patient's functional status.  Any additional exam that could be conducted with audio/visual communication was added as necessary.     Location of provider during encounter: [facility]          The patient reports they are currently: at home. I spent 50 minutes on the phone with the patient on the date of service. I spent an additional 10 minutes on pre- and post-visit activities on the date of service.     The patient was physically located in [place] or a state in which I am permitted to provide care. The patient and/or parent/guardian understood that s/he may incur co-pays and cost sharing, and agreed to the telemedicine visit. The visit was reasonable and appropriate under the circumstances given the patient's presentation at the time.    The patient and/or parent/guardian has been advised of the potential risks and limitations of this mode of treatment (including, but not limited to, the absence of in-person examination) and has agreed to be treated using telemedicine. The patient's/patient's family's questions regarding telemedicine have been answered.     If the visit was completed in an ambulatory setting, the patient and/or parent/guardian has also been advised to contact their provider???s office for worsening conditions, and seek emergency medical treatment and/or call 911 if the patient deems either necessary.      ASSESSMENT & PLAN:     80 y.o. old woman with osteopenia, right knee OA, right foot drop & lumbar radiculopathy being co-managed with [facility] for ESI's. MRI Spine from September 2019 with severe neural foraminal narrowing is also noted on the right at L4-L5 and severe central canal narrowing L2-L5. Saw neurosurgery in November 2013 & concerned for her bone health when considering operative intervention, but her osteopenia has since been treated. She also remains progressively symptomatic. 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    06/05/18: MRI with progressive stenosis and EMG with progressive right L5 radiculopathy which matches her foot drop. She has not improved with extensive conservative care (PT, traction, chiropractor, ESIs, and several assistive devices). She is open to repeat surgical assessment. She again had several other adjunct ideas, but we discussed how these are not addressing her mechanical problem.   Discussed also that given timeline, surgery may not allow complete resolution of her foot drop, but goal would be to prevent further decline. Fortunately, she has used e-stim for her right Tib Ant and has maintained muscle bulk that may help provide return of function once nerve decompressed.     07/31/18: Follows-up after seeing NSG at [facility] for her foot drop, who did no recommend surgical intervention for her lumbar spine. Before then, she brought another provider into the mix and saw a spine specialist at [facility] who ordered an MRI of the C-spine with moderate stenosis. No surgery was indicated for her C-spine either, per [facility]. Has not followed up with [facility]. She saw her PCP a which time she indicated a desire to switch to home PT, in part given concerns with driving.  Today, her main concern appears to be function in her hands, though this appears more related to arthritis and skin rash than radicular pain and weakness.   Discussed need to consolidate care, as she has been seeking more data points and getting fragmented care at [facility], and [facility], which I believe is adding to confusion and redundancy in treatment.     10/16/2018: Patient returns to clinic for an evaluation. She has been seen by [facility] and is not a good surgical candidate. She has multiple questions about their conversation regarding surgery. She complains today of multiple areas of pain, which have been addressed by her chiropractor by gentle techniques including spinal traction, myofascial release, and manipulation. Today she is seeking alternative ways to manage her pain, including acupressure/puncture. She has not started [facility] PT due to having multiple agencies at once and not answering phone calls. She reports her bracing is ill-fitting, but did not bring it with her today.     05/30/2020: Follows up for progressive neurologic and functional decline. Her right foot drop has worsened. Her right > left knee pain has worsened. As a result her mobility has worsened and despite knee and ankle braces and walker/cane, mobility is more strenuous and she fatigues more easily and has avoided moving for fear of falling, further compounding her deconditioning.   She has known severe lumbar stenosis, but previously was told she was a poor surgical candidate at both [facility] and [facility] given her likely need for extensive surgery, but that was while her symptoms were stable, and she has had progressive worsening, so will get updated MRI and have them follow-up with them again. She has had temporary relief with ESI in the past, so will repeat. She has already failed extensive PT, bracing, e-stim, chiropractic care, etc.   Also significantly limited by knee pain and instability, has severe OA bilaterally, minimal relief with bracing and injections. Encouraged follow-up with the total joint team.   They also asked about RLS, but she has failed gabapentin and requip. Discussed this and her constipation and other symptoms are likely related to lumbar stenosis, but again may not have a definitive treatment. Will reassess after updated MRI and surgical reassessment. 08/22/2020:  Patient's daughter, [name], is the primary point of contact for today's telephone visit today. Patient continues to have ongoing back pain that she describes as a discomfort. Patients main concern today are muscle spasms in her gluteal muscles and calves. Patient had a caudal ESI on 06/22/2020 that patient reports it relieved 30% of the pain and it still providing relief. Since patient had improvement, another Caudal ESI may be beneficial.       DIAGNOSIS:     ICD-10-CM   1. Spinal stenosis of lumbar region, unspecified whether neurogenic claudication present  M48.061      TREATMENT PLAN:   ?? Schedule repeat Caudal epidural steroid injection to try to provide temporary relief.   ?? Continue with Physical Therapy.   ?? You may purchase an over the counter leg air massaging boots (without the heat function) to be used daily after breakfast when spasms are more prominent    Increase gabapentin as tolerated. Continue taking 1 pill (300mg ) nightly, increase every 3-5 days until talking 1 pill, 3 times a day (or 1 pill in the morning and 2 at night).  If well tolerated, can increase further to two pills three times a day.  ?? Continue topical medications.   ?? Updated prescription for Voltaren gel =  topical anti-inflmmatory to use on your knee, you may use this three times a day  ?? Daily lidocaine patches for calf and gluteal muscles okay to use, these patches can stay on for 12 hours at a time  ?? Referral to Pain management to be considered for MILD procedure   ?? Will fax recommendations/orders to [facility] living: fax #[phone]    NEXT STEPS/FOLLOW UP:   ?? Follow-up here in 2 months  ?? Discuss updating AFOs at next visit        Phone call started: 4:28pm, ended 5:23pm    SUBJECTIVE:     Chief complaint: Knee pain, foot drop    History of present Illness:  Pt is a 80 y.o. female seen for follow-up regarding RIGHT FOOT DROP. Re-referred for 80 yo F previous patient of spine center who now has worsening right sided foot drop, loss of proprioception. Working with PT/OT. Currently would not be a good surgical candidate, but would like some clarity on her diagnosis and prognosis to help work with PT/OT and improve function as much as possible.     08/22/2020:  Patient's daughter, [name], is the primary point of contact for this visit today. Patient continues to have ongoing back pain that she describes as a discomfort. Patients main concern are her muscle spasms. Patient is having muscle spasms in her gluteal muscles and calves. Patient had a caudal ESI on 06/22/2020 that patient reports it relieved 30% of the pain and it still providing relief. Patients daughter reports that she noticed her mom had increased weakness in bilateral legs following after the injections, but patient reports it helped her leg symptoms. Patient reports urinary incontinence that has progressively gotten worse over the last 2 months. Patient has chronic incontinence of stools. Patient is now living in an assistant living home since 06/19/2020 and seeing will [facility] home health to get PT. Patient believes the PT is helpful, unable to ambulate to bathroom on her own. Patient relies on the use of her wheelchair or the Rolator. No falls since 04/2020. Patient was able to recently increase gabapentin dose to 300mg  a day.      06/22/2020: PROCEDURE: Caudal Epidural Steroid Injection    05/30/20: Follows-up for ongoing back pain, and a laundry list of questions. Goal is to do anything possible to help her QOL and function, as well as get a better understanding of the cause of her worsening symptoms.   She follows up given significant changes over the past 5 months. Her daughter (PT?) reports that her right foot drop has gotten worse, can barely dorsiflex at all. Given her knee issues, her gait is altered and her glutes are atrophied and her piriformis is strained. Her knees are also getting worse, has had bone on bone arthritis for years. They want to know if there is anything we can do for the foot drop, and if not, if we can do anything for the knees.   Over the past week, her pain and altered biomechanics. Cause her to fatigue to more easily. Its hard for her to even get her right leg into proper position for standing/walking given decreased sensorimotor input.  Also worsening scoliosis.   Also having worsening pain in the ielocecal valve, they note concern about the Auberch plexus being compressed. Constant constipation, drinks Peptobismal or Mylanta regularly.  Has been diagnosed with SIBO. Has preconsciousmess of her ability to know when she has to have a BM, but remains continent of bladder, though has  retention issues.   Only taking Tylenol 650mg  TID and lidocaine patches on back/piriformis, arnica, and CBD oils.   Had knee injection in the past with modest relief. Was told she would be a candidate for a knee surgery, but she was not sure her other medical health would allow it.  Her right knee bothers her more than left. However, they wanted to focus on spine before the knee.   Neck pain, hands get tingling, was told she was not a candidate for neck surgery at [facility]. They were also seeing her for the back, but they felt she maxxed out the amount of steroid injections she could have for the back, but had found these very helpful in the past. She felt it helped with strength as well as pain, would last for several months at a time (would get 3-4 per year). They also noted that surgery would requite multiple levels of decompression and may cause instability and worsening scoliosis, so may need fusion (same opinion with Dr. Kevan Rosebush in Dec. 2019). Had also done chiropractic work on the back in the past.   Never had any injections for the neck.   Also having RLS, does not tolerate gabapentin, nor requip. Wish to avoid TCAs given her constipation. Just started sertraline.     10/16/2018: She is here for follow-up of her back pain. She states she continues to have multiple areas of pain, including low back and right buttocks. Her pain sometimes wakes her at night, although she also describes awakening due to needing to urinate. No new weakness, bowel/bladder changes, saddle anesthesia. No recent falls. Feels very fatigued lately; however, she is managed for her thyroid misdiagnosis with Endocrinology in [place]. She is also followed by her PCP. She has tried lidocaine patches, aspercreme, EMG unit without good relief. She sees her chiropractor once per month, but would like to know other ways she can receive care for her pain. She has HH come to her home for an evaluation, but has not heard back from them. This is a different agency than [place]; however, she does state she has had phone difficulties since she does not answer her phone routinely out of fear for fraud issues. She states she saw [facility] Neurosurgery and was told she is not a good candidate because she likely cannot tolerate the rehab. She has multiple surgical questions including what he meant by this statement. She also feels she needs new equipment. She states she wears a knee brace but it does not allow for knee flexion. She states it does have hinges. She did not bring today. She also has a few AFOs, one being custom, but does not like it due to restriction with being able to walk uphill. Today she has her OTC solid AFO.     07/31/18: Missed her appointment this morning - showed up after her appointment time ended. She was able to be seen.   Follows-up after seeing NSG and an outside spine provider who ordered MRI of her C-spine. She also recently saw PCP where she voiced desire to switch to home PT.  She drove to today's visit, but believe's she would benefit from home assessment since that's where most of her issues and falls have been - notes difficulty with ADLs at home and wants home assessment.   She reports that the [facility] and [facility] did not believe she would benefit from surgery. The note indicated from [facility] that it would be her decision. Dr Ova Freshwater also ordered the  MRI of the neck and wanted to see her back after that. She has not made that appointment. She is concerned with ongoing functioning of her hands. She reports some stiffness and arthritis in her hands, which I discussed is not coming from her neck. She does note some weakness and having a harder time opening jars. Today, she reports pain from 4-9/10, today 6/10.  She does not wish to discussed her experience with [facility] NSG, noting she did not like what they had to say since some of her friends her age were able to have surgery. She again notes spinal traction and ESI - most recently provided 1 night of better sleep.    More curt today, wants to to discuss neck, shoulders, arms, hands, low back, knee, and foot drop. Notes generalized deconditioning and goal of increased global function. No clear central issue that she believes is decreasing her desire level of activity, an no clear plan to achieve it. Does request acupuncture, a chance for surgery, and continue therapy. ROS is again near pan-positive.     06/05/18:   She is here to review EMG and discuss next steps in management. Had traction yesterday and states she's the tallest today that she has been in years. Continues with chiropractor. Knee feels more stable in hinged knee brace. Still does not like articulated AFO as she feels she gets less push off while walking. Today, using soft plastic AFO, SPC, Rollater, and hinged right knee brace. Asking about pool therapy. Asking about use of knee brace in the pool.   Rates her pain as 5/10.     ROS notable for constipation, anxiety, joint pain, diarrhea, muscle pain, ithcing, weakness, irritability, leg cramps, skin problems, sleep problems, numbness/tingling. Denies bowel/bladd incontinence.     05/08/18:   Patient recently saw [facility] on 05/01/18 & had ESI performed with significantly helped with her pain & spasms. She also saw her chiropractor on Thursday 9/12 for traction and had spinal injection performed on Friday.    She does bring her spring-leaf AFO & her articulated AFO for right foot present today in clinic.  She still wears them sparingly and does not feel as though it helps with her ankle movement.    She is proud of herself because she went to visit her sister in [place] was able to traverse 29 stairs without significant pain in her right knee.  She has been wearing her hinged knee brace.     She continues to ambulate using 2 single-point canes and furniture walk when within the household.  She uses a Rollator when out in the community so that she can take rest breaks.  She asked questions today about [place] crutches and need for compression stockings.    She had MRI performed on 04/28/2018, report listed below.  She would like to discuss findings today in clinic. She reports ongoing/worsening foot drop and spasms that are relieved with BM.     We reviewed past medical records today.     Previously   80 year old woman with osteopenia, right knee OA, right foot drop & lumbar radiculopathy previously seen by [facility] with multiple ESI's performed here for evaluation of right leg weakness.   ??  She feels as though her right knee gives out on her and sometimes hyperextends when she walks her dog. She is s/p genicular nerve block in April 2019 with [facility].  She has been seeing a chiropractor who recommended using an hinged knee  brace, however patient feels as though it makes it difficult to walk. She also has a knee sleeve for her right knee which she prefers to use.  She reports that her right knee has been bone-on-bone since 1995.  She has occasionally uses Aspercreme with some relief.  ??  She also uses electromagnetic stimulation and an incline table with some relief.  ??  Most recent MRI of her spine was 2013 which revealed severe spinal stenosis & degenerative changes with focal right paracentral disk protrusion at L5-S1. She saw Dr. Ephriam Jenkins of [facility] who thought instrumented fusion??from L2-S1 would be the surgical treatment however her bone health (osteopenia) may make surgery a less preferred option.  ??  She has AFO but only wears it at night because it limits her mobility.  The foot drop reportedly started back in June 2013.    This visit does not involve [employer].    Past History:    The following portions of the patient's history were reviewed in the electronic medical record and updated as appropriate:  Allergies  Current medications  Past medical history  Problem list                 Review of Systems:   All systems reviewed and negative other than as noted in the HPI.     OBJECTIVE:     LMP  (LMP Unknown)     Physical Exam:   GEN: No acute distress  HEENT: Atraumatic, normocephalic   CV: Extremities warm and well-perfused  RESP: Non-dyspneic on room air  GI: Abd non-distended  SKIN:No lesions appreciated.   PSYCH: Affect appropriate  MSK/NEURO:  08/22/2020: No physical exam completed today as this was a virtual visit.    05/30/20: Presents in [facility] today. Was uncomfortable in sitting so was transferred to low exam table and had to lay on side most of the visit. Exam limited by positioning and pain, has a hard time even sitting up now, so provacative exam limited. Does have TTP over sacrum and lumbar spine and paraspinals, including midline but no focal pain. MMT with worsening weakness in RLE, extent of which is difficult to note as it is also pain limited but, she remains 0/5 with EHL and at least 1/5 with ADF, 2/5 ankle inversion/eversion.   Previous:  MMT 5/5 in b/l UE with symmetric trace DTRs and negative hoffmans, with intact sensation to light touch. Does have crepitus in her shoulders and OA of the hands.   Previous: Right ADF still 2/5 - relies primarily on ankle everters rather than TA. EHL 0/5. APF and ankle inversion 4/5 today.   Previous: Motor   LUE 5/5 shoulder abductor, 5/5 elbow flexor, 5/5 elbow extender 5/5 wrist extensor, 5/5 wrist flexor, 5/5 finger abductor  RUE 5/5 shoulder abductor, 5/5 elbow flexor, 5/5 elbow extender 5/5 wrist extensor, 5/5 wrist flexor, 5/5 finger abductor  LLE 5/5 hip flexor, 5/5 knee extensor, 5/5 ankle dorsiflexor, 5/5 long toe extensor, 5/5 ankle plantar flexor  RLE 5/5 hip flexor, 5/5 knee extensor, 2/5 ankle dorsiflexor (ankle everts when she attempts to dorsiflex), 1/5 long toe extensor, 4/5 ankle plantar flexor  -- Lumbar flexion to 160' able to place palms on floor without knee flexion  -- No tenderness to palpation throughout midline lumbar spine or lumbar paraspinals  -- No pain with axial loading, or lateral rotation or lumbar extension  -- FABER & FADIR negative bilaterally  Gait and Station: significant hip hike on right to toe  clearance of right foot   Reflexes  LLE: 1+ patella, 1+ Achilles  RLE: 1+ patella, 1+ Achilles  Hoffman's: - (negative) LUE, - (negative) RUE         Diagnostic Studies:       EXAM: Magnetic resonance imaging, brain without and with contrast material.  DATE: 01/29/2020 3:00 AM  ACCESSION: [id number]  DICTATED: 01/29/2020 3:16 AM  INTERPRETATION LOCATION: [facility]  ??  CLINICAL INDICATION: 80 years old Female with Concern for pituitary adenoma ; Transient ischemic attack (TIA)    ??  COMPARISON: CT head dated 01/28/2020.  ??  TECHNIQUE: Multiplanar, multisequence MR imaging of the brain was performed without and with I.V. contrast. Imaging of the pituitary gland including dynamic and thin section coronal and sagittal images was also performed.  ??  FINDINGS:  There is no enlargement of the pituitary gland or sella. There is no intrasellar or suprasellar mass. No abnormal enhancement. The pituitary stalk is midline. The optic chiasm is normal. 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  ??  Ventricles are normal in size. There is no midline shift. No extra-axial fluid collection. No evidence of intracranial hemorrhage. No diffusion weighted signal abnormality to suggest acute infarct. No cerebral or cerebellar mass.  ??  IMPRESSION:  -- No acute intracranial abnormality. No pituitary adenoma as clinically questioned.  ??  ====================  ADDENDUM (01/29/2020 9:48 AM):   Attending note:  -There is mild motion artifact during the dynamic scan, which could decrease the sensitivity of detecting small microadenoma. Overall, there is no evidence of macroadenoma.      MRI Cervical Spine (07/26/18): Per Dr. 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 ??No immediate patient complications or events noted.    FINDINGS:  Alignment: Straightening of the normal cervical lordosis. Grade 1  anterolisthesis C4 on C5, C5 on C6, C7 on T1, T2 on T3.   Spinal Cord: The visualized cord is normal in caliber and signal.   Marrow Signal: Normal.  Epidural Hematoma: None.  Vertebral Body Heights: Symmetric height loss most pronounced C6 with  additional, less severe findings at C5 and C7.  Intervertebral Discs: Multilevel disc space height loss most pronounced  C6-T1 and C3-C4.    Paraspinal Soft Tissues: Unremarkable.  Neck Soft Tissues: Unremarkable.    C1-C2: Degenerative changes about the atlantodental interval with  hypertrophy of the transverse craniocervical ligament.  C2-C3: No significant neuroforaminal narrowing. Minimal disc protrusion. No  spinal canal stenosis.  C3-C4: Facet arthropathy resulting in moderate to severe bilateral  neuroforaminal narrowing. Circumferential disc bulge and buckling of the  ligamentum flavum which results in no significant spinal canal stenosis.  C4-C5: Facet arthropathy results in moderate to severe bilateral  neuroforaminal narrowing. Circumferential disc bulge with buckling of the  ligamentum flavum results in mass effect on the ventral spinal cord and no  significant spinal canal stenosis.  C5-C6: Facet arthropathy resulting in moderate to severe bilateral neural  foraminal narrowing. Circumferential disc bulge and buckling of the  ligamentum flavum results in indentation of the ventral spinal cord and  mild  spinal canal stenosis.  C6-C7: Facet arthropathy resulting in moderate to severe bilateral  neuroforaminal narrowing. Circumferential disc bulge which which abuts and  indents the ventral spinal cord without significant spinal canal stenosis.  C7-T1: Facet arthropathy resulting in moderate bilateral neuroforaminal  narrowing. Circumferential disc bulge and buckling of the ligamentum flavum  results in mild spinal canal stenosis.  T1-T2: Facet arthropathy resulting in moderate neural foraminal narrowing.  Small disc protrusion. No spinal canal stenosis.  T2-T3: Mild narrowing of the right neural foramen. No spinal canal  stenosis.  T3-T4: No neuroforaminal narrowing. Mild disc bulge. No spinal canal  stenosis.    IMPRESSION:  1. 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  2. ??Moderate to severe bilateral neuroforaminal narrowing within the  cervical spine.    The preliminary report (critical or emergent communication) was reviewed  prior to this dictation and there are no substantial differences between  the preliminary results and the impressions in this final report.      XR Entire Spine Thoracic and Lumbar  07/21/18:   Radiographs of the entire thoracic and lumbar spine - 2 views, 6 images.     Indication: M54.9 Dorsalgia, unspecified.     Comparison: Lumbar spine MRI 04/28/2018.    Findings:   Frontal and lateral images of the thoracolumbar spine were acquired; upper  and lower images were appropriately fused.    Subcentimeter anterolisthesis at the cervicothoracic junction. Diffuse  multilevel degenerative disc disease throughout the spine, greatest in the  lower cervical and lumbar spine. Lumbar dextrocurvature, thoracolumbar  junction levocurvature, and midthoracic dextrocurvature. Mild bilateral hip  joint space narrowing. Moderate to severe left glenohumeral joint space  loss and marginal osteophytosis. Straightening of the thoracolumbar  lordosis.    Clear lungs. Aortobiiliac atherosclerotic calcifications. Moderate  functional without evidence for obstruction. Pelvic phleboliths.    Impression:   Scoliosis and spondylosis.    Please note that this examination is designed to assess spinal alignment;  if detailed evaluation of the cervical, thoracic, and/or lumbar spine is  desired, consider dedicated cervical spine, thoracic spine, and/or lumbar  spine radiographic studies.    EMG 05/27/18:    CONCLUSION   Abnormal study. There is electrodiagnostic evidence of chronic right L5   radiculopathy. These findings have worsened since prior study performed in   2013. Clinical correlation advised.     IMAGING:  MRI lumbar spine 04/28/2018    For the purposes of this dictation, the lowest well formed intervertebral disc space is assumed to be the L5-S1 level, and there are presumed to be five lumbar-type vertebral bodies.    Mild straightening of normal lumbar lordosis with dextrocurvature of the lower lumbar spine, similar in appearance when compared to prior. Grade 1 left lateral listhesis of L1 on L2 and grade 1 right lateral listhesis of L3 on L4. Minimal stepwise grade 1 anterolisthesis of T12 on L1 and L1 on L2.    Multilevel degenerative changes of the spine as evidenced by anterior endplate osteophytosis, endplate signal abnormalities, and intervertebral disc space narrowing.    No abnormal signal abnormality within the spinal cord. The conus medullaris ends at a normal level.    At T12-L1, there is asymmetric disc bulging, ligamentum flavum hypertrophy and facet arthropathy resulting in mild right-sided neural foraminal and spinal canal narrowing. The left neural foramen is patent.    At L1-L2, there is diffuse disc bulging and facet arthropathy, resulting in moderate central canal narrowing. There is mild right neural foraminal  stenosis    At L2-L3, there is diffuse disc bulging, ligamentum flavum hypertrophy, and facet arthropathy results in severe central canal narrowing and moderate neural foraminal narrowing on the left. The right neural foramen is patent.    At L3-L4, there is diffuse disc bulging, ligamentum flavum hypertrophy, and facet arthropathy, resulting in severe central canal narrowing and moderate to severe left neural foraminal narrowing.    At L4-L5, there is asymmetrical disc bulging involving primarily the subarticular zone on the right, resulting in severe right and mild left neural foraminal narrowing. Hypertrophic ligamentum flavum with facet arthropathy, with associated severe spinal canal narrowing.    At L5-S1, there is diffuse disc bulging, hypertrophic ligamentum flavum, and facet arthropathy resulting in moderate spinal canal narrowing and mild-to-moderate neural foraminal narrowing bilaterally.      Asa Saunas, MD  Assistant Professor - PM&R  Interventional Spine Specialist - [facility]  Staff Physiatrist [facility]   [facility] [employer] [facility] [employer]

## 2020-08-24 ENCOUNTER — Ambulatory Visit
Admit: 2020-08-24 | Discharge: 2020-08-25 | Payer: MEDICARE | Attending: Psychiatric/Mental Health | Primary: Psychiatric/Mental Health

## 2020-08-24 DIAGNOSIS — F4322 Adjustment disorder with anxiety: Principal | ICD-10-CM

## 2020-08-24 DIAGNOSIS — F259 Schizoaffective disorder, unspecified: Principal | ICD-10-CM

## 2020-08-24 MED ORDER — SERTRALINE 50 MG TABLET
ORAL_TABLET | Freq: Every day | ORAL | 0 refills | 90 days | Status: CP
Start: 2020-08-24 — End: 2020-11-22

## 2020-08-24 MED ADMIN — paliperidone palmitate (INVEGA SUSTENNA) 156 mg/mL injection 156 mg: 156 mg | INTRAMUSCULAR | @ 16:00:00

## 2020-08-24 NOTE — Unmapped (Signed)
Please let me know if you'd like to continue on the same dose. If so I can refill it, but since there's a questions about the dose, I'd like for you to take a look and advise.

## 2020-08-24 NOTE — Unmapped (Signed)
Pharmacy contacted the clinic on behalf of the patient. Pharmacy verified. Cindy [name]

## 2020-08-24 NOTE — Unmapped (Signed)
Follow-up instructions:  -- Please continue taking your medications as prescribed for your mental health.   -- Do not make changes to your medications, including taking more or less than prescribed, unless under the supervision of your physician. Be aware that some medications may make you feel worse if abruptly stopped  -- Please refrain from using illicit substances, as these can affect your mood and could cause anxiety or other concerning symptoms.   -- Seek further medical care for any increase in symptoms or new symptoms such as thoughts of wanting to hurt yourself or hurt others.     Contact info:  Life-threatening emergencies: call 911 or go to the nearest ER for medical or psychiatric attention.     Issues that need urgent attention but are not life threatening: call the clinic at [phone].    Non-urgent routine concerns, questions, and refill requests: please call the clinic for assistance.     Regarding appointments:  - If you need to cancel your appointment, we ask that you call [phone] at least 24 hours before your scheduled appointment.  - If for any reason you arrive 15 minutes later than your scheduled appointment time, you may not be seen and your visit may be rescheduled.  - Please remember that we will not automatically reschedule missed appointments.  - If you miss two (2) appointments without letting us know at least 24 hours in advance, you will likely be referred to a provider in your community.  - We will do our best to be on time. Sometimes an emergency will arise that might cause your clinician to be late. We will try to inform you of this when you check in for your appointment. If you wait more than 15 minutes past your appointment time without such notice, please speak with the front desk staff.    In the event of bad weather, the clinic staff will attempt to contact you, should your appointment need to be rescheduled. Additionally, you can call the Patient Weather Line [phone] for system-wide clinic status    For more information and reminders regarding clinic policies (these were provided when you were admitted to the clinic), please ask the front desk.    Earon Rivest, MPH, PMHNP-BC  Department of Psychiatry  [facility]  [address]  [address]  [place], [place] [address]  Phone: [phone]

## 2020-08-24 NOTE — Unmapped (Signed)
[facility]  Psychiatry   Established Patient E&M Service - Outpatient       Assessment:    CATINA NUSS presents for follow-up evaluation.     Identifying Information:  BHAVANA KADY is a 80 y.o. female with a history of PPD, schizoaffective disorder and hyperthyroidism who was admitted involuntarily to the [facility] unit 6/18- 03/01/2020, who presents for evaluation of mood, medication and to establish care. . Endorsing depression related to recent decline of physical abilities. This is Ms. Farewell first outing in a wheel chair. Today, Ms. Sorce is endorsing anhedonia, low motivation and mood, and sialorrhea that started after receiving [place] injection.    Risk Assessment:  A full psychiatric risk assessment was conducted on 03/16/2020 and risks do not appear significantly changed from that visit.   While future psychiatric events cannot be accurately predicted, the patient does not currently require acute inpatient psychiatric care and does not currently meet [facility] involuntary commitment criteria.      Plan:    Problem: Schizoaffective disorder  Status of problem: chronic and stable  Interventions: Continue Invega Sustenna  156 mg q 28 days. Last injection   Discussed possible risk of side effects of Invega including hyperglycemia, NMS, seizures, EPS, hyperprolactinemia, diabetes, dyslipidemia, tardive dyskinesia, sedation, hyper salivation, orthostatic hypotension, tachycardia, injection site reactions.     Problem: Adjustment disorder  Status of problem:  new problem to this provider  Interventions: Recently made appt with new therapist. Pt recently experienced significant change in mobility and independence. Also connected to PT that is helping with independence.       Problem: Sialorrhea  Status of problem: improved or improving  Interventions: continue ipratropium bromide SL 1-2 sprays up to 4 x per day  Discussed possible side effects of ipratropium bromide including hypersensitivity reactions, bronchospasm, bladder neck obstruction, intraocular pressure, prostatic hyperplasia, urinary retention, abnormal taste in mouth, MI, dry nasal mucosa, sinusitis, CVA    Problem: Depression  Status of problem:  not improving as expected  Interventions: - continue sertraline 50 mg daily Discussed possible risk of side effects of sertraline including seizure, induction of mania, activation of SI, sexual dysfunction, GI, sedation, HA dizziness, hyponatremia, serotonin syndrome.      Problem: r/o Mild neurocognitive impairment  Status of problem:  new problem to this provider and not improving as expected  Interventions: MOCA performed 02/11/20 score:??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 Will repeat at future follow up to reevaluate.     Problem: Insomina  Status of problem:  chronic and stable  Interventions: - continue melatonin 3 mg OTC  -may also take OTC benadryl prn. Aware of risk of anticholinergic side effects.     Problem: Hx of medication side effects  Status of problem:  new problem to this provider  Interventions:Genesight test 03/16/2020     Risks/benefits and indications for treatment with medications above were discussed with the patient. The patient asked appropriate questions, acknowledged understanding of answers, and provided informed consent to initiation&??continuation of medications above.??  ??  Return to clinic appointment: in 3-4 weeks or earlier as needed by patient         Psychotherapy provided:  No billable psychotherapy service provided but brief supportive therapy was utilized.    Patient has been given this writer's contact information as well as the [facility] Psychiatry urgent line number. The patient has been instructed to call 911 for emergencies.      Subjective:    Interval History:  Ronnae reports things have been tense. Trying to get a lot done in a short time. Working on establishing a routine for medication. Frustrated by the amount of assistance needed for basic needs. Afriad of upcoming cataracts surgery. Less eye contact today. Discussing a MILD surgery (minimally invasive lumbar decompression) with the pain clinic to help with progressive leg weakness. Afraid of falling out of wheel chair. PT has encouraged leg marches. Also scared of most recent COVID variant. Frustrated by the amount of support needed from daughter, [name]. Now receiving higher level of support (level 4).     Past medication history: Fluoxetine (not effective), tegretol (anaphylaxis), lithium (dystonia), paliperidone (hypersalivation)       Objective:    Mental Status Exam:  Appearance:    Appears stated age and Clean/Neat   Motor:   No abnormal movements   Speech/Language:    Normal rate, volume, tone, fluency   Mood:   Depressed   Affect:   Depressed   Thought process and Associations:   Logical, linear, clear, coherent, goal directed   Abnormal/psychotic thought content:     Denies SI, HI, self harm, delusions, obsessions, paranoid ideation, or ideas of reference   Perceptual disturbances:     Denies auditory and visual hallucinations, behavior not concerning for response to internal stimuli     Other:            Visit was completed face to face.      [name], PMHNP

## 2020-08-24 NOTE — Unmapped (Signed)
Administered long-acting injectable today, 08/24/2020. See MAR for additional documentation. Cindy [name], CMA

## 2020-08-29 NOTE — Unmapped (Signed)
Voicemail left informing patient on cancellation of elective surgeries at [facility] until further notice due to COVID related issues.  Encouraged to call surgery schedulers with any questions.    Claris Pong, MD/MPH  Oculofacial Plastic & Reconstructive Surgery

## 2020-08-30 MED FILL — INVEGA SUSTENNA 156 MG/ML INTRAMUSCULAR SYRINGE: INTRAMUSCULAR | 28 days supply | Qty: 1 | Fill #5

## 2020-09-01 ENCOUNTER — Telehealth
Admit: 2020-09-01 | Discharge: 2020-09-02 | Payer: MEDICARE | Attending: Addiction (Substance Use Disorder) | Primary: Addiction (Substance Use Disorder)

## 2020-09-01 DIAGNOSIS — F250 Schizoaffective disorder, bipolar type: Principal | ICD-10-CM

## 2020-09-01 DIAGNOSIS — F4310 Post-traumatic stress disorder, unspecified: Principal | ICD-10-CM

## 2020-09-01 MED ORDER — GABAPENTIN 300 MG CAPSULE
ORAL_CAPSULE | 2 refills | 0 days | Status: CP
Start: 2020-09-01 — End: ?

## 2020-09-01 NOTE — Unmapped (Signed)
I'm not sure what orders they're looking for, other than the gabapentin refill that I previously sent a msg. about. Per this msg., you'll need to sign everything that gets sent to them.
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
## 2020-09-06 ENCOUNTER — Ambulatory Visit: Admit: 2020-09-06 | Discharge: 2020-09-07 | Payer: MEDICARE | Attending: Anesthesiology | Primary: Anesthesiology

## 2020-09-06 DIAGNOSIS — M48061 Spinal stenosis, lumbar region without neurogenic claudication: Principal | ICD-10-CM

## 2020-09-06 MED ORDER — DICLOFENAC 1 % TOPICAL GEL
Freq: Four times a day (QID) | TOPICAL | 1 refills | 13 days | Status: CP
Start: 2020-09-06 — End: 2020-10-06

## 2020-09-06 NOTE — Unmapped (Signed)
[facility] Department of Anesthesiology and Pain Medicine    Date: September 06, 2020  Patient Name: Cindy [name]  MRN: [id number]  PCP: [facility]  Referring Provider: Wyatt Haste, FNP    ASSESSMENT:  Cindy [name] is a 80 y.o. female. She with a past medical history significant for schizoaffective disorder, severe spinal stenosis, scoliosis, severe degenerative changes is being [facility] in consultation for an evaluation for candidacy for minimally invasive lumbar decompression (MILD).     1. Multilevel severe spinal stenosis, scoliosis, severe degenerative disease. The patient has severe multilevel spinal stenosis, severe dextroscoliosis, and severe degenerative disease throughout the lumbar spine that are likely contributing to her symptoms.  Over the past 6 months the patient has significant decline in her function and has been primarily using a wheelchair and a Rollator walker for ambulation.  She notes significant reduction in proprioception throughout bilateral lower extremities, and uses an AFO on the right to treat a foot drop.  She has recently seen neurosurgery, who did not recommend any surgical intervention.  She was referred to our clinic for consideration of minimally invasive lumbar decompression (MILD).  Based on history, mostly obtained through the patient's daughter,  the patient does not have classic symptoms of neurogenic claudication, and has had progression of neurological symptoms (lack of proprioception) over past 6 months which have contributed to her need for a wheelchair and dependence on a rollator for walking. She does very little walking with the rollator.  Based on review of the imaging (MRI and plain films), the patient has significant dextroscoliosis, multilevel severe spinal stenosis, and degenerative disease which would prevent [place] from identifing the interlaminar space and safely docking the instruments to perform the MILD procedure. Because of the severity of the disease at every level of the lumbar spine, severity of the scoliosis,  I do not think that the MILD procedure would be an option, and would be unlikely to improve the patient's symptoms. She will continue conservative measures to manage her symptoms with Dr. Oneida Arenas.    2. 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  The patient complains of bilateral hand pain that is associated with osteoarthritis.  She notes that placing Voltaren gel on bilateral hands improves her symptoms, and requests that we provide a one-time prescription for Voltaren gel to be placed on bilateral hands at her assisted living facility.  I provided a prescription, and also filled out paperwork for the assisted living facility.  She currently places Voltaren gel on other locations including her knee, and I requested that they maintain a dose not to exceed 8 g in a day (2 g in each application).  With her primary care physician who makes a house call at her assisted living facility for future prescriptions and management of her osteoarthritic pain.      PLAN:  ?? We will prescribe Volaren gel to address osteoarthritis pain in her bilateral hands. She needs a one time prescription for this and plans to follow-up with her PCP. I also counseled not to exceed 8 g per day (2g per application) across all the body areas this is applied. Patient/daughter expressed an understanding.   ?? We had a detailed risk/benefit discussion, and reviewed MRI/plain film imaging with the patient and her daughter. Given the multilevel severe stenosis, and significant dextroscoliosis I do not believe that we could dock our instruments to perform the procedure safetly.  Because of the multilevel disease, we would not be able  to identify a target level to perform the procedure.   No orders of the defined types were placed in this encounter.    Requested Prescriptions      No prescriptions requested or ordered in this encounter SUBJECTIVE:    History of Present Illness:  Cindy [name] is seen in consultation at the request of Wyatt Haste, FNP for consideration of the MILD procedure to address symptoms of severe spinal stenosis.    The patient reports that her chief complaint is lack of independence and lack of ability to perform tasks. She was admitted to the hospital this past summer and she has experienced progressive foot drop worsened in her right leg and worsening ability to identify where her legs are in space. Over the course of several months she has been unable to stand and move, and has been more reliant on a wheel chair. She has lost proprioception (ability to sense where her legs are).     It was challenging to obtain a full history from the patient and therefore the patient's daughter provided much of the history. Patient was admitted to the hospital this summer with psychosis, and the patient does have a history of mania and the daughter stated that this episode this summer was similar to this. Before the hospitalization she had facial droop and her daughter brought her to the hospital for evaluation of stroke. There was no evidence on CT of a stroke. She did develop psychosis after this evaluation and she was admitted. She was hearing fireworks in her head and having hallucinations. She was also having disordered thinking. Ultimately was given the diagnosis of schizoaffective disorder. She was discharged in July and the day she was released from the hospital she began having a leg drop on the right according to the daughter and was given a brace. She then developed worsening of her strength over time. She had an epidural injection which enabled her to move more. She has had intermittent bowel incontinence, which she attributes to a loss of sensation. She has noted urinary incontinence intermittently. Both have been present for some time, because of her IBS, but since October she has had a difficult time with bowel movements. The last time the patient walked was yesterday using a rollator, but she is so weak that she does not make it very far as she does not have proprioception in her legs. She described the inability to perceive where her legs were in space. She states that her right leg is worse, and she feels like she is skiing. She has restless leg syndrome that disrupts her sleep, and has been a long standing problem for her. She feels spasms in her legs that occur intermittently. Anxiety makes her pain worse. The patient reported that she is worried about asking for too much help given they are short staffed at the assisted living facility. She at times feels unsafe as she is unable to reach the buzzer at her assisted living residence, and reports worry about being left on the toilet for too long. She is working with her daughter to improve the level of care she is getting. The patient also complained of pain in her hands from arthritis. She would like to try voltaren gel on her hands, but has struggled to get the proper prescription. To address her pain she has had multimodal treatments, but she is still experiencing a decline in function. For the past 6 months she has been using a wheel chair or a rollator  to facilitate ambulation. She has been seen by neurosurgery to consider surgical decompression but the patient was not felt to be a good candidate for surgery. She has been manages conservatively, and presents to day to inquire about whether she would be a candidate for the MILD procedure. We had a detailed discussion about the procedure and reviewed the MRI/plain film imaging in detail.       Current view: Showing all answers Show Only Relevant Answers    Legend:         Triggered a BPA  Scoring question                     [facility] New Patient Questionnaire     Question 09/05/2020  4:52 PM EST - Filed by Patient    Primary Care Physician Name: Dr Charlestine Massed    Referring Physician Name: Oneida Arenas    Reason for today's visit: eval for possible MILD surgery    Date of onset of your pain: 08/19/2007    What started your pain? It's been progressive DDD    What questions would you like to ask your doctor today? Am I a candidate for this surgery? What ar pros and cons? Success probability? How long would what symptoms be resolved? What expectations should I have?    Please circle the location of your pain.  Drawing on 09/05/2020 at  4:42 PM EST      Please select the words that describe your pain. Aching     Burning     Dull     Pressing     Sharp     shooting     Sore     Stabbing Tender Throbbing     Other    Please rate your pain at its WORST in the past month. 7    Please rate your pain at its LEAST in the past month. 4    Please rate your pain on AVERAGE in the past month. 5    How did your pain start? Over time    Do you have any of the following? Numbness     Weakness     Tingling     Change in skin sensitivity     Stiffness     Swelling    Have you had any loss of bowel of bladder control? Yes    What worsens your problem? Exercise     Sitting/Laying down     Standing     Walking    How often do you have pain? Sometimes    What helps your pain? Medication     Massage     Injections     Heat/Ice     Other    Have you tried any of the following for your pain? Physical Therapy     Water Therapy     TENS Unit     Medication     Epidural Injections     Nerve Block Injections     Chiropractic Treatments     Acupuncture     Psychological Counseling    Have you had any of the following tests for your current problem within the past 7 years? MRI     X-Rays     CT Scan     Nerve Conduction Study    Will your visit involve Workman's Compensation? No    If this is the result of an accident/injury, are you involved in a lawsuit  or have a lawyer? No    Please select the phrase(s) which best reflects your treatment goals. Improvement of my pain without complete resolution    Is there anything else we should know about your pain, goals, or your feelings about pain management? goals relvolve around getting stronger and greater independence    What medications have you tried in the past for your pain? tlenol, ibuprofen, ketoprofen, gaba, lidocaine, voltaren    Please list all current medications you are taking: see chart    General: Weight Loss/Gain     Fatigue     Difficulty Sleeping     Daytime Drowsiness    Head, Ears, Eyes, Nose and Throat: Hearing Problems     Nasal Congestion/Discharge     Ringing in the Ears     Problems Swallowing    Skin: Rash    Pulmonary - (Lungs):       Endocrine (Hormonal System): Excess Thirst     Excess Urination     Thinning hair     Intolerance to heat or cold    Gastrointestinal - (Intestinal): Stomach Pain     Diarrhea     Constipation    Genitourinary - (Kidneys and Bladder): Frequent Urination     Difficulty Controlling Urine    Musculoskeletal System - (Muscles, Joints and Coverings): Joint Aches/Swelling     Spasms/Spasticity/Cramps     Back pain     Muscle Aches/Weakness     Muscle Wasting    Cardiovascular: Swelling of Ankles/Legs    Neurologic: Tremors     Excessive Sleeping     Coordination Difficulty     Numbness/Tingling    Psychiatric: Depression     Anxiety     Low Concentration     Low Energy     Lack of interest in activities     Isolating Yourself    Blood System: Easy Bruising    Immune System: Allergies     Rash    Social History:     What is your marital status? Divorced    How many children do you have? daughter, 23    What are your living arrangements? Living with other    What is your highest level of education? Masters/Graduate Degree    What is your employment status? 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 pt, and group exercise video in morning    How often do you exercise? 4 days/week    Have you ever used tobacco products? Yes    How often do you drink alcohol? not anymore    Have you ever used recreational drugs? no Have you ever been in treatment for any kind of substance abuse (alcohol or any kind of drugs)?   no    Do you currently use recreational drugs?  no    Do you currently use another person's prescription medications? no    Have you ever been in treatment for drug or alcohol dependency? No      [facility] Patient-Entered [facility] Selection Questionnaire     Question 09/05/2020  4:56 PM EST - Filed by Patient    What is the primary reason for your visit? Neurological Problem    Are you having any of these problems?     Confusion or odd behavior       Clumsiness       Loss of feeling in a part of your body Yes    Weakness in  a part of your body Yes    Loss of balance Yes    Memory problems       Nearly fainting       Slurred speech       Fainting       Vision change       Weakness       Your problem is a... Chronic problem    When did you first notice this problem? More than 1 month ago    How would you describe the start of your neurological problem? Gradually    Since you first noticed this problem, how has it changed? Gradually worsening    In which part of your body have you noticed this problem? Legs    Are you experiencing any of the following symptoms?     Abdominal pain Yes    Hearing changes Yes    An unusual sensation (light, sound, odor) before headache or seizure No    Back pain Yes    Bladder incontinence Yes    Bowel incontinence Yes    Chest pain No    Confusion Yes    Sweating much more than normal No    Dizziness No    Fatigue Yes    Fever No    Headaches No    Light-headedness No    Nausea No    Neck pain No    Pounding in the chest No    Shortness of breath No    Feeling like the room is spinning No    Vomiting No    Which of the following treatments have you tried? Acetaminophen (Tylenol)     Bed rest     Drinking     Eating     Medication     Changing positions     Sleep     Walking    If you've tried a treatment for this problem, how much relief did you experience? Significant            She is getting physical therapy now to improve strength.   She denies any homicidal or suicidal ideation.     Review of Systems:  See survey results above.    Past Medical History:  Past Medical History:   Diagnosis Date   ??? ABMD (anterior basement membrane dystrophy)    ??? Allergic rhinitis    ??? Arthritis    ??? Cataract     Nuclear sclerotic cataract OS, BCVA 20/20    ??? Dry eyes    ??? Fibromyalgia    ??? Food intolerance    ??? GERD (gastroesophageal reflux disease)    ??? History of migraine     With Aura   ??? 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 Ptosis     Intermittent ptosis OD   ??? PVD (posterior vitreous detachment), bilateral    ??? Schizoaffective disorder (CMS-HCC)    ??? Sjogren's syndrome (CMS-HCC)      Past Surgical History:   Procedure Laterality Date   ??? CATARACT EXTRACTION Right 02/08/2013   ??? HERNIA REPAIR      x4   ??? HYSTERECTOMY     ??? SIGMOIDECTOMY     ??? SKIN BIOPSY         Family History:  Family History   Problem Relation Age of Onset   ??? Cancer Mother         OVARIAN   ??? Depression Mother    ???  Ovarian cancer Mother    ??? Thyroid disease Father    ??? Hypertension Father    ??? Depression Father    ??? Arthritis Father    ??? Heart disease Father    ??? Thyroid disease Sister    ??? Hypertension Sister    ??? Hypertension Maternal Grandmother    ??? Stroke Maternal Grandmother    ??? Hyperlipidemia Daughter    ??? No Known Problems Maternal Grandfather    ??? No Known Problems Paternal Grandmother    ??? No Known Problems Paternal Grandfather    ??? No Known Problems Sister    ??? No Known Problems Brother    ??? No Known Problems Maternal Aunt    ??? No Known Problems Maternal Uncle    ??? No Known Problems Paternal Aunt    ??? No Known Problems Paternal Uncle    ??? No Known Problems Other    ??? Alcohol abuse Neg Hx    ??? COPD Neg Hx    ??? Diabetes Neg Hx    ??? Drug abuse Neg Hx    ??? Hearing loss Neg Hx    ??? Vision loss Neg Hx    ??? Learning disabilities Neg Hx    ??? Melanoma Neg Hx    ??? Basal cell carcinoma Neg Hx    ??? Squamous cell carcinoma Neg Hx    ??? Amblyopia Neg Hx    ??? Blindness Neg Hx    ??? Cataracts Neg Hx    ??? Glaucoma Neg Hx    ??? Macular degeneration Neg Hx    ??? Retinal detachment Neg Hx    ??? Strabismus Neg Hx        Social History:   reports that she quit smoking about 27 years ago. She quit after 30.00 years of use. She has never used smokeless tobacco. She reports current alcohol use of about 1.0 standard drink of alcohol per week. She reports previous drug use.     Allergies:  Allergies as of 09/06/2020 - Reviewed 09/06/2020   Allergen Reaction Noted   ??? Carbamazepine Anaphylaxis, Hives, and Other (See Comments) 01/07/2013   ??? Other Hives and Other (See Comments)    ??? Amitiza [lubiprostone] Other (See Comments) 06/02/2014   ??? Aspirin Other (See Comments) 01/07/2013   ??? Ciprofloxacin Other (See Comments) 01/07/2013   ??? Hctz [hydrochlorothiazide]  03/22/2016   ??? Prednisone Other (See Comments) 01/07/2013   ??? Raloxifene Other (See Comments) 05/15/2017   ??? Shellfish containing products  01/07/2013   ??? Wellbutrin [bupropion hcl] Other (See Comments) 04/27/2020   ??? Baclofen Nausea Only 06/02/2014   ??? Bismuth subsalicylate Tinnitus 01/07/2013   ??? Escitalopram Rash 01/07/2013        Current Medications:  Current Outpatient Medications   Medication Sig Dispense Refill   ??? acetaminophen (TYLENOL 8 HOUR) 650 MG CR tablet Take 1,300 mg by mouth every eight (8) hours as needed for pain.     ??? amLODIPine (NORVASC) 5 MG tablet Take 1 tablet (5 mg total) by mouth daily. 90 tablet 3   ??? ASHWAGANDHA ROOT EXTRACT,BULK, MISC Take 2 tablets by mouth two (2) times a day. 800 mg per tablet.     ??? carboxymethylcellulose sodium (THERATEARS) 0.25 % Drop Administer 2 drops to both eyes every two (2) hours as needed. (Patient taking differently: Administer 2 drops to both eyes every two (2) hours as needed (dry eyes).) 10 mL 0   ??? cholecalciferol, vitamin D3-50 mcg, 2,000 unit,,  50 mcg (2,000 unit) tablet Take 1 tablet (50 mcg total) by mouth daily.  0   ??? diclofenac sodium (VOLTAREN) 1 % gel Apply 4 g topically Four (4) times a day. 500 g 3   ??? diphenhydrAMINE (BENADRYL) 25 mg capsule Take 25 mg by mouth nightly. Patient reported medication to help her sleep     ??? docusate sodium (COLACE) 100 MG capsule Take 1 capsule (100 mg total) by mouth daily.  0   ??? gabapentin (NEURONTIN) 100 MG capsule PLEASE SEE ATTACHED FOR DETAILED DIRECTIONS 270 capsule 1   ??? gabapentin (NEURONTIN) 300 MG capsule Take 1-2 pills PO qhs 60 capsule 2   ??? glycerin, adult, Supp Insert 1 suppository into the rectum daily as needed (if bowels are hard.). 15 suppository 1   ??? lidocaine HCL (ASPERCREME, LIDOCAINE HCL,) 4 % lqro Apply 1 application topically Three (3) times a day as needed.  0   ??? melatonin 3 mg Tab Take 3 mg by mouth every evening.     ??? olmesartan (BENICAR) 20 MG tablet Take 1 tablet (20 mg total) by mouth daily. 90 tablet 3   ??? paliperidone palmitate (INVEGA SUSTENNA) 156 mg/mL Syrg Inject 1 mL (156 mg total) into the muscle every twenty-eight (28) days for 12 doses. 1 mL 10   ??? sertraline (ZOLOFT) 50 MG tablet Take 1 tablet (50 mg total) by mouth daily. 90 tablet 0     Current Facility-Administered Medications   Medication Dose Route Frequency Provider Last Rate Last Admin   ??? paliperidone palmitate (INVEGA SUSTENNA) 156 mg/mL injection 156 mg  156 mg Intramuscular Q28 Days Cherene Altes, MD   156 mg at 08/24/20 1112         Previous Imaging/Tests:  06/07/20 MRI of the lumbar spine  FINDINGS:  The visualized cord is unremarkable and the conus medullaris ends at a normal level.  ??  Dextroscoliosis of lumbar spine is noted centered at L2-L3.  ??  Severe disc space narrowing is noted at all levels of the lumbar spine and lower thoracic spine. Facet joint arthropathy is noted at all levels of the lumbar spine.  ??  T12-L1 demonstrates mild disc bulge. Nerve roots are not affected by the disc bulge. Facet joint hypertrophy and hypertrophy of the ligamentum flavum is seen posteriorly with mild indentation on the adjacent posterior lateral aspect of the dural sac. No significant central spinal canal stenosis or neural foraminal stenosis.  ??  L1-2 demonstrates mild disc bulge with hypertrophy of ligamentum flavum and facet joints. Disc bulge is more pronounced along the left posterior aspect of L1-2 and produces minimal posterior displacement of the L2 nerve rootlet. Moderate to severe central spinal canal stenosis is noted. Findings are mildly progressive as compared to preceding exam. There is mild narrowing of the bilateral neural foramina.  ??  L2-3 demonstrates prominent disc bulge with significant extension into the inferior aspect left neural foramen and associated hypertrophy of the ligamentum flavum and facet joints. Combination of findings produces severe central spinal canal stenosis and moderate to severe narrowing of the left neural foramen.  ??  L3-4 demonstrates disc bulge with extension into the neural foramina. Hypertrophy of the facet joints and ligamentum flavum is detected. Severe central spinal canal stenosis is seen. Severe with narrowing of the left neural foramen is noted.  ??  L4-5 demonstrates bulge with extension into the neural foramen. Hypertrophy of the facet joints and ligamentum flavum is detected. Severe central spinal canal stenosis  is seen with severe narrowing of the right neural foramen.  ??  L5-S1 demonstrates a disc bulge with extension into the neural foramen and a superimposed small central superiorly migrated disc extrusion. Hypertrophy of the facet joints and ligamentum flavum is detected. Moderate central spinal canal stenosis is seen with significant narrowing of the subarticular zone with moderate to severe bilateral neural foramen narrowing.  ??  The paraspinal tissues are within normal limits.  ??  For the purposes of this dictation, the lowest well formed intervertebral disc space is assumed to be the L5-S1 level, and there are presumed to be five lumbar-type vertebral bodies.  ??  IMPRESSION:  ??  Significant multilevel degenerative disc disease on a background of lumbar dextroscoliosis is noted in the lumbar spine and lower T-spine, similar to study from 04/28/2018. Notable findings include:  --L2-3, L3-4, and L4-L5 demonstrate severe central spinal canal stenosis. L1-2 and L5-S1 demonstrate moderate to severe central spinal canal stenosis.  ??  --Severe narrowing of neural foramen seen at multiple levels of the lumbar spine as discussed above.    OBJECTIVE:    PHYSICAL EXAM:  Vitals:    09/06/20 1046   BP: 144/81   Pulse: 76   Resp: 16   Temp: 36.8 ??C (98.2 ??F)   SpO2: 99%     Wt Readings from Last 3 Encounters:   09/06/20 51.7 kg (114 lb)   08/24/20 51.7 kg (114 lb)   08/08/20 49.9 kg (110 lb)     Constitutional: well-developed, well-nourished, and appears to be in no apparent distress. Pleasant and interactive. Patient is a poor historian, most of the history . No evidence of sedation or intoxication  Eyes: Conjunctiva non-icteric, non-injected. Pupils round and equal.  HENT: Normocephalic/atraumatic. Mucous membranes are moist. Oropharynx is clear.  Neck: No visible masses, no overt thyromegaly.  Cardiovascular: Regular rate and rhythm without any murmurs, rubs or gallops. No clubbing cyanosis or edema.   Respiratory: Clear to ausculation bilaterally. No audible wheezes noted. Normal respiratory effort.  Neurologic: The patient was alert and oriented, but it was challenging to obtain a history from the patient. Cranial nerves are grossly normal. Muscle stretch reflexes were 2+ at the patella and 1+ at the achilles bilaterally. No clonus.  Sensation to light touch intact in upper and lower extremities.   Psychiatric: No psychomotor retardation. Normal, full range affect. No agitation.  Musculoskeletal: Normal bulk and ton in the upper and lower extremities bilaterally. Strength exam was challenging, throughout the lower extremities the patient had 5/5 strength throughout, with 5/5 strength to dorsiflexion on the right, and 4/5 strength to plantar flexion on the left that was later 5/5 in strength when re-tested. Dorsiflexion on the right was difficult to assess because of effort, but approximate 4/5 in strength. Gait was unable to be assessed as the patient was in a wheelchair. She did not have her rollator with her.   Spine: Mild tenderness to palpation of lumbar paraspinal muscles. Appreciated dextroscoliosis.  Joints: Good range of motion of all extremities with joints all appearing within normal limits.   Skin: No obvious rashes, lesions, or erythema of the exposed skin. No nodules noted on the exposed skin.

## 2020-09-06 NOTE — Unmapped (Signed)
1. 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  2. Continue Voltaren 1% gel applied to the hands to address arthritis pain, 2 grams up to four times per day. Not to exceed 8 grams in a day, or 2g per application. Your primary care physician can follow-up with this prescription.   3. Follow-up with your primary care physician and with [place] as needed.

## 2020-09-07 NOTE — Unmapped (Signed)
I called the patient's daughter on 09/07/20 to further discuss the timing of the symptoms and it was clear that this began after the hospitalization, after the Hinda Glatter was started to address her psychosis. I was not familiar with this medication, and after further research noted this medication can cause a movement disorder in 7% of patients. I contacted her psychiatry team to discuss whether or not some of the patients symptoms (proprioception difficulty, feeling like she could not feel where her legs are in space, and gait disturbance/unsteadiness) could be related to [place].

## 2020-09-08 ENCOUNTER — Telehealth
Admit: 2020-09-08 | Discharge: 2020-09-09 | Payer: MEDICARE | Attending: Addiction (Substance Use Disorder) | Primary: Addiction (Substance Use Disorder)

## 2020-09-08 DIAGNOSIS — F250 Schizoaffective disorder, bipolar type: Principal | ICD-10-CM

## 2020-09-08 DIAGNOSIS — F4310 Post-traumatic stress disorder, unspecified: Principal | ICD-10-CM
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
## 2020-09-14 NOTE — Unmapped (Signed)
Pt pre-called and triaged.

## 2020-09-14 NOTE — Unmapped (Signed)
Pt called for triage, no answer. I have left a voicemail with instructions to call office.

## 2020-09-15 ENCOUNTER — Institutional Professional Consult (permissible substitution)
Admit: 2020-09-15 | Discharge: 2020-09-16 | Payer: MEDICARE | Attending: Addiction (Substance Use Disorder) | Primary: Addiction (Substance Use Disorder)

## 2020-09-15 DIAGNOSIS — F4310 Post-traumatic stress disorder, unspecified: Principal | ICD-10-CM

## 2020-09-15 DIAGNOSIS — F250 Schizoaffective disorder, bipolar type: Principal | ICD-10-CM
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
## 2020-09-15 NOTE — Unmapped (Signed)
Mrs Neils called and ask for callback to get e-mail to send information to. I called her back and gave e-mail info for her to send  updated insurance information to put in file.

## 2020-09-18 ENCOUNTER — Telehealth
Admit: 2020-09-18 | Discharge: 2020-09-19 | Payer: MEDICARE | Attending: Physician Assistant | Primary: Physician Assistant

## 2020-09-18 DIAGNOSIS — R143 Flatulence: Principal | ICD-10-CM

## 2020-09-18 DIAGNOSIS — R197 Diarrhea, unspecified: Principal | ICD-10-CM

## 2020-09-18 NOTE — Unmapped (Signed)
[facility] RETURN PHONE TELEMEDICINE VISIT      REFERRING PROVIDER:  Libby Maw, FNP  [address]  [address]  [place],  [place] [address]    PRIMARY CARE PROVIDER:  [facility]      PATIENT PROFILE:        Cindy [name] is a 80 y.o. female (DOB: May 09, 1941) who is participating in a phone return visit for evaluation of history of abdominal bloating and distention, and delayed motility, recurrent small bowel bacterial overgrowth.        The patient reports they are currently: at home. I spent 30 minutes on the phone visit with the patient on the date of service. I spent an additional 10 minutes on pre- and post-visit activities on the date of service.     The patient was located and I was located within 250 yards of a hospital based location during the phone visit. The patient was physically located in [place] or a state in which I am permitted to provide care. The patient and/or parent/guardian understood that s/he may incur co-pays and cost sharing, and agreed to the telemedicine visit. The visit was reasonable and appropriate under the circumstances given the patient's presentation at the time.    The patient and/or parent/guardian has been advised of the potential risks and limitations of this mode of treatment (including, but not limited to, the absence of in-person examination) and has agreed to be treated using telemedicine. The patient's/patient's family's questions regarding telemedicine have been answered.    If the visit was completed in an ambulatory setting, the patient and/or parent/guardian has also been advised to contact their provider???s office for worsening conditions, and seek emergency medical treatment and/or call 911 if the patient deems either necessary.              CHIEF COMPLAINT: History of abdominal bloating and distention, alternating diarrhea and constipation with abdominal pain and discomfort    HISTORY OF PRESENT ILLNESS: This is a 80 y.o. year old female with a longstanding history of motility issues including recurrent small bowel material overgrowth, constipation alternating with diarrhea with excess gas bloating and distention.  Previously completing cycling her rounds of antibiotics as needed due to recurrent small bowel bacterial overgrowth.  Due to issues with mobility and other medical problems the patient is currently living in an assisted living facility and this facility has had difficulty providing meals that follow the low FODMAP diet.  Patient is describing diarrhea with excess gas and flatulence and bloating with occasional abdominal discomfort.  She and her daughter would like to know what medication she can use to help mitigate or manage symptoms in the interim.  Debera states that she has had at least one episode of large volume fecal loss noted at night when sleeping.  She was not aware that there was stool inside of her rectum before she went to bed that night.  Large volume of stool noted the next day.  She denies any regular stool leakage or incontinence.  She denies any history with constipation.  Stools now are loosely formed with a mixture of gas content. Some rectal urgency with defecation.  She does not go days without having a bowel movement.  She had been using Pepto-Bismol at home to help regulate her bowel habit but has not been able to use this once in the facility.  She denies any broken rectum melena hematochezia.  Conference today is concluded with her daughter  present.    Problem List Items Addressed This Visit           Visit Diagnoses     Diarrhea, unspecified type    -  Primary    Excessive gas              REVIEW OF SYSTEMS:     The balance of 12 systems reviewed is negative except as noted in the HPI.     PAST MEDICAL HISTORY:    Past Medical History:   Diagnosis Date   ??? ABMD (anterior basement membrane dystrophy)    ??? Allergic rhinitis    ??? Arthritis    ??? Cataract     Nuclear sclerotic cataract OS, BCVA 20/20    ??? Dry eyes    ??? Fibromyalgia    ??? Food intolerance    ??? GERD (gastroesophageal reflux disease)    ??? History of migraine     With Aura   ??? 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 Ptosis     Intermittent ptosis OD   ??? PVD (posterior vitreous detachment), bilateral    ??? Schizoaffective disorder (CMS-HCC)    ??? Sjogren's syndrome (CMS-HCC)        PAST SURGICAL HISTORY:    Past Surgical History:   Procedure Laterality Date   ??? CATARACT EXTRACTION Right 02/08/2013   ??? HERNIA REPAIR      x4   ??? HYSTERECTOMY     ??? SIGMOIDECTOMY     ??? SKIN BIOPSY         MEDICATIONS:      Current Outpatient Medications:   ???  acetaminophen (TYLENOL 8 HOUR) 650 MG CR tablet, Take 1,300 mg by mouth every eight (8) hours as needed for pain., Disp: , Rfl:   ???  amLODIPine (NORVASC) 5 MG tablet, Take 1 tablet (5 mg total) by mouth daily., Disp: 90 tablet, Rfl: 3  ???  ASHWAGANDHA ROOT EXTRACT,BULK, MISC, Take 2 tablets by mouth two (2) times a day. 800 mg per tablet., Disp: , Rfl:   ???  carboxymethylcellulose sodium (THERATEARS) 0.25 % Drop, Administer 2 drops to both eyes every two (2) hours as needed. (Patient taking differently: Administer 2 drops to both eyes every two (2) hours as needed (dry eyes).), Disp: 10 mL, Rfl: 0  ???  cholecalciferol, vitamin D3-50 mcg, 2,000 unit,, 50 mcg (2,000 unit) tablet, Take 1 tablet (50 mcg total) by mouth daily., Disp: , Rfl: 0  ???  diclofenac sodium (VOLTAREN) 1 % gel, Apply 4 g topically Four (4) times a day., Disp: 500 g, Rfl: 3  ???  diclofenac sodium (VOLTAREN) 1 % gel, Apply 2 g topically four (4) times a day. 2 grams 4 times a day total dose across all of the areas placed. May place on the hands up to 4 times per day., Disp: 100 g, Rfl: 1  ???  diphenhydrAMINE (BENADRYL) 25 mg capsule, Take 25 mg by mouth nightly. Patient reported medication to help her sleep, Disp: , Rfl:   ???  docusate sodium (COLACE) 100 MG capsule, Take 1 capsule (100 mg total) by mouth daily., Disp: , Rfl: 0  ???  erythromycin (ROMYCIN) 5 mg/gram (0.5 %) ophthalmic ointment, , Disp: , Rfl:   ???  gabapentin (NEURONTIN) 100 MG capsule, PLEASE SEE ATTACHED FOR DETAILED DIRECTIONS, Disp: 270 capsule, Rfl: 1  ???  gabapentin (NEURONTIN) 300 MG capsule, Take 1-2 pills PO qhs, Disp: 60 capsule, Rfl: 2  ???  glycerin, adult, Supp, Insert 1 suppository into the rectum daily as needed (if bowels are hard.)., Disp: 15 suppository, Rfl: 1  ???  ketoconazole (NIZORAL) 2 % shampoo, , Disp: , Rfl:   ???  lidocaine HCL (ASPERCREME, LIDOCAINE HCL,) 4 % lqro, Apply 1 application topically Three (3) times a day as needed., Disp: , Rfl: 0  ???  melatonin 3 mg Tab, Take 3 mg by mouth every evening., Disp: , Rfl:   ???  olmesartan (BENICAR) 20 MG tablet, Take 1 tablet (20 mg total) by mouth daily., Disp: 90 tablet, Rfl: 3  ???  paliperidone palmitate (INVEGA SUSTENNA) 156 mg/mL Syrg, Inject 1 mL (156 mg total) into the muscle every twenty-eight (28) days for 12 doses., Disp: 1 mL, Rfl: 10  ???  sertraline (ZOLOFT) 50 MG tablet, Take 1 tablet (50 mg total) by mouth daily., Disp: 90 tablet, Rfl: 0    Current Facility-Administered Medications:   ???  paliperidone palmitate (INVEGA SUSTENNA) 156 mg/mL injection 156 mg, 156 mg, Intramuscular, Q28 Days, Cherene Altes, MD, 156 mg at 08/24/20 1112  (Not in a hospital admission)      ALLERGIES:    Carbamazepine, Other, Amitiza [lubiprostone], Aspirin, Ciprofloxacin, Hctz [hydrochlorothiazide], Prednisone, Raloxifene, Shellfish containing products, Wellbutrin [bupropion hcl], Baclofen, Bismuth subsalicylate, and Escitalopram    SOCIAL HISTORY:    Social History     Socioeconomic History   ??? Marital status: Single     Spouse name: None   ??? Number of children: None   ??? Years of education: None   ??? Highest education level: None   Occupational History   ??? None   Tobacco Use   ??? Smoking status: Former Smoker     Years: 30.00     Quit date: 08/19/1993     Years since quitting: 27.1   ??? Smokeless tobacco: Never Used Vaping Use   ??? Vaping Use: Never used   Substance and Sexual Activity   ??? Alcohol use: Yes     Alcohol/week: 1.0 standard drink     Types: 1 Glasses of wine per week     Comment: <1 / month    ??? Drug use: Not Currently   ??? Sexual activity: Not Currently   Other Topics Concern   ??? Do you use sunscreen? Yes   ??? Tanning bed use? No   ??? Are you easily burned? No   ??? Excessive sun exposure? No   ??? Blistering sunburns? No   Social History Narrative    Lives alone with her dog, [place].  Daughter in [place], sister in [place].  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     Social Determinants of Health     Financial Resource Strain: Unknown   ??? Difficulty of Paying Living Expenses: Patient refused   Food Insecurity: No Food Insecurity   ??? Worried About Running Out of Food in the Last Year: Never true   ??? Ran Out of Food in the Last Year: Never true   Transportation Needs: No Transportation Needs   ??? Lack of Transportation (Medical): No   ??? Lack of Transportation (Non-Medical): No   Physical Activity: Not on file   Stress: Not on file   Social Connections: Not on file       FAMILY HISTORY:    Family History   Problem Relation Age of Onset   ??? Cancer Mother         OVARIAN   ???  Depression Mother    ??? Ovarian cancer Mother    ??? Thyroid disease Father    ??? Hypertension Father    ??? Depression Father    ??? Arthritis Father    ??? Heart disease Father    ??? Thyroid disease Sister    ??? Hypertension Sister    ??? Hypertension Maternal Grandmother    ??? Stroke Maternal Grandmother    ??? Hyperlipidemia Daughter    ??? No Known Problems Maternal Grandfather    ??? No Known Problems Paternal Grandmother    ??? No Known Problems Paternal Grandfather    ??? No Known Problems Sister    ??? No Known Problems Brother    ??? No Known Problems Maternal Aunt    ??? No Known Problems Maternal Uncle    ??? No Known Problems Paternal Aunt    ??? No Known Problems Paternal Uncle    ??? No Known Problems Other    ??? Alcohol abuse Neg Hx    ??? COPD Neg Hx    ??? Diabetes Neg Hx    ??? Drug abuse Neg Hx    ??? Hearing loss Neg Hx    ??? Vision loss Neg Hx    ??? Learning disabilities Neg Hx    ??? Melanoma Neg Hx    ??? Basal cell carcinoma Neg Hx    ??? Squamous cell carcinoma Neg Hx    ??? Amblyopia Neg Hx    ??? Blindness Neg Hx    ??? Cataracts Neg Hx    ??? Glaucoma Neg Hx    ??? Macular degeneration Neg Hx    ??? Retinal detachment Neg Hx    ??? Strabismus Neg Hx          PHYSICAL EXAM:        Mental Status:    Thought organized, appropriate affect, pleasantly interactive, not anxious. Responded appropriately to questions.     Location: [facility]      DIAGNOSTIC STUDIES:  I have reviewed all pertinent diagnostic studies, including:    GI Procedures:    Please see last dictation for previous work-up        ASSESSMENT:        Ms. Abadi is an 80 year old female with a history of generalized GI dysmotility, history of recurrent small bowel bacterial overgrowth, history of diarrhea abdominal bloating and gas.  Had previously been following the low FODMAP diet when independent in her home.  Currently patient is staying at a adult facility and requested that recommendations for use of Pepto-Bismol be sent to the charge nurse information provided.  We discussed the difficulties in using Pepto-Bismol regularly without changing or alternating the dose depending on bowel habit.  She did agreed to a base order of 1 capful of liquid Pepto-Bismol as needed, however this will be written for 1 capful daily.  Whether or not the patient needs to take this on a regular basis will be determined by the patient and she will not take if she has not had a formed bowel movement.  She agreed to regulate the dose of this medication depending on her bowel habit frequency and texture.  This order will be sent to her charge nurse with any recommendations on how future orders can be presented.  We also discussed the low FODMAP diet and due to Covid and other barriers this may not be possible at this time but instructions will be provided.  I did discuss a trial of a bowel  retraining program where she can schedule attempts at defecation in order to reduce chances of fecal loss.  This will be sent to her through my chart.  I will send information to her clinic nurse to review general plan      PLAN:          1.  Pepto-Bismol liquid, 1 capful daily, regulate according to bowel habit texture and frequency this can be increased or decreased as needed.  2.  Information regarding bowel retraining which will be sent by my chart.  This is a scheduled attempt to evacuate stool without waiting for rectal urge to defecate.  3.  Communication in regards to these issues will be sent to the head nurse, [email or web address].  4.  Follow up in 3 months, earlier if needed.          Patient agreed to a phone telemedicine visit. I spent 40 minutes with the patient during the visit to discuss evaluation and management of symptoms.            This dictation was done with [facility] [employer]. Document was reviewed but inadvertent errors in transcription may be present      Thank you for your referral to [facility] clinic. As stated on the referral form that was completed at initiation of the referral of your patient; ???All new patients are seen for an initial consultation at the request of referring physicians. [facility] faculty will determine the need for transfer of care to [facility] at the time of initial consultation.??? Because our clinicians have limited clinic they are unable to provide walk-in or more urgent care in most cases. The patient will still need a local clinician to continue their care. [facility] does not accept ???transfer of care??? or a write in for ???consultation and treatment. Recommendations will be made to the patient and further care and clinic appointments will be discussed at the initial consultation. In the interim, the patient will continue to follow with his/her PCP and referring physician for general care and urgent matters. Management of current GI symptoms should also be done in coordination with PCP and/or referring GI physician.      Thank you.

## 2020-09-18 NOTE — Unmapped (Signed)
1.  Pepto-Bismol liquid, 1 capful daily, regulate according to bowel habit texture and frequency this can be increased or decreased as needed.  2.  Information regarding bowel retraining which will be sent by my chart.  This is a scheduled attempt to evacuate stool without waiting for rectal urge to defecate.  3.  Communication in regards to these issues will be sent to the head nurse, [email or web address].  4.  Follow up in 3 months, earlier if needed.    Trellis Paganini MPAS, PA-C  [email or web address]  Fax   [phone]  For test results, medication questions or other issues, nurse's line, Lytle Butte, RN, [phone] or [phone]      For questions regarding scheduling GI procedures, please call, [phone]    For questions regarding radiology appointments, or to schedule, [phone]    For clinic appointments, [phone].

## 2020-09-20 NOTE — Unmapped (Signed)
[facility] Administered Medication Refill Coordination Note      NAME:Cindy [name] DOB: [birth year]      Medication: invega sustenna  Day Supply: 28 days      SHIPPING      Next delivery from [facility] [phone]) to [facility] for Cindy [name] is scheduled for 02/08.    Clinic contact: Delanna Ahmadi    Patient's next nurse visit for administration: n/a.    We will follow up with clinic monthly for standard refill processing and delivery.      Jafar Poffenberger Samella Parr  Specialty Pharmacy Technician

## 2020-09-21 ENCOUNTER — Institutional Professional Consult (permissible substitution): Admit: 2020-09-21 | Discharge: 2020-09-22 | Payer: MEDICARE | Attending: Psychiatry | Primary: Psychiatry

## 2020-09-21 DIAGNOSIS — F259 Schizoaffective disorder, unspecified: Principal | ICD-10-CM

## 2020-09-21 NOTE — Unmapped (Signed)
Administered long-acting injectable today, 09/21/2020. See MAR for additional documentation. Alastor Kneale Orson Ape freitas, CMA

## 2020-09-22 ENCOUNTER — Institutional Professional Consult (permissible substitution)
Admit: 2020-09-22 | Discharge: 2020-09-23 | Payer: MEDICARE | Attending: Addiction (Substance Use Disorder) | Primary: Addiction (Substance Use Disorder)

## 2020-09-22 DIAGNOSIS — F250 Schizoaffective disorder, bipolar type: Principal | ICD-10-CM
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
## 2020-09-25 ENCOUNTER — Ambulatory Visit: Admit: 2020-09-25 | Discharge: 2020-09-26 | Payer: MEDICARE

## 2020-09-25 DIAGNOSIS — M1712 Unilateral primary osteoarthritis, left knee: Principal | ICD-10-CM

## 2020-09-25 DIAGNOSIS — M170 Bilateral primary osteoarthritis of knee: Principal | ICD-10-CM

## 2020-09-25 DIAGNOSIS — M1711 Unilateral primary osteoarthritis, right knee: Principal | ICD-10-CM

## 2020-09-25 NOTE — Unmapped (Signed)
I saw and evaluated the patient, participating in the key portions of the service.  I reviewed the resident’s note.  I agree with the resident’s findings and plan. Mose Colaizzi W Celisse Ciulla, MD

## 2020-09-25 NOTE — Unmapped (Signed)
NEW PATIENT ??? KNEE    Chief complaint:  Bilateral knee pain    History of Present Illness:    Cindy [name] is a 80 y.o. female who presents with a >10y years history of bilateral knee pain.  Right worse than left.  The patient locates the pain on the medial aspect of the knee and states the pain is worse with weight-bearing.  The patient uses a walker for transfers (approximately 5 steps), however primarily mobile with a wheelchair.  The patient does have difficulty rising from seated position.  Does not traverse stairs.  The patient does have swelling.  She does have a feeling of instability, particularly feels that the right knee gives out.    She does have occasional clicking and popping in the knee.  She can ambulate approximately 5 feet with walker before needing to rest.  The patient does have back and hip pain.  The patient has some relief with the use of Voltaren gel, Tylenol, gabapentin.  She has had 1 injection in the right knee previously, approximately 2 years ago, reportedly no significant relief.  The pain is  worse at the end of the day.  The pain and loss of function are affecting the patient???s everyday life.    Of note, patient also has chronic right foot drop in the setting of lumbar degenerative disc disease.  Wears an AFO daily.  Foot drop has worsened since 02/2020, has affected her mobility with walker.  Her daughter reports that the patient has experienced a decline over the last 1 year.  Was previously ambulating with walker for long distances, though over the last 6 months has been unable.  She was admitted to the  hospital within the last year for depression with associated psychosis.  Functional decline has been worse since her hospitalization.    Past Medical/Surgical History:    Medical history significant for depression, GERD, Sjogren's (dental caries, dry eyes), hypertension, dental caries, mitral valve prolapse, lumbar degenerative disc disease with associated right foot drop. Denies previous MI, diabetes, seizures, cancer, CVA, bleeding problems, DVT, asthma, history of MRSA.    Surgical history includes bowel resection, rectal plexi, hernia repairs, tonsillectomy.    Medications:    Denies any blood thinners or immunosuppressive medications.    Current Outpatient Medications:   ???  acetaminophen (TYLENOL 8 HOUR) 650 MG CR tablet, Take 1,300 mg by mouth every eight (8) hours as needed for pain., Disp: , Rfl:   ???  amLODIPine (NORVASC) 5 MG tablet, Take 1 tablet (5 mg total) by mouth daily., Disp: 90 tablet, Rfl: 3  ???  ASHWAGANDHA ROOT EXTRACT,BULK, MISC, Take 2 tablets by mouth two (2) times a day. 800 mg per tablet., Disp: , Rfl:   ???  carboxymethylcellulose sodium (THERATEARS) 0.25 % Drop, Administer 2 drops to both eyes every two (2) hours as needed. (Patient taking differently: Administer 2 drops to both eyes every two (2) hours as needed (dry eyes).), Disp: 10 mL, Rfl: 0  ???  cholecalciferol, vitamin D3-50 mcg, 2,000 unit,, 50 mcg (2,000 unit) tablet, Take 1 tablet (50 mcg total) by mouth daily., Disp: , Rfl: 0  ???  diclofenac sodium (VOLTAREN) 1 % gel, Apply 4 g topically Four (4) times a day., Disp: 500 g, Rfl: 3  ???  diclofenac sodium (VOLTAREN) 1 % gel, Apply 2 g topically four (4) times a day. 2 grams 4 times a day total dose across all of the areas placed. May place on the  hands up to 4 times per day., Disp: 100 g, Rfl: 1  ???  diphenhydrAMINE (BENADRYL) 25 mg capsule, Take 25 mg by mouth nightly. Patient reported medication to help her sleep, Disp: , Rfl:   ???  docusate sodium (COLACE) 100 MG capsule, Take 1 capsule (100 mg total) by mouth daily., Disp: , Rfl: 0  ???  erythromycin (ROMYCIN) 5 mg/gram (0.5 %) ophthalmic ointment, , Disp: , Rfl:   ???  gabapentin (NEURONTIN) 100 MG capsule, PLEASE SEE ATTACHED FOR DETAILED DIRECTIONS, Disp: 270 capsule, Rfl: 1  ???  gabapentin (NEURONTIN) 300 MG capsule, Take 1-2 pills PO qhs, Disp: 60 capsule, Rfl: 2  ???  glycerin, adult, Supp, Insert 1 suppository into the rectum daily as needed (if bowels are hard.)., Disp: 15 suppository, Rfl: 1  ???  ketoconazole (NIZORAL) 2 % shampoo, , Disp: , Rfl:   ???  lidocaine HCL (ASPERCREME, LIDOCAINE HCL,) 4 % lqro, Apply 1 application topically Three (3) times a day as needed., Disp: , Rfl: 0  ???  melatonin 3 mg Tab, Take 3 mg by mouth every evening., Disp: , Rfl:   ???  olmesartan (BENICAR) 20 MG tablet, Take 1 tablet (20 mg total) by mouth daily., Disp: 90 tablet, Rfl: 3  ???  paliperidone palmitate (INVEGA SUSTENNA) 156 mg/mL Syrg, Inject 1 mL (156 mg total) into the muscle every twenty-eight (28) days for 12 doses., Disp: 1 mL, Rfl: 10  ???  sertraline (ZOLOFT) 50 MG tablet, Take 1 tablet (50 mg total) by mouth daily., Disp: 90 tablet, Rfl: 0    NKDA    Review of Systems:  Notably absent for myocardial infarction, diabetes, seizure, cancer, CVA, bleeding problems, DVT, asthma.  Negative for history of MRSA.    Family History: No personal or family history of anesthesia or bleeding problems.    Social History:  Patient lives in an assisted living facility.  Uses a walker for mobilization, though primarily mobile with a wheelchair except for transfers.  Prior history of tobacco use, quit approximately 30 years ago.  History of occasional alcohol use, quit approximately 4 months ago.    Physical Examination:    Vitals: There were no vitals filed for this visit.    Gait: Patient unable to ambulate independently.  Sitting in wheelchair.    Spine and Pelvis:  no midline tenderness.  Patient unable to transfer to exam table for supine leg length examination, however leg lengths appear symmetric in extension while sitting upright.    Right hip:  Greater trochanter with mild tenderness to palpation.  Location: Anterior hip/groin; Severity: 3/10 with flexion and internal rotation.    Left hip:  Greater trochanter non-tender.  No pain with flexion and internal rotation.    Right knee: Trace effusion; ROM 10-100; tender over the medial joint line; tender over the lateral joint line; peripatellar region tender; stable to varus stress testing, stable to valgus stress testing;  Negative anterior drawer test; Negative posterior drawer test; mild pain with circumduction maneuvers; active knee extension    Left knee:  No significant effusion; ROM 10-100; tender over the medial joint line; non-tender over the lateral joint line; peripatellar region non-tender; stable to varus stress testing, stable to valgus stress testing;  Negative anterior drawer test; Negative posterior drawer test; mild pain with circumduction maneuvers; active knee extension    Neurovascular exam:  Motor:  3/5 in the right TA, 0/5 right EHL. Otherwise 5/5 throughout  Sensation:  Diminished in medial plantar distribution in the  right foot, otherwise, normal to light touch below both knees  2+ DP pulses bilaterally    Skin:  No ulcerations or lesions bilateral lower extremities    Radiographs:  Bilateral knee: Radiographs with evidence of severe, end-stage tricompartmental arthritis, significant joint line narrowing most notable medially, subchondral sclerosis, osteophytes.      Assessment:  80 y.o. female with history of depression, GERD, Sjogren's, dental caries, lumbar degenerative disc disease, right foot drop, mitral valve prolapse who presents with bilateral chronic knee pain in the setting of severe bilateral knee osteoarthritis.    Plan: We had a good discussion today with the patient regarding her symptoms and options for management.  She does have evidence of end-stage osteoarthritis in her bilateral knees on today's x-rays.  She has very limited mobility, only ambulating with walker for transfers, otherwise wheelchair-bound.  Also wears AFO for right foot drop at all times.  She has had a sharp decline in her functional status over the last 1 year.  She is pending dental procedures for active dental caries.  Given her current functional status, there is significant concern that she would be unable to undergo postoperative rehab.  She is not a current candidate for total knee arthroplasty based on her current functional status as well as ongoing medical comorbidities.  We discussed that there would be significant risk to the procedure without significant benefit.  We advised continued nonoperative management in the form of over-the-counter pain medications, Voltaren gel, compression sleeves.  Patient and her daughter were amenable to this plan.  All questions and concerns addressed.  She may follow-up on an as-needed basis.

## 2020-09-26 MED FILL — INVEGA SUSTENNA 156 MG/ML INTRAMUSCULAR SYRINGE: INTRAMUSCULAR | 28 days supply | Qty: 1 | Fill #6

## 2020-09-29 ENCOUNTER — Ambulatory Visit
Admit: 2020-09-29 | Payer: MEDICARE | Attending: Addiction (Substance Use Disorder) | Primary: Addiction (Substance Use Disorder)

## 2020-09-30 NOTE — Unmapped (Unsigned)
Client answered and hung up the phone when therapist called twice for scheduled telephone therapy session. Therapist was unable to leave voicemail on client's phone. Therapist called client's daughter and left voicemail requesting that daughter or client call the [facility] office to reschedule when it was convenient for them.

## 2020-09-30 NOTE — Unmapped (Signed)
Client answered and hung up the phone when therapist called twice for scheduled telephone therapy session. Therapist was unable to leave voicemail on client's phone. Therapist called client's daughter and left voicemail requesting that daughter or client call the [facility] office to reschedule when it was convenient for them.

## 2020-10-03 ENCOUNTER — Institutional Professional Consult (permissible substitution)
Admit: 2020-10-03 | Discharge: 2020-10-04 | Payer: MEDICARE | Attending: Addiction (Substance Use Disorder) | Primary: Addiction (Substance Use Disorder)

## 2020-10-03 DIAGNOSIS — F4310 Post-traumatic stress disorder, unspecified: Principal | ICD-10-CM

## 2020-10-03 DIAGNOSIS — F250 Schizoaffective disorder, bipolar type: Principal | ICD-10-CM
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
## 2020-10-10 NOTE — Unmapped (Signed)
Ophthalmology Pre-Operative H&P      History of Present Illness:  80 y.o. female here today for Blepharoplasty, upper eyelid Both; Ectropion Repair - Lateral tarsal strip Both         Allergies:  Carbamazepine, Other, Amitiza [lubiprostone], Aspirin, Ciprofloxacin, Hctz [hydrochlorothiazide], Prednisone, Raloxifene, Shellfish containing products, Wellbutrin [bupropion hcl], Baclofen, Bismuth subsalicylate, and Escitalopram    Medications:   Current Facility-Administered Medications   Medication Dose Route Frequency Provider Last Rate Last Admin   ??? paliperidone palmitate (INVEGA SUSTENNA) 156 mg/mL injection 156 mg  156 mg Intramuscular Q28 Days Cherene Altes, MD   156 mg at 09/21/20 1048     Current Outpatient Medications   Medication Sig Dispense Refill   ??? acetaminophen (TYLENOL 8 HOUR) 650 MG CR tablet Take 1,300 mg by mouth every eight (8) hours as needed for pain.     ??? amLODIPine (NORVASC) 5 MG tablet Take 1 tablet (5 mg total) by mouth daily. 90 tablet 3   ??? ASHWAGANDHA ROOT EXTRACT,BULK, MISC Take 2 tablets by mouth two (2) times a day. 800 mg per tablet.     ??? carboxymethylcellulose sodium (THERATEARS) 0.25 % Drop Administer 2 drops to both eyes every two (2) hours as needed. (Patient taking differently: Administer 2 drops to both eyes every two (2) hours as needed (dry eyes).) 10 mL 0   ??? cholecalciferol, vitamin D3-50 mcg, 2,000 unit,, 50 mcg (2,000 unit) tablet Take 1 tablet (50 mcg total) by mouth daily.  0   ??? diclofenac sodium (VOLTAREN) 1 % gel Apply 4 g topically Four (4) times a day. 500 g 3   ??? diphenhydrAMINE (BENADRYL) 25 mg capsule Take 25 mg by mouth nightly. Patient reported medication to help her sleep     ??? docusate sodium (COLACE) 100 MG capsule Take 1 capsule (100 mg total) by mouth daily.  0   ??? erythromycin (ROMYCIN) 5 mg/gram (0.5 %) ophthalmic ointment      ??? gabapentin (NEURONTIN) 100 MG capsule PLEASE SEE ATTACHED FOR DETAILED DIRECTIONS 270 capsule 1   ??? gabapentin (NEURONTIN) 300 MG capsule Take 1-2 pills PO qhs 60 capsule 2   ??? glycerin, adult, Supp Insert 1 suppository into the rectum daily as needed (if bowels are hard.). 15 suppository 1   ??? ketoconazole (NIZORAL) 2 % shampoo      ??? lidocaine HCL (ASPERCREME, LIDOCAINE HCL,) 4 % lqro Apply 1 application topically Three (3) times a day as needed.  0   ??? melatonin 3 mg Tab Take 3 mg by mouth every evening.     ??? olmesartan (BENICAR) 20 MG tablet Take 1 tablet (20 mg total) by mouth daily. 90 tablet 3   ??? paliperidone palmitate (INVEGA SUSTENNA) 156 mg/mL Syrg Inject 1 mL (156 mg total) into the muscle every twenty-eight (28) days for 12 doses. 1 mL 10   ??? sertraline (ZOLOFT) 50 MG tablet Take 1 tablet (50 mg total) by mouth daily. 90 tablet 0     No medications prior to admission.       Past Medical History:   Diagnosis Date   ??? ABMD (anterior basement membrane dystrophy)    ??? Allergic rhinitis    ??? Arthritis    ??? Cataract     Nuclear sclerotic cataract OS, BCVA 20/20    ??? Dry eyes    ??? Fibromyalgia    ??? Food intolerance    ??? GERD (gastroesophageal reflux disease)    ??? History of migraine  With Aura   ??? IBS (irritable bowel syndrome)    ??? 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 Osteoarthritis    ??? Ptosis     Intermittent ptosis OD   ??? PVD (posterior vitreous detachment), bilateral    ??? Schizoaffective disorder (CMS-HCC)    ??? Sjogren's syndrome (CMS-HCC)    ??? Small intestinal bacterial overgrowth (SIBO)        Past Surgical History:   Procedure Laterality Date   ??? CATARACT EXTRACTION Right 02/08/2013   ??? HERNIA REPAIR      x4   ??? HYSTERECTOMY      PARTIAL   ??? SIGMOIDECTOMY     ??? SKIN BIOPSY     ??? TONSILLECTOMY AND ADENOIDECTOMY         Family History:  family history includes Arthritis in her father; Cancer in her mother; Depression in her father and mother; Heart disease in her father; Hyperlipidemia in her daughter; Hypertension in her father, maternal grandmother, and sister; No Known Problems in her brother, maternal aunt, maternal grandfather, maternal uncle, paternal aunt, paternal grandfather, paternal grandmother, paternal uncle, sister, and another family member; Ovarian cancer in her mother; Stroke in her maternal grandmother; Thyroid disease in her father and sister.  There is no reported history of clotting disorders or anesthesia problems.    Social History:  Tobacco use:   reports that she quit smoking about 27 years ago. She quit after 30.00 years of use. She has never used smokeless tobacco.  Alcohol use:   reports current alcohol use of about 1.0 standard drink of alcohol per week.  Drug use:  reports previous drug use.    Review of Systems:    10 point review of systems is negative other than HPI.    Objective:     Vital Signs:     No intake/output data recorded.    Physical Exam:  General: NAD.  Neuro/Psych: Alerted and Oriented x 3.  Ambulatory.  Psych: behavior wnl.  HEENT: normocephalic.  EYES: operative eye marked  RESP: normal work of breathing.  CV: normal heart rate.  SKIN: warm and dry.    Please page the ophthalmology consults resident or resident on call with questions.  [facility], [address] ([address], intersection with [address]) can be reached at [phone].

## 2020-10-11 ENCOUNTER — Encounter
Admit: 2020-10-11 | Discharge: 2020-10-11 | Payer: MEDICARE | Attending: Student in an Organized Health Care Education/Training Program | Primary: Student in an Organized Health Care Education/Training Program

## 2020-10-11 ENCOUNTER — Ambulatory Visit: Admit: 2020-10-11 | Discharge: 2020-10-11 | Payer: MEDICARE

## 2020-10-11 MED ORDER — NEOMYCIN-POLYMYXIN-DEXAMETH 3.5 MG/ML-10,000 UNIT/ML-0.1% EYE DROPS
Freq: Four times a day (QID) | OPHTHALMIC | 0 refills | 25.00000 days | Status: CP
Start: 2020-10-11 — End: 2020-10-11
  Filled 2020-10-11: qty 5, 25d supply, fill #0

## 2020-10-11 MED ORDER — ERYTHROMYCIN 5 MG/GRAM (0.5 %) EYE OINTMENT
3 refills | 0.00000 days | Status: CP
Start: 2020-10-11 — End: 2020-10-11
  Filled 2020-10-11: qty 3.5, 5d supply, fill #0

## 2020-10-11 MED ADMIN — midazolam (VERSED) injection: INTRAVENOUS | @ 22:00:00 | Stop: 2020-10-11

## 2020-10-11 MED ADMIN — tetracaine HCl (PF) (ALTACAINE) 0.5 %: OPHTHALMIC | @ 22:00:00 | Stop: 2020-10-11

## 2020-10-11 MED ADMIN — acetaminophen (TYLENOL) tablet 1,000 mg: 1000 mg | ORAL | @ 23:00:00 | Stop: 2020-10-11

## 2020-10-11 MED ADMIN — ondansetron (ZOFRAN) injection: INTRAVENOUS | @ 22:00:00 | Stop: 2020-10-11

## 2020-10-11 MED ADMIN — bacitracin-polymyxin b (POLYSPORIN) ophthalmic ointment: OPHTHALMIC | @ 22:00:00 | Stop: 2020-10-11

## 2020-10-11 MED ADMIN — lidocaine-EPINEPHrine (PF) (XYLOCAINE W/EPI) 2 %-1:200,000 injection: OPHTHALMIC | @ 22:00:00 | Stop: 2020-10-11

## 2020-10-12 NOTE — Unmapped (Signed)
Preoperative Diagnosis:    1. Visually significant dermatochalasis Both Upper Eyelid(s)  2. Lower eyelid laxity with ectropion,  Both  lower eyelid(s).    Postoperative Diagnosis:    Same.    Procedure(s) Performed:     1. Upper eyelid blepharoplasty, functional Both   2. Lateral tarsal strip procedure,  Both  lower eyelid(s).    Teaching Surgeon: Claris Pong, MD    Assistants: Les Pou, MD    Anesthesia: MAC    Specimens: None.    Estimated Blood Loss: Less than 5mL.    Complications: None.    Implants: None    Operative Findings: None     Indications for Procedure:    The patient was previously seen in the office and evaluated prior to surgery and found to have visually significant dermatochalasis and mechanical ptosis due to brow descent as demonstrated by pictures and visual fields. The risks, benefits, and alternatives of the procedure were discussed in detail and informed consent was obtained.    Description of procedure:   Allergies were reviewed and the patient is allergic to carbamazepine, other, amitiza [lubiprostone], aspirin, ciprofloxacin, hctz [hydrochlorothiazide], prednisone, raloxifene, shellfish containing products, wellbutrin [bupropion hcl], baclofen, bismuth subsalicylate, and escitalopram..     On the day of surgery, the patient was greeted in the pre-operative area and the site was marked. All questions were answered at that time. The patient was then brought to the operating suite and reclined supine. A timeout was perfomed and surgical sites were marked. Local anesthetic consisting of 2% lidocaine with 1:100000 epinephrine was injected into the operative sites. The patient was then prepped and draped in the usual sterile fashion for oculofacial surgery.     Attention was turned to the Right lateral canthal angle. A 15 blade was used to open the skin at the lateral canthus to create a lateral canthotomy.  Westcott scissors were then used to completely divide the canthal angle. Hemostasis was obtained with thermocautery. An inferior cantholysis was then performed. The anterior and posterior lamella of the lid were divided for approximately 8 mm.  A strip of the epithelium was excised off the superior margin of the tarsal strip and conjunctiva and retractors were incised off the inferior margin of the tarsal strip. Each arm of a double-armed 5-0 PDS suture was then passed posterior to anterior through the terminal portion of the tarsal strip and secured with a single overhand knot on the anterior surface of the tarsal strip. Each arm of the suture was then passed through the periosteum of the inner portion of the lateral orbital rim at the level of Whitnall's tubercle. The sutures were advanced and this provided nice elevation and tightening of the lower eyelid. Once the suture was secured, a thin strip of follicle-bearing skin was excised. The lateral canthal angle was reformed with an interrupted 6-0 vicryl suture. The skin was closed with interrupted 6-0 plain gut sutures.     Attention was then turned to the opposite eyelid where the same procedure was performed in the same manner.     Attention was turned to the blepharoplasty surgery. A #15 blade was used to open the premarked incision line and hemostasis was obtained with thermocautery. Using monopolar cautery, the excess skin was removed while keeping the orbicularis muscle intact.     Once nice symmetry was achieved, the skin incisions were closed with a running 6-0 plain gut suture. Hemostasis was achieved and maintained throughout the surgery with monopolar cautery.  Symmetric surgery was performed on both sides.    The patient tolerated the procedure well. Antibiotic ointment was applied to the incision site(s) followed by ice packs. The patient was taken to the recovery area where she recovered without difficulty.      Teaching Surgeon Attestation: I was scrubbed and present for the entire procedure and performed all critical portions.    Claris Pong, MD/MPH  Oculofacial Plastic & Reconstructive Surgery

## 2020-10-13 NOTE — Unmapped (Signed)
POD1 Telephone Call    Called to check in with Cindy [name] after her procedure to make sure she is doing well without major issues. Voicemail was left with her daughter, encouraged to call if any issues arise before her follow-up appointment.    Claris Pong, MD/MPH  Oculofacial Plastic & Reconstructive Surgery

## 2020-10-18 ENCOUNTER — Ambulatory Visit: Admit: 2020-10-18 | Discharge: 2020-10-19 | Payer: MEDICARE

## 2020-10-18 ENCOUNTER — Ambulatory Visit
Admit: 2020-10-18 | Discharge: 2020-10-19 | Payer: MEDICARE | Attending: Psychiatric/Mental Health | Primary: Psychiatric/Mental Health

## 2020-10-18 DIAGNOSIS — F259 Schizoaffective disorder, unspecified: Principal | ICD-10-CM

## 2020-10-18 MED ORDER — SERTRALINE 100 MG TABLET
ORAL_TABLET | Freq: Every day | ORAL | 0 refills | 90.00000 days | Status: CP
Start: 2020-10-18 — End: 2020-10-18

## 2020-10-18 NOTE — Unmapped (Signed)
Administered long-acting injectable today, 10/18/2020. See MAR for additional documentation. Micholas Drumwright Orson Ape freitas, CMA

## 2020-10-18 NOTE — Unmapped (Signed)
[facility]  Psychiatry   Established Patient E&M Service - Outpatient       Assessment:    Cindy [name] presents for follow-up evaluation.     Identifying Information:  Cindy [name] is a 80 y.o. female with a history of PPD, schizoaffective disorder and hyperthyroidism who was admitted involuntarily to the [facility] 6/18- 03/01/2020, who presents for evaluation of mood, medication and to establish care. . Endorsing depression related to recent decline of physical abilities. Today, Cindy [name] is endorsing anhedonia, low motivation and mood, and sialorrhea. Reports that anxiety is very high and triggered by fear of falling, being left alone in the bathroom and recent significant physical decline.     Risk Assessment:  A full psychiatric risk assessment was conducted on 03/16/2020 and risks do not appear significantly changed from that visit.   While future psychiatric events cannot be accurately predicted, the patient does not currently require acute inpatient psychiatric care and does not currently meet [facility] involuntary commitment criteria.      Plan:    Problem: Schizoaffective disorder  Status of problem: chronic and stable  Interventions: Continue Invega Sustenna  156 mg q 28 days.  Discussed possible risk of side effects of Invega including hyperglycemia, NMS, seizures, EPS, hyperprolactinemia, diabetes, dyslipidemia, tardive dyskinesia, sedation, hyper salivation, orthostatic hypotension, tachycardia, injection site reactions.     Problem: Adjustment disorder  Status of problem:  not improving as expected  Interventions: Recently made appt with new therapist. Pt recently experienced significant change in mobility and independence. Also connected to PT that is helping with independence.       Problem: Sialorrhea  Status of problem: not improving as expected  Interventions: continue ipratropium bromide SL 1-2 sprays up to 4 x per day  Discussed possible side effects of ipratropium bromide including hypersensitivity reactions, bronchospasm, bladder neck obstruction, intraocular pressure, prostatic hyperplasia, urinary retention, abnormal taste in mouth, MI, dry nasal mucosa, sinusitis, CVA    Problem: Depression  Status of problem:  not improving as expected  Interventions: - increase sertraline from 50 to 100 mg daily Discussed possible risk of side effects of sertraline including seizure, induction of mania, activation of SI, sexual dysfunction, GI, sedation, HA dizziness, hyponatremia, serotonin syndrome.      Problem: r/o Mild neurocognitive impairment  Status of problem:  not improving as expected  Interventions: MOCA performed 02/11/20 score:??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 Will repeat at future follow up to reevaluate.     Problem: Insomina  Status of problem:  chronic and stable  Interventions: - continue melatonin 3 mg OTC  -may also take OTC benadryl prn. Aware of risk of anticholinergic side effects.     Problem: Hx of medication side effects  Status of problem:  new problem to this provider  Interventions:Genesight test 03/16/2020     Risks/benefits and indications for treatment with medications above were discussed with the patient. The patient asked appropriate questions, acknowledged understanding of answers, and provided informed consent to initiation&??continuation of medications above.??  ??  Return to clinic appointment: in 3-4 weeks or earlier as needed by patient         Psychotherapy provided:  No billable psychotherapy service provided but brief supportive therapy was utilized.    Patient has been given this writer's contact information as well as the [facility] Psychiatry urgent line number. The patient has been instructed to call 911 for emergencies.      Subjective:    Interval History:  Cindy [name] reports anxiety and fear have been very high. Afraid of falling out of wheel chair. PT has encouraged leg marches but feels very tired and weak. Cataract surgery went well. Finding benefits from therapy with [facility] therapist. Frustrated by the amount of support needed from daughter, [name]. Now receiving higher level of support (level 4).     Past medication history: Fluoxetine (not effective), tegretol (anaphylaxis), lithium (dystonia), paliperidone (hypersalivation)       Objective:    Mental Status Exam:  Appearance:    Appears stated age and Clean/Neat   Motor:   No abnormal movements   Speech/Language:    Normal rate, volume, tone, fluency   Mood:   Depressed   Affect:   Depressed   Thought process and Associations:   Logical, linear, clear, coherent, goal directed   Abnormal/psychotic thought content:     Denies SI, HI, self harm, delusions, obsessions, paranoid ideation, or ideas of reference   Perceptual disturbances:     Denies auditory and visual hallucinations, behavior not concerning for response to internal stimuli     Other:            Visit was completed face to face.      [name], PMHNP

## 2020-10-18 NOTE — Unmapped (Signed)
[facility] Administered Medication Refill Coordination Note      NAME:Cindy [name] DOB: 1940-12-26      Medication: invega sustenna  Day Supply: 28 days      SHIPPING      Next delivery from [facility] [phone]) to [facility] [place] for [name] is scheduled for 03/03.    Clinic contact: Roselind Messier    Patient's next nurse visit for administration: 03/04.    We will follow up with clinic monthly for standard refill processing and delivery.      Camauri Craton Samella Parr  Specialty Pharmacy Technician

## 2020-10-19 MED FILL — INVEGA SUSTENNA 156 MG/ML INTRAMUSCULAR SYRINGE: INTRAMUSCULAR | 28 days supply | Qty: 1 | Fill #7

## 2020-10-20 ENCOUNTER — Institutional Professional Consult (permissible substitution)
Admit: 2020-10-20 | Discharge: 2020-10-21 | Payer: MEDICARE | Attending: Addiction (Substance Use Disorder) | Primary: Addiction (Substance Use Disorder)
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
## 2020-10-26 ENCOUNTER — Ambulatory Visit: Admit: 2020-10-26 | Discharge: 2020-10-27 | Disposition: A | Discharge status: Home / Self Care | Payer: MEDICARE

## 2020-10-26 ENCOUNTER — Emergency Department: Admit: 2020-10-26 | Discharge: 2020-10-27 | Disposition: A | Discharge status: Home / Self Care | Payer: MEDICARE

## 2020-10-26 ENCOUNTER — Ambulatory Visit: Admit: 2020-10-26 | Discharge: 2020-10-27 | Payer: MEDICARE

## 2020-10-26 DIAGNOSIS — S0003XA Contusion of scalp, initial encounter: Principal | ICD-10-CM

## 2020-10-26 DIAGNOSIS — W19XXXA Unspecified fall, initial encounter: Principal | ICD-10-CM

## 2020-10-26 LAB — CBC W/ AUTO DIFF
BASOPHILS ABSOLUTE COUNT: 0 10*9/L (ref 0.0–0.1)
BASOPHILS RELATIVE PERCENT: 0.7 %
EOSINOPHILS ABSOLUTE COUNT: 0.1 10*9/L (ref 0.0–0.5)
EOSINOPHILS RELATIVE PERCENT: 1.5 %
HEMATOCRIT: 32 % — ABNORMAL LOW (ref 34.0–44.0)
HEMOGLOBIN: 11.1 g/dL — ABNORMAL LOW (ref 11.3–14.9)
LYMPHOCYTES ABSOLUTE COUNT: 1.1 10*9/L (ref 1.1–3.6)
LYMPHOCYTES RELATIVE PERCENT: 18.1 %
MEAN CORPUSCULAR HEMOGLOBIN CONC: 34.6 g/dL (ref 32.0–36.0)
MEAN CORPUSCULAR HEMOGLOBIN: 31.9 pg (ref 25.9–32.4)
MEAN CORPUSCULAR VOLUME: 92.3 fL (ref 77.6–95.7)
MEAN PLATELET VOLUME: 6.1 fL — ABNORMAL LOW (ref 6.8–10.7)
MONOCYTES ABSOLUTE COUNT: 0.5 10*9/L (ref 0.3–0.8)
MONOCYTES RELATIVE PERCENT: 7.4 %
NEUTROPHILS ABSOLUTE COUNT: 4.4 10*9/L (ref 1.8–7.8)
NEUTROPHILS RELATIVE PERCENT: 72.3 %
PLATELET COUNT: 230 10*9/L (ref 150–450)
RED BLOOD CELL COUNT: 3.47 10*12/L — ABNORMAL LOW (ref 3.95–5.13)
RED CELL DISTRIBUTION WIDTH: 15.9 % — ABNORMAL HIGH (ref 12.2–15.2)
WBC ADJUSTED: 6.1 10*9/L (ref 3.6–11.2)

## 2020-10-26 LAB — BASIC METABOLIC PANEL
ANION GAP: 8 mmol/L (ref 5–14)
BLOOD UREA NITROGEN: 10 mg/dL (ref 9–23)
BUN / CREAT RATIO: 20
CALCIUM: 9.1 mg/dL (ref 8.7–10.4)
CHLORIDE: 104 mmol/L (ref 98–107)
CO2: 23 mmol/L (ref 20.0–31.0)
CREATININE: 0.5 mg/dL — ABNORMAL LOW
EGFR CKD-EPI AA FEMALE: >90 mL/min/{1.73_m2} (ref >=60)
EGFR CKD-EPI NON-AA FEMALE: >90 mL/min/{1.73_m2} (ref >=60)
GLUCOSE RANDOM: 97 mg/dL (ref 70–179)
POTASSIUM: 3.4 mmol/L (ref 3.4–4.8)
SODIUM: 135 mmol/L (ref 135–145)

## 2020-10-26 MED ORDER — DOXYCYCLINE HYCLATE 100 MG TABLET
ORAL_TABLET | Freq: Two times a day (BID) | ORAL | 0 refills | 7 days | Status: CP
Start: 2020-10-26 — End: 2020-11-02

## 2020-10-26 MED ADMIN — acetaminophen (TYLENOL) tablet 1,000 mg: 1000 mg | ORAL | @ 23:00:00 | Stop: 2020-10-26

## 2020-10-26 NOTE — Unmapped (Signed)
Assessment & Plan:  Cindy [name] is a 80 y.o. female with the following diagnoses:   1. Postoperative eye state       POW2 s/p BULB and BLTS  - healing nicely, incisions intact  - LUL incision with small area of inflammation  - lower lids in excellent position    Photos 10/26/20  POW2          Plan:  - continue erythro  - stop maxitrol  - start doxy 100mg  PO BID for inflammation LUL, prophy for infection  - RTC 6 weeks    Claris Pong, MD/MPH  Oculofacial Plastic & Reconstructive Surgery

## 2020-10-26 NOTE — Unmapped (Addendum)
Instructions:  - Continue erythromycin oph ung to both eyes  - Stop maxitrol gtt  - Start doxycycline 100mg  PO BID for 7 days as prescribed  - RTC 6 weeks

## 2020-10-27 NOTE — Unmapped (Signed)
[facility] from assisted living. PT fell from wheelchair, hit head, hematoma to forehead, deformity noted to LLE. -thinners, - LOC. GCS 15

## 2020-10-27 NOTE — Unmapped (Signed)
[facility] FH from assisted living. PT fell from wheelchair, hit head, hematoma to forehead, deformity noted to LLQ. -thinners, - LOC. GCS 15

## 2020-10-27 NOTE — Unmapped (Shared)
Emergency Department Provider in Triage Note    Cindy [name] is a 80 y.o. female with PMH of arthritis, GERD, Sjogren's syndrome, OA, anxiety, and depression who presents via EMS following a fall out of her automated wheelchair at her SNF. Per EMS, she sustained a hematoma to her forehead, along with deformity to her left leg. She is not anticoagulated. The patient endorses pain in the L hip and L knee.      BRIEF PHYSICAL EXAM    VITAL SIGNS:    ED Triage Vitals [10/26/20 1739]   Vital Signs Group      Temp 36.7 ??C (98 ??F)      Temp Source Oral      Heart Rate 77      SpO2 Pulse       Heart Rate Source       Resp 17      BP 164/89      MAP (mmHg)       BP Location Left arm      BP Method Automatic      Patient Position Lying   SpO2 97 %   O2 Flow Rate (L/min)    O2 Device None (Room air)            Constitutional:  Awake, alert, no apparent distress  HENT:  C-collar in place.   Eyes:  ecchymosis around eyes, though per EMS this is from a recent surgery.   Cardiovascular: Normal rate, regular rhythm. Normal and symmetric distal pulses are present in all extremities.  Respiratory: no respiratory distress, normal work of breathing  GI: nondistended. Abdomen nt.   Integument:  Hematoma and superficial abrasion to L forehead.   Musculoskeletal:  Deformity to L tib-fib with TTP. TTP also to left hip. No chest wall tenderness.  Neurologic:  Alert and appropriate  Psychiatric:  Affect normal, judgment normal, mood normal.        Ordered basic labs, along with XR Tib Fib, XR L Hip, Ct Head, and CT C-spine. Given difficulty of exam in the hallway, will place patient in room 89 D. Dr. Collene Schlichter has been made aware. Will give Tylenol for pain.      Faythe Dingwall  October 26, 2020 5:54 PM    Documentation assistance was provided by Pecola Leisure, Scribe, on October 26, 2020 at 6:02 PM for Dewitt Hoes, MD.    {*** NOTE TO PROVIDER: PLEASE ADD EDPROVSCRIBEATTEST NOTING YOU AGREE WITH SCRIBE DOCUMENTATION}

## 2020-10-27 NOTE — Unmapped (Signed)
[facility] Emergency Department Provider Note    ED Clinical Impression     Final diagnoses:   Contusion of scalp, initial encounter (Primary)   Fall, initial encounter       History     CHIEF COMPLAINT:   Chief Complaint   Patient presents with   ??? Fall       HPI: Cindy [name] is a 80 y.o. 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  Patient reports head trauma but denies any loss of consciousness, nausea or vomiting.  She complains of pain to her left knee around 4-10 and complains of a deformity to the left knee and lower leg.  She denies any numbness or weakness of the left leg.  She denies any other pain or injury.  She denies any neck pain, back pain abdominal pain or diarrhea.  She denies any preceding chest pain or shortness of breath.  Patient reports she is 2 weeks ago had surgical repair of her eyelids but denies any visual disturbance or eye pain.    PAST MEDICAL HISTORY/PAST SURGICAL HISTORY:   Past Medical History:   Diagnosis Date   ??? ABMD (anterior basement membrane dystrophy)    ??? Allergic rhinitis    ??? Anxiety    ??? Arthritis    ??? Cataract     Nuclear sclerotic cataract OS, BCVA 20/20    ??? Depression    ??? Dry eyes    ??? Fibromyalgia    ??? Food intolerance    ??? GERD (gastroesophageal reflux disease)    ??? History of migraine     With Aura   ??? IBS (irritable bowel syndrome)    ??? 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 Osteoarthritis    ??? Ptosis     Intermittent ptosis OD   ??? PVD (posterior vitreous detachment), bilateral    ??? Schizoaffective disorder (CMS-HCC)    ??? Sjogren's syndrome (CMS-HCC)    ??? Small intestinal bacterial overgrowth (SIBO)        Past Surgical History:   Procedure Laterality Date   ??? blepharectomy Bilateral 10/11/2020   ??? CATARACT EXTRACTION Right 02/08/2013   ??? HERNIA REPAIR      x4   ??? HYSTERECTOMY      PARTIAL   ??? OCULOPLASTIC SURGERY Bilateral 10/11/2020    BLEPHAROPLASTY UPPER; W/EXCESS SKIN WT DOWN LID;    ??? PR FIX ECTROPION,ENTENSV LID REPAIR Bilateral 10/11/2020    Procedure: REPAIR OF ECTROPION; EXTENSIVE (EG, TARSAL STRIP OPERATIONS);  Surgeon: Claris Pong, MD;  Location: [facility] OR [facility];  Service: Ophthalmology   ??? PR REV UPPER EYELID W EXCESS SKIN Bilateral 10/11/2020    Procedure: BLEPHAROPLASTY UPPER; W/EXCESS SKIN WT DOWN LID;  Surgeon: Claris Pong, MD;  Location: [facility] OR [facility];  Service: Ophthalmology   ??? SIGMOIDECTOMY     ??? SKIN BIOPSY     ??? TONSILLECTOMY AND ADENOIDECTOMY         MEDICATIONS:     Current Facility-Administered Medications:   ???  paliperidone palmitate (INVEGA SUSTENNA) 156 mg/mL injection 156 mg, 156 mg, Intramuscular, Q28 Days, Cherene Altes, MD, 156 mg at 10/18/20 1111    Current Outpatient Medications:   ???  acetaminophen (TYLENOL 8 HOUR) 650 MG CR tablet,  Take 1,300 mg by mouth every eight (8) hours as needed for pain., Disp: , Rfl:   ???  amLODIPine (NORVASC) 5 MG tablet, Take 1 tablet (5 mg total) by mouth daily., Disp: 90 tablet, Rfl: 3  ???  ASHWAGANDHA ROOT EXTRACT,BULK, MISC, Take 2 tablets by mouth two (2) times a day. 800 mg per tablet., Disp: , Rfl:   ???  carboxymethylcellulose sodium (THERATEARS) 0.25 % Drop, Administer 2 drops to both eyes every two (2) hours as needed. (Patient taking differently: Administer 2 drops to both eyes every two (2) hours as needed (dry eyes).), Disp: 10 mL, Rfl: 0  ???  chlorhexidine (PERIDEX) 0.12 % solution, 15 mL by Mouth route Two (2) times a day. Rinse and spit out excess., Disp: 473 mL, Rfl: 0  ???  cholecalciferol, vitamin D3-50 mcg, 2,000 unit,, 50 mcg (2,000 unit) tablet, Take 1 tablet (50 mcg total) by mouth daily., Disp: , Rfl: 0  ???  diclofenac sodium (VOLTAREN) 1 % gel, Apply 4 g topically Four (4) times a day., Disp: 500 g, Rfl: 3  ??? diphenhydrAMINE (BENADRYL) 25 mg capsule, Take 25 mg by mouth nightly. Patient reported medication to help her sleep, Disp: , Rfl:   ???  docusate sodium (COLACE) 100 MG capsule, Take 1 capsule (100 mg total) by mouth daily., Disp: , Rfl: 0  ???  doxycycline (VIBRA-TABS) 100 MG tablet, Take 1 tablet (100 mg total) by mouth Two (2) times a day for 7 days., Disp: 14 tablet, Rfl: 0  ???  erythromycin (ROMYCIN) 5 mg/gram (0.5 %) ophthalmic ointment, , Disp: , Rfl:   ???  erythromycin (ROMYCIN) 5 mg/gram (0.5 %) ophthalmic ointment, Apply a thin layer to all incisions 3 times a day and inside the lower eyelid at bedtime., Disp: 3.5 g, Rfl: 3  ???  gabapentin (NEURONTIN) 100 MG capsule, PLEASE SEE ATTACHED FOR DETAILED DIRECTIONS, Disp: 270 capsule, Rfl: 1  ???  gabapentin (NEURONTIN) 300 MG capsule, Take 1-2 pills PO qhs, Disp: 60 capsule, Rfl: 2  ???  glycerin, adult, Supp, Insert 1 suppository into the rectum daily as needed (if bowels are hard.)., Disp: 15 suppository, Rfl: 1  ???  ketoconazole (NIZORAL) 2 % shampoo, , Disp: , Rfl:   ???  lidocaine HCL (ASPERCREME, LIDOCAINE HCL,) 4 % lqro, Apply 1 application topically Three (3) times a day as needed., Disp: , Rfl: 0  ???  melatonin 3 mg Tab, Take 3 mg by mouth every evening., Disp: , Rfl:   ???  neomycin-polymyxin-dexamethasone (MAXITROL) 3.5mg /mL-10,000 unit/mL-0.1 % ophthalmic suspension, Administer 1 drop to both eyes four (4) times a day for 14 days., Disp: 5 mL, Rfl: 0  ???  olmesartan (BENICAR) 20 MG tablet, Take 1 tablet (20 mg total) by mouth daily., Disp: 90 tablet, Rfl: 3  ???  paliperidone palmitate (INVEGA SUSTENNA) 156 mg/mL Syrg, Inject 1 mL (156 mg total) into the muscle every twenty-eight (28) days for 12 doses., Disp: 1 mL, Rfl: 10  ???  penicillin v potassium (VEETID) 500 MG tablet, Take 1 tablet (500 mg total) by mouth four (4) times a day., Disp: 20 tablet, Rfl: 0  ???  sertraline (ZOLOFT) 100 MG tablet, Take 1 tablet (100 mg total) by mouth daily., Disp: 90 tablet, Rfl: 0    ALLERGIES:   Carbamazepine, Other, Amitiza [lubiprostone], Aspirin, Ciprofloxacin, Hctz [hydrochlorothiazide], Prednisone, Raloxifene, Shellfish containing products, Wellbutrin [bupropion hcl], Baclofen, Bismuth subsalicylate, and Escitalopram    SOCIAL HISTORY:   Social History  Tobacco Use   ??? Smoking status: Former Smoker     Years: 30.00     Quit date: 08/19/1993     Years since quitting: 27.2   ??? Smokeless tobacco: Never Used   Substance Use Topics   ??? Alcohol use: Yes     Alcohol/week: 1.0 standard drink     Types: 1 Glasses of wine per week     Comment: <1 / month        FAMILY HISTORY:  Family History   Problem Relation Age of Onset   ??? Cancer Mother         OVARIAN   ??? Depression Mother    ??? Ovarian cancer Mother    ??? Thyroid disease Father    ??? Hypertension Father    ??? Depression Father    ??? Arthritis Father    ??? Heart disease Father    ??? Thyroid disease Sister    ??? Hypertension Sister    ??? Hypertension Maternal Grandmother    ??? Stroke Maternal Grandmother    ??? Hyperlipidemia Daughter    ??? No Known Problems Maternal Grandfather    ??? No Known Problems Paternal Grandmother    ??? No Known Problems Paternal Grandfather    ??? No Known Problems Sister    ??? No Known Problems Brother    ??? No Known Problems Maternal Aunt    ??? No Known Problems Maternal Uncle    ??? No Known Problems Paternal Aunt    ??? No Known Problems Paternal Uncle    ??? No Known Problems Other    ??? Alcohol abuse Neg Hx    ??? COPD Neg Hx    ??? Diabetes Neg Hx    ??? Drug abuse Neg Hx    ??? Hearing loss Neg Hx    ??? Vision loss Neg Hx    ??? Learning disabilities Neg Hx    ??? Melanoma Neg Hx    ??? Basal cell carcinoma Neg Hx    ??? Squamous cell carcinoma Neg Hx    ??? Amblyopia Neg Hx    ??? Blindness Neg Hx    ??? Cataracts Neg Hx    ??? Glaucoma Neg Hx    ??? Macular degeneration Neg Hx    ??? Retinal detachment Neg Hx    ??? Strabismus Neg Hx           Review of Systems    A 10 point review of systems was performed and is negative other than positive elements noted in HPI   Constitutional: Negative for fever, diaphoresis, weight loss.  Eyes: Negative for visual changes.  ENT: Negative for sore throat.  Cardiovascular: Negative for chest pain, palpitations.  Respiratory: Negative for shortness of breath, cough.  Gastrointestinal: Negative for abdominal pain, nausea, vomiting or diarrhea.   Genitourinary: Negative for dysuria, urinary frequency.  Musculoskeletal: Negative for back pain,myalgias .  Skin: Negative for rash.  Neurological: Negative for headaches, focal weakness or numbness.    Physical Exam     VITAL SIGNS:    BP 177/93  - Pulse 76  - Temp 36.6 ??C (97.9 ??F) (Oral)  - Resp 16  - LMP  (LMP Unknown)  - SpO2 96%     Constitutional: Alert and oriented x4.  Chronically ill-appearing elderly female lying in the bed in a cervical collar and in no acute distress.  Eyes: Conjunctivae are normal.  Bilateral eyelids with erythema, postoperative incision that is clean dry and intact.  ENT  Head: Normocephalic.  Frontal scalp hematoma with small overlying abrasion without active hemorrhage, is not boggy, no crepitus, no step-offs or deformities to rest of scalp.       Nose: No congestion.       Mouth/Throat: Mucous membranes are moist.       Neck: No stridor.  Hematological/Lymphatic/Immunological: No cervical lymphadenopathy.  Cardiovascular: Regular rate and rhythm. Single S1, S2. 2+ and symmetric distal pulses are present in all extremities. Warm and well perfused. Brisk capillary refill.   Respiratory: Normal respiratory effort. Lungs clear to auscultation bilaterally.   Gastrointestinal: Soft and nontender.  Large periumbilical hernia without overlying skin changes that is nontender and easily reducible. There is no CVA tenderness bilaterally.  Musculoskeletal:  Bilateral upper extremities with no deformity, tenderness, full active range of motion.  Right lower extremity with intact range of motion of the hip knee and ankle without pain.  Left lower extremity with varus deformity at the knee, intact range of motion of the hip without tenderness to palpation.  Left knee with some tenderness to palpation over the lateral aspect around the fibular head.  Intact active flexion and extension of the knee with some crepitus and pain.  Left ankle intact range of motion.  Left lower extremity neurovascular intact with 2+ DP and PT pulses intact sensation to light touch intact motor function.  Neurologic: Normal speech and language. No gross focal neurologic deficits are appreciated.  5/5 strength bilateral upper and lower extremities.  Intact sensation light touch distally in all extremities.  Cranial nerves II through XII tested and intact.  Skin: Skin is warm, dry and intact. No rash noted.  Psychiatric: Mood and affect are normal. Speech and behavior are normal.    ED Course, Assessment and Plan     Initial Clinical Impression:    October 26, 2020 5:52 PM   Cindy [name] is a 80 y.o. 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 On exam, pt is hemodynamically stable, oxygenating well on room air and is afebrile.   Physical exam revealed varus deformity of the left knee with tenderness palpation over the left aspect.  Left lower extremity is neurovascularly intact.  Frontal scalp hematoma with small overlying abrasion that is hemostatic.  Patient in cervical collar, no midline cervical spine tenderness to palpation.  Neurologic exam otherwise unremarkable.  Abdominal exam with hernia  without evidence of strangulation.    BP 177/93  - Pulse 76  - Temp 36.6 ??C (97.9 ??F) (Oral)  - Resp 16  - LMP  (LMP Unknown)  - SpO2 96%     Medical decision making: Patient history and clinical presentation concern for possible fracture of the left tibia-fibula.  Left lower extremity and wrist were intact no evidence of compartment syndrome.  Also concern for possible intracranial hemorrhage, contusion given fall with head trauma patient not on anticoagulant medications.  No evidence of intrathoracic, intra-abdominal trauma.  No evidence of hip trauma.  Will evaluate for possible anemia and metabolic disturbance.     Diagnostic orders as below. Will treat patient with oral Tylenol, she deferred opiate pain medications at this time..    Orders Placed This Encounter   Procedures   ??? CT Head Wo Contrast   ??? CT Cervical Spine Screening   ??? XR Trauma Hip  Left   ??? XR Tibia Fibula Left   ??? Basic Metabolic Panel   ??? CBC w/ Differential       ED Course:  ED Course as of 10/26/20 2325   Thu Oct 26, 2020   1841 CBC with mild anemia compared to prior hemoglobin 11.1.   1940 Patient's x-ray of the left tib-fib without evidence of acute osseous abnormality no concern for fracture.  Is evidence of joint space narrowing likely cause of pain with knee flexion and extension.   1946 Patient hip x-ray with evidence of diffuse osteopenia, no visible fracture or dislocation.   2154 XR Trauma Hip Left   2202 Patient's head CT and cervical spine CT without acute trauma.  Cleared patient of cervical collar clinically without any findings concerning for cervical spine injury.  Discussed with patient reassuring work-up and her daughters at bedside able to provide transportation.  Discussed with patient strict return precautions and symptomatic management with Tylenol.  Discussed precautions with movement to avoid any further falls.  Patient verbalized understanding is agreeable this plan.         _____________________________________________________________________    The case was discussed with attending physician who is in agreement with the above assessment and plan    Additional Medical Decision Making     I have reviewed the vital signs and the nursing notes. Labs and radiology results that were available during my care of the patient were independently reviewed by me and considered in my medical decision making.   I independently visualized the EKG tracing if performed  I independently visualized the radiology images if performed  I reviewed the patient's prior medical records if available.  Additional history obtained from family if available    Radiology     CT Head Wo Contrast   Final Result   No acute intracranial abnormality.      Chronic atrophic and microvascular changes as noted above.      CT Cervical Spine Screening   Final Result      No acute fracture or traumatic listhesis of the cervical spine.      Advanced degenerative disc disease and facet arthropathy most prominent at C6-7. Approximately 4 mm anterolisthesis of C7 on T1, favored to be degenerative.      XR Trauma Hip Left   Final Result   Severe diffuse osteopenia.  No acute osseous abnormality of the left hip.  MRI recommended to exclude nondisplaced pelvis and proximal femur fractures.      XR Tibia Fibula Left   Final Result      No acute osseous abnormality of left tibia or fibula.          Labs     Labs Reviewed   BASIC METABOLIC PANEL - Abnormal; Notable for the following components:       Result Value    Creatinine 0.50 (*)     All other components within normal limits   CBC W/ AUTO DIFF - Abnormal; Notable for the following components:    RBC 3.47 (*)     HGB 11.1 (*)     HCT 32.0 (*)     RDW 15.9 (*)     MPV 6.1 (*)     All other components within normal limits   CBC W/ DIFFERENTIAL    Narrative:     The following orders were created for panel order CBC w/ Differential.  Procedure  Abnormality         Status                     ---------                               -----------         ------                     CBC w/ Differential[[id number]]         Abnormal            Final result                 Please view results for these tests on the individual orders.     Pertinent labs & imaging results that were available during my care of the patient were reviewed by me and considered in my medical decision making (see chart for details).    Please note- This chart has been created using [employer]. Chart creation errors have been sought, but may not always be located and such creation errors, especially pronoun confusion, do NOT reflect on the standard of medical care.    Alycia Rossetti, MD  EM PGY-1     Norva Riffle, MD  Resident  10/26/20 [phone]

## 2020-11-07 NOTE — Unmapped (Signed)
[facility] Administered Medication Refill Coordination Note      NAME:Annel E Insco DOB: 1941/03/13      Medication: [name]  Day Supply: 28 days      SHIPPING      Next delivery from [facility] [phone]) to [facility] for Cindy [name] is scheduled for 03/28.    Clinic contact: Adah Salvage      Patient's next nurse visit for administration: 03/29.    We will follow up with clinic monthly for standard refill processing and delivery.      Andjela Wickes Samella Parr  Specialty Pharmacy Technician

## 2020-11-13 MED FILL — INVEGA SUSTENNA 156 MG/ML INTRAMUSCULAR SYRINGE: INTRAMUSCULAR | 28 days supply | Qty: 1 | Fill #8

## 2020-11-14 ENCOUNTER — Ambulatory Visit
Admit: 2020-11-14 | Discharge: 2020-11-15 | Payer: MEDICARE | Attending: Psychiatric/Mental Health | Primary: Psychiatric/Mental Health

## 2020-11-14 DIAGNOSIS — F259 Schizoaffective disorder, unspecified: Principal | ICD-10-CM

## 2020-11-14 MED ADMIN — paliperidone palmitate (INVEGA SUSTENNA) 156 mg/mL injection 156 mg: 156 mg | INTRAMUSCULAR | @ 15:00:00

## 2020-11-14 NOTE — Unmapped (Signed)
[facility]  Psychiatry   Established Patient E&M Service - Outpatient       Assessment:    Cindy [name] presents for follow-up evaluation.     Identifying Information:  Cindy [name] is a 80 y.o. female with a history of PPD, schizoaffective disorder and hyperthyroidism who was admitted involuntarily to the [facility] unit 6/18- 03/01/2020, who presents for evaluation of mood, medication and to establish care. . Endorsing depression related to recent decline of physical abilities. This is Cindy [name] first outing in a wheel chair. Today, Cindy [name] is endorsing anhedonia, low motivation and mood, and sialorrhea that started after receiving [place] injection.    Risk Assessment:  A full psychiatric risk assessment was conducted on 03/16/2020 and risks do not appear significantly changed from that visit.   While future psychiatric events cannot be accurately predicted, the patient does not currently require acute inpatient psychiatric care and does not currently meet [facility] involuntary commitment criteria.      Plan:    Problem: Schizoaffective disorder  Status of problem: chronic and stable  Interventions: Continue Invega Sustenna  156 mg q 28 days.  Discussed possible risk of [name] effects of Invega including hyperglycemia, NMS, seizures, EPS, hyperprolactinemia, diabetes, dyslipidemia, tardive dyskinesia, sedation, hyper salivation, orthostatic hypotension, tachycardia, injection site reactions.     Problem: Adjustment disorder  Status of problem:  not improving as expected  Interventions: Recently made appt with new therapist. Pt recently experienced significant change in mobility and independence. Also connected to PT that is helping with independence.       Problem: Sialorrhea  Status of problem: not improving as expected  Interventions: continue ipratropium bromide SL 1-2 sprays up to 4 x per day  Discussed possible [name] effects of ipratropium bromide including hypersensitivity reactions, bronchospasm, bladder neck obstruction, intraocular pressure, prostatic hyperplasia, urinary retention, abnormal taste in mouth, MI, dry nasal mucosa, sinusitis, CVA    Problem: Depression  Status of problem:  not improving as expected  Interventions: - continue sertraline 100 mg daily Discussed possible risk of [name] effects of sertraline including seizure, induction of mania, activation of SI, sexual dysfunction, GI, sedation, HA dizziness, hyponatremia, serotonin syndrome.      Problem: r/o Mild neurocognitive impairment  Status of problem:  not improving as expected  Interventions: MOCA performed 02/11/20 score:??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 Will repeat at future follow up to reevaluate.     Problem: Insomina  Status of problem:  chronic and stable  Interventions: - continue melatonin 3 mg OTC  -may also take OTC benadryl prn. Aware of risk of anticholinergic [name] effects.     Problem: Hx of medication [name] effects  Status of problem:  new problem to this provider  Interventions:Genesight test 03/16/2020     Risks/benefits and indications for treatment with medications above were discussed with the patient. The patient asked appropriate questions, acknowledged understanding of answers, and provided informed consent to initiation&??continuation of medications above.??  ??  Return to clinic appointment: in 3-4 weeks or earlier as needed by patient         Psychotherapy provided:  No billable psychotherapy service provided but brief supportive therapy was utilized.    Patient has been given this writer's contact information as well as the [facility] urgent line number. The patient has been instructed to call [phone] for emergencies.      Subjective:    Interval History:   Cindy [name] reports increased anxiety and panic related to her  physical health, being afraid of falling, and treatment by staff at assisted living facilty. Discussed barriers to attending appointments in person. Cindy [name] reports that she prefers in person appointments and when discussing risks benefits to continuing LAI vs. Starting PO paliperidone, Cindy [name] would prefer to continue LAI at this time. Good support from daughter, Cindy [name]. Brought to appointment today by assisted living staff.   Past medication history: Fluoxetine (not effective), tegretol (anaphylaxis), lithium (dystonia), paliperidone (hypersalivation)       Objective:    Mental Status Exam:  Appearance:    Appears stated age and Clean/Neat   Motor:   No abnormal movements   Speech/Language:    Normal rate, volume, tone, fluency   Mood:   Depressed   Affect:   Depressed   Thought process and Associations:   Logical, linear, clear, coherent, goal directed   Abnormal/psychotic thought content:     Denies SI, HI, self harm, delusions, obsessions, paranoid ideation, or ideas of reference   Perceptual disturbances:     Denies auditory and visual hallucinations, behavior not concerning for response to internal stimuli     Other:            Visit was completed face to face.      [name], PMHNP

## 2020-11-14 NOTE — Unmapped (Signed)
Administered long-acting injectable today, 11/14/2020. See MAR for additional documentation. Meryl Dare, CMA

## 2020-11-15 NOTE — Unmapped (Signed)
Follow-up instructions:  -- Please continue taking your medications as prescribed for your mental health.   -- Do not make changes to your medications, including taking more or less than prescribed, unless under the supervision of your physician. Be aware that some medications may make you feel worse if abruptly stopped  -- Please refrain from using illicit substances, as these can affect your mood and could cause anxiety or other concerning symptoms.   -- Seek further medical care for any increase in symptoms or new symptoms such as thoughts of wanting to hurt yourself or hurt others.     Contact info:  Life-threatening emergencies: call 911 or go to the nearest ER for medical or psychiatric attention.     Issues that need urgent attention but are not life threatening: call the clinic at [phone].    Non-urgent routine concerns, questions, and refill requests: please call the clinic for assistance.     Regarding appointments:  - If you need to cancel your appointment, we ask that you call [phone] at least 24 hours before your scheduled appointment.  - If for any reason you arrive 15 minutes later than your scheduled appointment time, you may not be seen and your visit may be rescheduled.  - Please remember that we will not automatically reschedule missed appointments.  - If you miss two (2) appointments without letting us know at least 24 hours in advance, you will likely be referred to a provider in your community.  - We will do our best to be on time. Sometimes an emergency will arise that might cause your clinician to be late. We will try to inform you of this when you check in for your appointment. If you wait more than 15 minutes past your appointment time without such notice, please speak with the front desk staff.    In the event of bad weather, the clinic staff will attempt to contact you, should your appointment need to be rescheduled. Additionally, you can call the Patient Weather Line [phone] for system-wide clinic status    For more information and reminders regarding clinic policies (these were provided when you were admitted to the clinic), please ask the front desk.    Maija Biggers, MPH, PMHNP-BC  Department of Psychiatry  [facility]  [address]  [address]  [place], [place] [address]  Phone: [phone]

## 2020-11-20 NOTE — Unmapped (Signed)
Therapist called pt's daughter to obtain updated about pt's health. Daughter stated that it is still uncertain what has caused her drastic health change the previous week and that they are doing assessments. Daughter stated she will contact [facility] personnel with an update when she learns more information.

## 2020-12-04 ENCOUNTER — Ambulatory Visit
Admit: 2020-12-04 | Discharge: 2020-12-05 | Payer: MEDICARE | Attending: Addiction (Substance Use Disorder) | Primary: Addiction (Substance Use Disorder)

## 2020-12-04 DIAGNOSIS — F250 Schizoaffective disorder, bipolar type: Principal | ICD-10-CM
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
## 2020-12-12 ENCOUNTER — Ambulatory Visit: Admit: 2020-12-12 | Discharge: 2020-12-13 | Payer: MEDICARE

## 2020-12-12 DIAGNOSIS — H02139 Senile ectropion of unspecified eye, unspecified eyelid: Principal | ICD-10-CM

## 2020-12-12 DIAGNOSIS — H02831 Dermatochalasis of right upper eyelid: Principal | ICD-10-CM

## 2020-12-12 DIAGNOSIS — H02403 Unspecified ptosis of bilateral eyelids: Principal | ICD-10-CM

## 2020-12-12 DIAGNOSIS — Z9889 Other specified postprocedural states: Principal | ICD-10-CM

## 2020-12-12 DIAGNOSIS — H02834 Dermatochalasis of left upper eyelid: Principal | ICD-10-CM

## 2020-12-12 NOTE — Unmapped (Signed)
[facility] Administered Medication Refill Coordination Note      NAME:Cindy [name] DOB: 29-Aug-1940      Medication: [name]  Day Supply: 28 days      SHIPPING      Next delivery from [facility] [phone]) to [facility] for [name] is scheduled for 04/27.    Clinic contact: Raden deFrietas    Patient's next nurse visit for administration: 04/28.    We will follow up with clinic monthly for standard refill processing and delivery.      [name]  Specialty Pharmacy Technician

## 2020-12-12 NOTE — Unmapped (Signed)
Assessment & Plan:  Cindy [name] is a 80 y.o. female with the following diagnoses:   1. Postoperative eye state    2. Senile ectropion, unspecified laterality    3. Dermatochalasis of both upper eyelids    4. Ptosis, both eyelids       POM2 s/p BLTS and BULB  - healing well, lids in good position  - upper lids still ptotic but ptosis surgery was deferred due to concern for corneal exposure in setting of existing exposure (see initial evaluation if needed)    Photos 12/12/20:        Plan:  - continue with lubrication  - observe for now  - RTC prn    Claris Pong, MD/MPH  Oculofacial Plastic & Reconstructive Surgery

## 2020-12-13 MED FILL — INVEGA SUSTENNA 156 MG/ML INTRAMUSCULAR SYRINGE: INTRAMUSCULAR | 28 days supply | Qty: 1 | Fill #9

## 2020-12-14 ENCOUNTER — Institutional Professional Consult (permissible substitution): Admit: 2020-12-14 | Discharge: 2020-12-15 | Payer: MEDICARE

## 2020-12-14 DIAGNOSIS — F259 Schizoaffective disorder, unspecified: Principal | ICD-10-CM

## 2020-12-14 MED ADMIN — paliperidone palmitate (INVEGA SUSTENNA) 156 mg/mL injection 156 mg: 156 mg | INTRAMUSCULAR | @ 15:00:00

## 2020-12-14 NOTE — Unmapped (Signed)
Administered long-acting injectable today, 12/14/2020. See MAR for additional documentation. Meryl Dare, CMA

## 2020-12-15 NOTE — Unmapped (Signed)
Therapist called client's daughter per client request and discussed options for client to engage in therapy session virtually with daughter's assistance. Therapist explained that client stated in the last session that riding in the assisted living facility [facility] makes her nervous and that she wants to do a virtual session from the facility instead. Daughter agreed to assist with MyChart video session and set up appointment with therapist.

## 2020-12-18 DIAGNOSIS — F259 Schizoaffective disorder, unspecified: Principal | ICD-10-CM

## 2020-12-18 NOTE — Unmapped (Signed)
Returned daughter and POA, Cindy [name]'s message. Cindy [name] reports concerns about recent sudden cognitive and neurological changes she has observed including changes in speech, verbal comprehension. Would like to consider change from [place] due to concern for increased risk of stroke associated with medication. Last injection 12/14/2020. Will consider switch to oral Invega and start taper to discontinue when next injection is due. Cindy [name] was started on low dose clonazepam qhs to assist with sleep by primary care with good benefit. Will also consider adding small dose during the day for anxiety management and monitor for symptoms of mania. Cindy [name] is currently waiting on a transfer to a SNF and considering palliative care vs. Hospice referral. Will follow up 01/03/21 to discuss medication change or earlier as needed by patient.

## 2020-12-19 ENCOUNTER — Ambulatory Visit
Admit: 2020-12-19 | Discharge: 2020-12-27 | Disposition: A | Discharge status: Skilled Nursing Facility | Payer: MEDICARE | Source: Intra-hospital

## 2020-12-19 LAB — COMPREHENSIVE METABOLIC PANEL
ALBUMIN: 3.4 g/dL (ref 3.4–5.0)
ALKALINE PHOSPHATASE: 68 U/L (ref 46–116)
ALT (SGPT): 49 U/L (ref 10–49)
ANION GAP: 7 mmol/L (ref 5–14)
AST (SGOT): 75 U/L — ABNORMAL HIGH (ref <=34)
BILIRUBIN TOTAL: 0.8 mg/dL (ref 0.3–1.2)
BLOOD UREA NITROGEN: 36 mg/dL — ABNORMAL HIGH (ref 9–23)
BUN / CREAT RATIO: 51
CALCIUM: 9.7 mg/dL (ref 8.7–10.4)
CHLORIDE: 105 mmol/L (ref 98–107)
CO2: 25 mmol/L (ref 20.0–31.0)
CREATININE: 0.7 mg/dL
EGFR CKD-EPI AA FEMALE: >90 mL/min/{1.73_m2} (ref >=60)
EGFR CKD-EPI NON-AA FEMALE: 82 mL/min/{1.73_m2} (ref >=60)
GLUCOSE RANDOM: 128 mg/dL (ref 70–179)
POTASSIUM: 4.2 mmol/L (ref 3.4–4.8)
PROTEIN TOTAL: 6.5 g/dL (ref 5.7–8.2)
SODIUM: 137 mmol/L (ref 135–145)

## 2020-12-19 LAB — HIGH SENSITIVITY TROPONIN I - SINGLE: HIGH SENSITIVITY TROPONIN I: 18 ng/L (ref <=34)

## 2020-12-19 LAB — APTT
APTT: 27 s (ref 24.9–36.9)
HEPARIN CORRELATION: 0.2

## 2020-12-19 LAB — CBC
HEMATOCRIT: 31.6 % — ABNORMAL LOW (ref 34.0–44.0)
HEMOGLOBIN: 10.8 g/dL — ABNORMAL LOW (ref 11.3–14.9)
MEAN CORPUSCULAR HEMOGLOBIN CONC: 34.3 g/dL (ref 32.0–36.0)
MEAN CORPUSCULAR HEMOGLOBIN: 32.3 pg (ref 25.9–32.4)
MEAN CORPUSCULAR VOLUME: 94.3 fL (ref 77.6–95.7)
MEAN PLATELET VOLUME: 6.3 fL — ABNORMAL LOW (ref 6.8–10.7)
PLATELET COUNT: 214 10*9/L (ref 150–450)
RED BLOOD CELL COUNT: 3.35 10*12/L — ABNORMAL LOW (ref 3.95–5.13)
RED CELL DISTRIBUTION WIDTH: 14.3 % (ref 12.2–15.2)
WBC ADJUSTED: 8.3 10*9/L (ref 3.6–11.2)

## 2020-12-19 LAB — PROTIME-INR
INR: 1.16
PROTIME: 13.6 s — ABNORMAL HIGH (ref 10.3–13.4)

## 2020-12-19 LAB — URINALYSIS WITH CULTURE REFLEX
BILIRUBIN UA: NEGATIVE
GLUCOSE UA: NEGATIVE
LEUKOCYTE ESTERASE UA: NEGATIVE
NITRITE UA: POSITIVE — AB
PH UA: 6 (ref 5.0–9.0)
RBC UA: <1 /HPF (ref <=4)
SPECIFIC GRAVITY UA: 1.02 (ref 1.003–1.030)
SQUAMOUS EPITHELIAL: <1 /HPF (ref 0–5)
UROBILINOGEN UA: 0.2
WBC UA: <1 /HPF (ref 0–5)

## 2020-12-19 LAB — TSH: THYROID STIMULATING HORMONE: 0.641 u[IU]/mL (ref 0.550–4.780)

## 2020-12-19 MED ADMIN — iohexoL (OMNIPAQUE) 350 mg iodine/mL solution 100 mL: 100 mL | INTRAVENOUS | @ 19:00:00 | Stop: 2020-12-19

## 2020-12-19 MED ADMIN — enoxaparin (LOVENOX) syringe 30 mg: 30 mg | SUBCUTANEOUS | @ 22:00:00

## 2020-12-19 MED ADMIN — SMOG ENEMA: 240 mL | RECTAL | @ 21:00:00 | Stop: 2020-12-19

## 2020-12-19 MED ADMIN — lactated ringers bolus 1,000 mL: 1000 mL | INTRAVENOUS | @ 18:00:00 | Stop: 2020-12-19

## 2020-12-19 MED ADMIN — lactated ringers bolus 1,000 mL: 1000 mL | INTRAVENOUS | @ 21:00:00 | Stop: 2020-12-19

## 2020-12-19 NOTE — Unmapped (Signed)
Bed: 23-B  Expected date:   Expected time:   Means of arrival:   Comments:  stroke

## 2020-12-19 NOTE — Unmapped (Signed)
CODE STROKE 80 yo F L sided droop and leaning. VSS, BG 124. ETA 1217 [address]

## 2020-12-19 NOTE — Unmapped (Signed)
[facility]  Emergency Department Provider Note    ED Clinical Impression     Final diagnoses:   Generalized weakness (Primary)   Dehydration   Urinary retention   Fecal impaction (CMS-HCC)     Initial Impression, ED Course, Assessment and Plan     Impression:     Cindy [name] is a 80 y.o. female with a PMH of IBS, HTN, fibromyalgia, spinal stenosis and schizoaffective disorder presenting to the ED as a CODE stroke with SNF staff reporting she has been generally weak today. EMS noted questionable facial droop. LKN 2100 yesterday. Not anticoagulated.    VS unremarkable. Patient is afebrile and satting 94% on RA. On exam, the patient is alert, oriented, and in NAD. On exam, patient is frail-appearing though A&O x3. Cardiopulmonary exam normal. Exam notable for dry MM. Neurology resident noticed some caked food in the mouth. Garbled speech, though suspect that the food and dry mm are contributing to this. No pronator drift. Strength equal and intact in all 4 extremities. Sensation equal and intact in all 4 extremities. No facial droop. Large right ventral hernia that is reducible. Mild diffuse abdominal tenderness. NIH Score 1 (speech).     Differential includes global weakness 2/2 dehydration or underlying electrolyte abnormality vs TIA vs acute CVA vs underlying UTI vs abdominal complication such as obstruction, diverticulitis, or constipation.    Plan for EKG, CT Head, CT A/P, basic labs, coags, and hsTrop.     12:20   CT scanner unavailable. Patient will be roomed until CT availability.    12:35   EKG shows NSR at 86 bpm, no evidence of acute ischemia. Patient transported to CT scanner.    13:43  CT head negative for acute stroke. Does show some hypodensities in left cerebellar hemisphere compatible with remote infarcts. Lab work notable for PT minimally elevated to 13.6. CMP shows Bun of 36, otherwise reassuring. No evidence of AKI or electrolyte derangements. hsTrop negative at 18. CBC unremarkable. APTT WNL.     15:40   Radiology called with concerns of urinary obstruction. Patient received an in-and-out catheterization prior to CT scan and 600 ccs of urine were removed. However, after CT there is still evidence of retained urine, causing concern for obstruction. Also concern for possible obstruction at the hernia site, most likely 2/2 to her urinary obstruction. Discussed findings with patient and daughter at bedside. Have performed manual disimpaction of the stool ball. Patient now undergoing Foley catheter placement. Paged for admission. Will order enema.    15:58   CT read shows large rectal stool ball measuring approximately 10.7 cm in diameter with associated rectal wall thickening, markedly distended urinary bladder, mild bilateral associated hydronephrosis, and multiple dilated air and fluid-filled loops of small bowel, concerning for mechanical small bowel obstruction secondary to a loop of bowel in the ventral body wall hernia.    17:15   Patient admitted to hospitalist, will board in ED until bed availability.           Additional Medical Decision Making     I have reviewed the vital signs and the nursing notes. Labs and radiology results that were available during my care of the patient were independently reviewed by me and considered in my medical decision making.     I staffed the case with the ED attending, Dr. Darryl Nestle.    I independently visualized the EKG tracing.   I independently visualized the radiology images.   I reviewed the patient's prior medical  records that were available for viewing (12/18/20).   I discussed the case with the Neurology consultant.     Portions of this record have been created using Scientist, clinical (histocompatibility and immunogenetics). Dictation errors have been sought, but may not have been identified and corrected.  ____________________________________________       History     Chief Complaint  Stroke Alert      HPI   Cindy [name] is a 80 y.o. female with a PMH of IBS, HTN, fibromyalgia, spinal stenosis and schizoaffective disorder presenting to the ED as CODE STROKE. Patient presents from [facility] with staff reports that she work up at 0730 today and has since appeared generally weaker than usual. She was able to eat breakfast, but staff said she did not appear at baseline. LKN at 2100 yesterday. Patient is not anticoagulated. When EMS arrived today, patient was GCS 15. They noted mild facial droop, but no unilateral weakness. Patient was satting 96% on RA so she was placed on 2L O2. Patient currently endorsese some mild generalized discomfort. Otherwise, she denies any blurred vision.     Past Medical History:   Diagnosis Date   ??? ABMD (anterior basement membrane dystrophy)    ??? Allergic rhinitis    ??? Anxiety    ??? Arthritis    ??? Cataract     Nuclear sclerotic cataract OS, BCVA 20/20    ??? Depression    ??? Dry eyes    ??? Fibromyalgia    ??? Food intolerance    ??? GERD (gastroesophageal reflux disease)    ??? History of migraine     With Aura   ??? IBS (irritable bowel syndrome)    ??? 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 Osteoarthritis    ??? Ptosis     Intermittent ptosis OD   ??? PVD (posterior vitreous detachment), bilateral    ??? Schizoaffective disorder (CMS-HCC)    ??? Sjogren's syndrome (CMS-HCC)    ??? Small intestinal bacterial overgrowth (SIBO)      Patient Active Problem List   Diagnosis   ??? Abnormality of gait   ??? Hypertension, benign   ??? Dermatitis   ??? Goiter, non-toxic   ??? Osteoarthrosis   ??? Osteoporosis   ??? Pain in soft tissues of limb   ??? Neuralgia and neuritis   ??? Restless legs syndrome   ??? Irritable bowel syndrome with both constipation and diarrhea   ??? Food intolerance in adult   ??? Schizoaffective disorder (CMS-HCC)   ??? Chronic headaches   ??? Dystonia   ??? Excessive daytime sleepiness   ??? Fall   ??? Subclinical hyperthyroidism   ??? Lumbosacral pain   ??? Dry eyes due to decreased tear production   ??? Acquired unequal leg length   ??? Seasonal allergic rhinitis due to pollen ??? Anterior basement membrane dystrophy   ??? Valvular heart disease   ??? Spinal stenosis of cervical region   ??? Pes planus   ??? Scoliosis, or kyphoscoliosis, idiopathic   ??? Delirium   ??? Spinal stenosis of lumbar region   ??? Physical deconditioning   ??? Dermatochalasis of both upper eyelids   ??? Senile ectropion   ??? Weakness     Past Surgical History:   Procedure Laterality Date   ??? blepharectomy Bilateral 10/11/2020   ??? CATARACT EXTRACTION Right 02/08/2013   ??? HERNIA REPAIR      x4   ??? HYSTERECTOMY  PARTIAL   ??? OCULOPLASTIC SURGERY Bilateral 10/11/2020    BLEPHAROPLASTY UPPER; W/EXCESS SKIN WT DOWN LID;    ??? PR FIX ECTROPION,ENTENSV LID REPAIR Bilateral 10/11/2020    Procedure: REPAIR OF ECTROPION; EXTENSIVE (EG, TARSAL STRIP OPERATIONS);  Surgeon: Claris Pong, MD;  [facility];  Service: Ophthalmology   ??? PR REV UPPER EYELID W EXCESS SKIN Bilateral 10/11/2020    Procedure: BLEPHAROPLASTY UPPER; W/EXCESS SKIN WT DOWN LID;  Surgeon: Claris Pong, MD;  [facility];  Service: Ophthalmology   ??? SIGMOIDECTOMY     ??? SKIN BIOPSY     ??? TONSILLECTOMY AND ADENOIDECTOMY         Current Facility-Administered Medications:   ???  acetaminophen (TYLENOL) tablet 500 mg, 500 mg, Oral, TID, Anell Barr, MD  ???  acetaminophen (TYLENOL) tablet 650 mg, 650 mg, Oral, Q4H PRN, Anell Barr, MD  ???  aluminum-magnesium hydroxide-simethicone (MAALOX MAX) 80-80-8 mg/mL oral suspension, 30 mL, Oral, Q4H PRN, Anell Barr, MD  ???  amLODIPine (NORVASC) tablet 5 mg, 5 mg, Oral, Daily, Anell Barr, MD  ???  cholecalciferol (vitamin D3 25 mcg (1,000 units)) tablet 50 mcg, 50 mcg, Oral, Daily, Anell Barr, MD  ???  clonazePAM (KlonoPIN) disintegrating tablet 0.25 mg, 0.25 mg, Oral, At bedtime, Anell Barr, MD, 0.25 mg at 12/19/20 2110  ???  diclofenac sodium (VOLTAREN) 1 % gel 4 g, 4 g, Topical, QID, Anell Barr, MD, 4 g at 12/20/20 0626  ???  enoxaparin (LOVENOX) syringe 30 mg, 30 mg, Subcutaneous, Q24H SCH, Anell Barr, MD, 30 mg at 12/19/20 1752  ???  folic acid (FOLVITE) tablet 500 mcg, 500 mcg, Oral, Daily, Anell Barr, MD  ???  gabapentin (NEURONTIN) capsule 100 mg, 100 mg, Oral, Daily, Anell Barr, MD  ???  gabapentin (NEURONTIN) capsule 300 mg, 300 mg, Oral, Nightly, Anell Barr, MD  ???  guaiFENesin (ROBITUSSIN) oral syrup, 200 mg, Oral, Q4H PRN, Anell Barr, MD  ???  hydrocortisone (ANUSOL-HC) suppository 25 mg, 25 mg, Rectal, BID, Anell Barr, MD  ???  melatonin tablet 3 mg, 3 mg, Oral, Nightly PRN, Anell Barr, MD  ???  melatonin tablet 3 mg, 3 mg, Oral, QPM, Anell Barr, MD  ???  ondansetron (ZOFRAN-ODT) disintegrating tablet 4 mg, 4 mg, Oral, Q6H PRN **OR** ondansetron (ZOFRAN) injection 4 mg, 4 mg, Intravenous, Q6H PRN, Anell Barr, MD  ???  sertraline (ZOLOFT) tablet 100 mg, 100 mg, Oral, Daily, Anell Barr, MD    Allergies  Carbamazepine, Other, Amitiza [lubiprostone], Aspirin, Ciprofloxacin, Hctz [hydrochlorothiazide], Prednisone, Raloxifene, Shellfish containing products, Wellbutrin [bupropion hcl], Baclofen, Bismuth subsalicylate, and Escitalopram    Family History   Problem Relation Age of Onset   ??? Cancer Mother         OVARIAN   ??? Depression Mother    ??? Ovarian cancer Mother    ??? Thyroid disease Father    ??? Hypertension Father    ??? Depression Father    ??? Arthritis Father    ??? Heart disease Father    ??? Thyroid disease Sister    ??? Hypertension Sister    ??? Hypertension Maternal Grandmother    ??? Stroke Maternal Grandmother    ??? Hyperlipidemia Daughter    ??? No Known Problems Maternal Grandfather    ??? No Known Problems Paternal Grandmother    ??? No Known Problems Paternal Grandfather    ???  No Known Problems Sister    ??? No Known Problems Brother    ??? No Known Problems Maternal Aunt    ??? No Known Problems Maternal Uncle    ??? No Known Problems Paternal Aunt    ??? No Known Problems Paternal Uncle    ??? No Known Problems Other    ??? Alcohol abuse Neg Hx    ??? COPD Neg Hx    ??? Diabetes Neg Hx    ??? Drug abuse Neg Hx    ??? Hearing loss Neg Hx    ??? Vision loss Neg Hx    ??? Learning disabilities Neg Hx    ??? Melanoma Neg Hx    ??? Basal cell carcinoma Neg Hx    ??? Squamous cell carcinoma Neg Hx    ??? Amblyopia Neg Hx    ??? Blindness Neg Hx    ??? Cataracts Neg Hx    ??? Glaucoma Neg Hx    ??? Macular degeneration Neg Hx    ??? Retinal detachment Neg Hx    ??? Strabismus Neg Hx      Social History  Social History     Tobacco Use   ??? Smoking status: Former Smoker     Years: 30.00     Quit date: 08/19/1993     Years since quitting: 27.3   ??? Smokeless tobacco: Never Used   Vaping Use   ??? Vaping Use: Never used   Substance Use Topics   ??? Alcohol use: Yes     Alcohol/week: 1.0 standard drink     Types: 1 Glasses of wine per week     Comment: <1 / month    ??? Drug use: Not Currently     Review of Systems  Constitutional: Negative for fever.  Eyes: Negative for visual changes.  ENT: Negative for sore throat.  CV: Negative for chest pain.  Resp: Negative for shortness of breath.  GI: Negative for abdominal pain.  GU: Negative for dysuria.  MSK: Negative for back pain.  Derm: Negative for rash.  Neuro: Negative for headaches.    Physical Exam     ED Triage Vitals   Enc Vitals Group      BP       Pulse       SpO2 Pulse       Resp       Temp       Temp src       SpO2       Weight       Height      Constitutional: Appears stated age. Frail appearing though A&O x3.   HEENT: Normocephalic and atraumatic.Conjunctivae clear. No congestion. Dry mucous membranes. Neurology resident noticed some caked food in the mouth. Garbled speech, though suspect that the food and dry MM are contributing to this.  Heme/Lymph/Immuno: No petechiae or bruising  CV: RRR, no murmurs. Symmetric pulses in all extremities.  Resp: Clear to auscultation bilaterally. No wheezes or rhonchii  GI: Large right ventral hernia that is reducible. Mild diffuse abdominal tenderness. Abdomen otherwise soft and non distended. Impacted stool in rectum.  MSK: Normal range of motion in all extremities.  Neuro: No pronator drift. Strength equal and intact in all 4 extremities. Sensation equal and intact in all 4 extremities. No facial droop. Normal speech and language. No gross focal neurologic deficits appreciated.  Skin: Warm, dry and intact.  Psychiatric: Mood appears low.    EKG     NSR  at 86 bpm, normal axis and intervals, no st elevation or depression    Radiology     CT Abdomen Pelvis with IV Contrast ONLY   Final Result   Limited evaluation secondary to minimal intra-abdominal fat and markedly distended bladder and rectum.      1.Large rectal stool ball measuring approximately 10.7 cm in diameter with associated rectal wall thickening, which could represent stercoral colitis.    2.Markedly distended urinary bladder, likely secondary to outlet obstruction in the setting of large rectal stool ball as above. There is mild bilateral associated hydronephrosis.   3.Multiple dilated air and fluid-filled loops of small bowel, concerning for mechanical small bowel obstruction secondary to a loop of bowel in the ventral body wall hernia, possibly exacerbated by markedly distended bladder and rectum.      Additional chronic and incidental findings, as above.      The findings of this study were discussed via telephone with DR. Beronica Lansdale by Dr. Thom Chimes on 12/19/2020 3:27 PM.            CT Head Wo Contrast   Final Result   No acute intracranial abnormalities.             I personally reviewed the images and lab results for the patient.    Attestations     Documentation assistance was provided by Lenward Chancellor, Scribe, on Dec 19, 2020 at 12:24 PM for [facility], DO.     Dec 20, 2020 8:16 AM. Documentation assistance provided by the scribe. I was present during the time the encounter was recorded. The information recorded by the scribe was done at my direction and has been reviewed and validated by me. Alfredia Ferguson, DO         Alfredia Ferguson, MD  Resident  12/20/20 [phone]

## 2020-12-19 NOTE — Unmapped (Signed)
Initial Consult Note        Requesting Attending Physician:  May Faythe Ghee, MD  Service Requesting Consult: Emergency Medicine     Assessment and Plan        Cindy [name] is a 80 y.o.  female w/ a PMHx of HTN, fibromyalgia, spinal stenosis w/ chronic right drop foot, schizoaffective disorder on whom I have been asked by May Faythe Ghee, MD to consult for generalized weakness, decreased arousal called as a code stroke.     #Decreased arrousal w/ generalized weakness and malaise, called as CODE STROKE   Patient LKN when she went to bed at 9PM yesterday (12/18/20). Patient woken up for breakfast by staff this morning around 7:30AM and patient was not at her mental status baseline as she was less awake and interactive as well as had a decreased level of arousal. Patient taken to breakfast and did not eat and has had decreased PO intake over the last couple of days w/ general weakness and malaise. EMS called due to patient being hard to arouse and slumped in wheelchair. Patient in wheelchair at baseline. EMS arrived and called a slight facial droop, however, when arrived at [facility] patient had food that was caked on her left side of her mouth not allowing her to open it as freely. No left facial droop noted on exam at [facility]. Patient w/ mild dysarthria which is her baseline per ALF. Patient wheel-chiar bound at baseline and ALF does her ADLs. NIH 2 for repeat stimulation and decreased arousal as mild dysarthria. Patient presentation most consistent w/ delirium as she has had waxing and waning levels of arousal as well as severe dehydration and decreased PO intake over the course of the last few days as well as an elevated BUN on labs. No focal neurological deficits making stroke unlikely diagnosis. No abnormal movements, no loss of bowel or bladder or tongue biting making seizure unlikely. Picture most consistent w/ enephalopathy picture likely toxic metabolic in nautre in setting of malaise, dehydration, and decreased PO intake. Patient also w/ turbid dark foul smelling urine and would likely need U/A. Patient w/ failure to thrive at nursing home per daughter that is understaffed.   - recommend toxic metabolic workup/infectious w/u per ED (CBC, CMP, Mg and PO4, U/A, CXR)   - recommend fluid bolus as patient is extremely dry on exam w/ decreased PO intake     This patient was seen and discussed with Dr. Lorenso Courier, who agrees with the above assessment and plan.      We will sign off at this time.    Janice Coffin MD   PGY2 Neurology       HPI      Reason for Consult: generalized weakness, decreased arousal called as code stroke     HPI:  Arrival Time: 1213 12/19/20   Date/Time of last known normal (provider): 9:00 yesterday     Time of initial assessment: upon patient arrival  Assessment performed in person at bedside.  Code stroke/BAT code/code IA called?: yes  Time code stroke cancelled (if applicable): 12:30    Time non-contrast CT head was personally reviewed: at time of acquisition    Initial NIHSS total score: 2  ABCD2 score (if NIHSS < 4): 1   Modified Rankin Scale (mRS) pre-stroke: 4 = Moderately severe disability; need for assistance with some basic ADLs (e.g. eating, using toilet, daily hygiene, walking), but not requiring constant nursing care    Eligible for alteplase?: NO: Contraindications -  Time of Onset:  Last known normal is unknown or outside the time window   Time of treatment decision made at: 12:30 today  Alteplase bolus initiated at: N/A   Reason for treatment delay? N/A    Was workup for large vessel occlusion indicated? No:  NIHSS < 6    Was patient referred for endovascular treatment? NO - No proximal occlusion    Eligible for stroke trial?: no    Cindy [name] is a 79 y.o. female w/ a PMHx of HTN, fibromyalgia, spinal stenosis w/ chronic right foot drop, schizoaffective disorder on whom I have been asked by May Faythe Ghee, MD to consult for generalized weakness, decreased arousal called as a code stroke.     Patient lives at [facility].  At baseline patient is wheelchair-bound and has chronic right foot drop as well as chronic dysarthria.  She does not do any of her basic ADLs and she is taking care of body and 40s at the SNF.  Preceding presentation to [facility] patient had several days of decreased p.o. intake of both food and water.  Patient last known normal was 9 PM on 5/2.  Patient was woken up by staff at 730 this morning and patient was not at her normal mental status baseline.  Patient was harder to arouse than normal and was less attentive and interactive than normal.  Patient was taken to breakfast and did not eat as much as she normally does. Shortly after eating patient was slumped over in her wheelchair and required stimulation to arouse. Patient had not abnormal eye movements, no loss of bowel, no loss of bladder, no tongue biting. Or abnormal movements noted by staff. Patient endorsed malaise and generalized weakness. EMS was called as patient had decreased arousal and stated that she had generalized weakness.  GCS was 15.  EMS noted questionable left facial droop.     Patient arrived to [facility] and Glucose was 117, blood pressure 119/72, on no blood thinners, last known normal 9 PM yesterday. No blood thinners. Patient was very thin and had extremely dry mucous membranes.  On exam patient had mild dysarthria as well as required repeat verbal stimulation to wake up and attend.  Patient had no facial droop upon my examination however the left side of her mouth was closed shut with dried food products.  Patient with no gaze deviation, aphasia, or neglect consistent w/ LVO or focal weakness with the exception of chronic right foot drop due to spinal stenosis and is in a right lower extremity foot drop brace.  CT head showed no acute intracranial abnormality patient did have some old hypodensities in the left cerebellum compatible with remote infarcts.  She states that she smoked in the past but is unable to say how much.  She has hypertension. Labs showed elevated BUN to 36, no WBC count. Patient on multiple centrally acting medications at her ALF including: benedryl, zoloft, gabapentin, melatonin.     Stroke risk factors: hypertension, older age (age>65) and smoking.      NIHSS on arrival:  (1a.) Level of Consciousness:  1 = Not alert, but arousable by minor stimulation to obey, answer or respond   (1b.) LOC Questions:  0 = Answers both questions correctly   (1c.) LOC Commands:  0 = Performs both tasks correctly   (2.)   Best Gaze:  0 = Normal   (3.)   Visual:  0 = No visual loss   (4.)   Facial Palsy:  0 = Normal symmetric movement   (5a.) Motor Arm, Left:  0 = No drift, limb holds 90 (or 45) degrees for full 10 seconds   (5b.) Motor Arm, Right:  0 = No drift, limb holds 90 (or 45) degrees for full 10 seconds   (6a.) Motor Leg, Left:  0 = No drift, limb holds 90 (or 45) degrees for full 5 seconds   (6b.) Motor Leg, Right:  0 = No drift, limb holds 90 (or 45) degrees for full 5 seconds   (7.)   Limb Ataxia:  0 = Absent   (8.)   Sensory:  0 = Normal; no sensory loss   (9.)   Best Language:  0 = No aphasia, normal   (10.) Dysarthria:  1 = Mild to moderate; patient slurs at least some words and at worst, can be understood with some difficulty   (11.) Extinction and Inattention:  0 = No abnormality   NIHSS Total Score:  2     ABCD2 TIA Assessment Scale: (DELETE IF NIHSS SCORE ? 4)   Age 70 or above?  YES  +1   SBP > 140 mmHg or DBP > 90 mmHg at Initial Evaluation?  NO   Clinical Features of the TIA:  ??  Unilateral weakness with or without speech impairment?   ??  Speech impairment ONLY without unilateral weakness?     NO  NO   Duration of Symptoms:  ??  ? 60 minutes?   ??  10-59 minutes?     NO  NO   Diabetes Mellitus in the Patient's History?  NO   ABCD2 Total Score:  1            Allergies   Allergen Reactions   ??? Carbamazepine Anaphylaxis, Hives and Other (See Comments)     Other Reaction: Other reaction  Pt has taken flexeril after carbamazapine w/ no reaction.   ??? Other Hives and Other (See Comments)     Uncoded Allergy. Allergen: Shellfish, Other Reaction: shrimp=gi upset   ??? Amitiza [Lubiprostone] Other (See Comments)     Dizziness     ??? Aspirin Other (See Comments)     Ringing in the ears    ??? Ciprofloxacin Other (See Comments)   ??? Hctz [Hydrochlorothiazide]    ??? Prednisone Other (See Comments)   ??? Raloxifene Other (See Comments)     Blurred vision and headaches   ??? Shellfish Containing Products      Other reaction(s): VOMITING   ??? Wellbutrin [Bupropion Hcl] Other (See Comments)     Patient feels tense and awful while taking   ??? Baclofen Nausea Only     Dizziness     ??? Bismuth Subsalicylate Tinnitus   ??? Escitalopram Rash      Current Facility-Administered Medications   Medication Dose Route Frequency Provider Last Rate Last Admin   ??? paliperidone palmitate (INVEGA SUSTENNA) 156 mg/mL injection 156 mg  156 mg Intramuscular Q28 Days Cherene Altes, MD   156 mg at 12/14/20 1129     Current Outpatient Medications   Medication Sig Dispense Refill   ??? acetaminophen (TYLENOL 8 HOUR) 650 MG CR tablet Take 1,300 mg by mouth every eight (8) hours as needed for pain.     ??? amLODIPine (NORVASC) 5 MG tablet Take 1 tablet (5 mg total) by mouth daily. 90 tablet 3   ??? ASHWAGANDHA ROOT EXTRACT,BULK, MISC Take 2 tablets by mouth two (2) times a day. 800 mg per  tablet.     ??? carboxymethylcellulose sodium (THERATEARS) 0.25 % Drop Administer 2 drops to both eyes every two (2) hours as needed. (Patient taking differently: Administer 2 drops to both eyes every two (2) hours as needed (dry eyes).) 10 mL 0   ??? chlorhexidine (PERIDEX) 0.12 % solution 15 mL by Mouth route Two (2) times a day. Rinse and spit out excess. 473 mL 0   ??? cholecalciferol, vitamin D3-50 mcg, 2,000 unit,, 50 mcg (2,000 unit) tablet Take 1 tablet (50 mcg total) by mouth daily.  0   ??? diclofenac sodium (VOLTAREN) 1 % gel Apply 4 g topically Four (4) times a day. 500 g 3   ??? diphenhydrAMINE (BENADRYL) 25 mg capsule Take 25 mg by mouth nightly. Patient reported medication to help her sleep     ??? docusate sodium (COLACE) 100 MG capsule Take 1 capsule (100 mg total) by mouth daily.  0   ??? erythromycin (ROMYCIN) 5 mg/gram (0.5 %) ophthalmic ointment      ??? erythromycin (ROMYCIN) 5 mg/gram (0.5 %) ophthalmic ointment Apply a thin layer to all incisions 3 times a day and inside the lower eyelid at bedtime. 3.5 g 3   ??? gabapentin (NEURONTIN) 100 MG capsule PLEASE SEE ATTACHED FOR DETAILED DIRECTIONS 270 capsule 1   ??? gabapentin (NEURONTIN) 300 MG capsule Take 1-2 pills PO qhs 60 capsule 2   ??? glycerin, adult, Supp Insert 1 suppository into the rectum daily as needed (if bowels are hard.). 15 suppository 1   ??? ketoconazole (NIZORAL) 2 % shampoo      ??? lidocaine HCL (ASPERCREME, LIDOCAINE HCL,) 4 % lqro Apply 1 application topically Three (3) times a day as needed.  0   ??? melatonin 3 mg Tab Take 3 mg by mouth every evening.     ??? olmesartan (BENICAR) 20 MG tablet Take 1 tablet (20 mg total) by mouth daily. 90 tablet 3   ??? paliperidone palmitate (INVEGA SUSTENNA) 156 mg/mL Syrg Inject 1 mL (156 mg total) into the muscle every twenty-eight (28) days for 12 doses. 1 mL 10   ??? penicillin v potassium (VEETID) 500 MG tablet Take 1 tablet (500 mg total) by mouth four (4) times a day. 20 tablet 0   ??? sertraline (ZOLOFT) 100 MG tablet Take 1 tablet (100 mg total) by mouth daily. 90 tablet 0       Past Medical History:   Diagnosis Date   ??? ABMD (anterior basement membrane dystrophy)    ??? Allergic rhinitis    ??? Anxiety    ??? Arthritis    ??? Cataract     Nuclear sclerotic cataract OS, BCVA 20/20    ??? Depression    ??? Dry eyes    ??? Fibromyalgia    ??? Food intolerance    ??? GERD (gastroesophageal reflux disease)    ??? History of migraine     With Aura   ??? IBS (irritable bowel syndrome)    ??? 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 Osteoarthritis    ??? Ptosis     Intermittent ptosis OD ??? PVD (posterior vitreous detachment), bilateral    ??? Schizoaffective disorder (CMS-HCC)    ??? Sjogren's syndrome (CMS-HCC)    ??? Small intestinal bacterial overgrowth (SIBO)        Past Surgical History:   Procedure Laterality Date   ??? blepharectomy Bilateral 10/11/2020   ??? CATARACT EXTRACTION Right 02/08/2013   ??? HERNIA  REPAIR      x4   ??? HYSTERECTOMY      PARTIAL   ??? OCULOPLASTIC SURGERY Bilateral 10/11/2020    BLEPHAROPLASTY UPPER; W/EXCESS SKIN WT DOWN LID;    ??? PR FIX ECTROPION,ENTENSV LID REPAIR Bilateral 10/11/2020    Procedure: REPAIR OF ECTROPION; EXTENSIVE (EG, TARSAL STRIP OPERATIONS);  Surgeon: Claris Pong, MD;  [facility];  Service: Ophthalmology   ??? PR REV UPPER EYELID W EXCESS SKIN Bilateral 10/11/2020    Procedure: BLEPHAROPLASTY UPPER; W/EXCESS SKIN WT DOWN LID;  Surgeon: Claris Pong, MD;  Location: [facility] OR [facility];  Service: Ophthalmology   ??? SIGMOIDECTOMY     ??? SKIN BIOPSY     ??? TONSILLECTOMY AND ADENOIDECTOMY         Social History     Socioeconomic History   ??? Marital status: Single     Spouse name: Not on file   ??? Number of children: Not on file   ??? Years of education: Not on file   ??? Highest education level: Not on file   Occupational History   ??? Not on file   Tobacco Use   ??? Smoking status: Former Smoker     Years: 30.00     Quit date: 08/19/1993     Years since quitting: 27.3   ??? Smokeless tobacco: Never Used   Vaping Use   ??? Vaping Use: Never used   Substance and Sexual Activity   ??? Alcohol use: Yes     Alcohol/week: 1.0 standard drink     Types: 1 Glasses of wine per week     Comment: <1 / month    ??? Drug use: Not Currently   ??? Sexual activity: Not Currently   Other Topics Concern   ??? Do you use sunscreen? Yes   ??? Tanning bed use? No   ??? Are you easily burned? No   ??? Excessive sun exposure? No   ??? Blistering sunburns? No   Social History Narrative    Lives alone with her dog, [place].  Daughter in [place], sister in [place].  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     Social Determinants of Health     Financial Resource Strain: Unknown   ??? Difficulty of Paying Living Expenses: Patient refused   Food Insecurity: No Food Insecurity   ??? Worried About Running Out of Food in the Last Year: Never true   ??? Ran Out of Food in the Last Year: Never true   Transportation Needs: No Transportation Needs   ??? Lack of Transportation (Medical): No   ??? Lack of Transportation (Non-Medical): No   Physical Activity: Not on file   Stress: Not on file   Social Connections: Not on file     Family History   Problem Relation Age of Onset   ??? Cancer Mother         OVARIAN   ??? Depression Mother    ??? Ovarian cancer Mother    ??? Thyroid disease Father    ??? Hypertension Father    ??? Depression Father    ??? Arthritis Father    ??? Heart disease Father    ??? Thyroid disease Sister    ??? Hypertension Sister    ??? Hypertension Maternal Grandmother    ??? Stroke Maternal Grandmother    ??? Hyperlipidemia Daughter    ??? No Known Problems Maternal Grandfather    ???  No Known Problems Paternal Grandmother    ??? No Known Problems Paternal Grandfather    ??? No Known Problems Sister    ??? No Known Problems Brother    ??? No Known Problems Maternal Aunt    ??? No Known Problems Maternal Uncle    ??? No Known Problems Paternal Aunt    ??? No Known Problems Paternal Uncle    ??? No Known Problems Other    ??? Alcohol abuse Neg Hx    ??? COPD Neg Hx    ??? Diabetes Neg Hx    ??? Drug abuse Neg Hx    ??? Hearing loss Neg Hx    ??? Vision loss Neg Hx    ??? Learning disabilities Neg Hx    ??? Melanoma Neg Hx    ??? Basal cell carcinoma Neg Hx    ??? Squamous cell carcinoma Neg Hx    ??? Amblyopia Neg Hx    ??? Blindness Neg Hx    ??? Cataracts Neg Hx    ??? Glaucoma Neg Hx    ??? Macular degeneration Neg Hx    ??? Retinal detachment Neg Hx    ??? Strabismus Neg Hx      Code Status: Full Code     Review of Systems     A 12-system review of systems was conducted and was negative except as documented above in the HPI. Objective        Temp:  [37.1 ??C] 37.1 ??C  Heart Rate:  [89] 89  Resp:  [20] 20  BP: (113-119)/(49-72) 119/72  MAP (mmHg):  [87] 87  SpO2:  [94 %] 94 %  No intake/output data recorded.    Physical Exam:  General Appearance:Chronically ill appearing. In no acute distress. Thin.  HEENT: Head is atraumatic and normocephalic. Sclera anicteric without injection. Oropharyngeal membranes are moist with no erythema or exudate. Extremely dry mucous membranes, crusted food w/ her mouth closed shut on the left.   Neck: Deferred.  Lungs: Normal work of breathing.  Heart: Regular rate and rhythm. No murmurs, rubs, or gallops.  Abdomen: Soft, nontender, nondistended.  Extremities: No clubbing, cyanosis, or edema.    Neurological Examination:   Mental Status: Requires repeated minor stimulation to attend but once awake is conversational. Speech had mild dysarthria, no word finding pauses, mild dysarthria, no paraphasic errors. Comprehension was intact. Memory for recent and remote events was intact. AAOx3, not fully alert to situation. Follows all commands. Able to spell world backwards.    Cranial Nerves: PERRL. Pursuit eye movements were uninterrupted with full range and without more than end-gaze nystagmus. Facial sensation intact bilaterally to light touch in all three divisions of CNV. Face symmetric at rest. Normal facial movement bilaterally, including forehead, eye closure and grimace/smile. Patient w/ food crusted over left side of mouth, once cleaned off, normal movement of left side of lower face.  Hearing intact to conversation. Shoulder shrug full strength bilaterally. Palate movement is symmetric. Tongue protrudes midline and tongue movements are normal.     Motor Exam:   Decreased bulk throughout.   No myoclonus, or other adventitious movement.  Intention tremor in BL UE w/ movement.   Pronator drift is absent.  RUE: 4+ throughout, good grip strength  LUE: 4+ throughout, good grip strength  RLE: 4+ throughout w/ exception of 0/5 w/ ankle dorsiflexion 2/2 chronic dropfoot, brace present   LLE: 4+ throughout    Reflexes: Deferred reflexes. Toes are downgoing on LLE, UTA on RLE 2/2  footdrop brace.     Sensory:   Sensation normal to light touch and pinprick in both hands and both feet, decreased vibration to the right shin below the level of the mid-shin.     Cerebellar/Coordination/Gait: Rapid alternating movements are normal in bilateral upper extremities. Finger-to-nose is normal without ataxia or dysmetria bilaterally. Heel-to-shin is normal without ataxia or dysmetria bilaterally.       Diagnostic Studies      All Labs Last 24hrs:   Recent Results (from the past 24 hour(s))   POCT Glucose    Collection Time: 12/19/20 12:25 PM   Result Value Ref Range    Glucose, POC 117 70 - 179 mg/dL   CBC    Collection Time: 12/19/20 12:27 PM   Result Value Ref Range    WBC 8.3 3.6 - 11.2 10*9/L    RBC 3.35 (L) 3.95 - 5.13 10*12/L    HGB 10.8 (L) 11.3 - 14.9 g/dL    HCT 16.1 (L) 09.6 - 44.0 %    MCV 94.3 77.6 - 95.7 fL    MCH 32.3 25.9 - 32.4 pg    MCHC 34.3 32.0 - 36.0 g/dL    RDW 04.5 40.9 - 81.1 %    MPV 6.3 (L) 6.8 - 10.7 fL    Platelet 214 150 - 450 10*9/L   ECG 12 Lead    Collection Time: 12/19/20 12:30 PM   Result Value Ref Range    EKG Systolic BP  mmHg    EKG Diastolic BP  mmHg    EKG Ventricular Rate 86 BPM    EKG Atrial Rate 86 BPM    EKG P-R Interval 142 ms    EKG QRS Duration 86 ms    EKG Q-T Interval 370 ms    EKG QTC Calculation 442 ms    EKG Calculated P Axis 59 degrees    EKG Calculated R Axis -7 degrees    EKG Calculated T Axis 57 degrees    QTC Fredericia 417 ms       EXAM: Computed tomography, head or brain without contrast material.  DATE: 12/19/2020  ACCESSION: [id number]  DICTATED: 12/19/2020 12:49 PM  INTERPRETATION LOCATION: [facility]  ??  CLINICAL INDICATION: 80 years old Female with stroke    ??  COMPARISON: CT head 10/26/2020  ??  TECHNIQUE: Axial CT images of the head  from skull base to vertex without contrast.  ??  FINDINGS:   There are scattered and confluent hypodense foci within the periventricular and deep white matter.  These are nonspecific but commonly associated with small vessel ischemic changes. Similar-appearing hypodensities in the left cerebellar hemisphere, compatible with remote infarcts. There is no midline shift. No mass lesion. There is no evidence of acute infarct. No acute intracranial hemorrhage. No fractures are evident. The sinuses are pneumatized.  ??  IMPRESSION:  No acute intracranial abnormalities.

## 2020-12-20 LAB — BASIC METABOLIC PANEL
ANION GAP: 9 mmol/L (ref 5–14)
BLOOD UREA NITROGEN: 22 mg/dL (ref 9–23)
BUN / CREAT RATIO: 42
CALCIUM: 9.2 mg/dL (ref 8.7–10.4)
CHLORIDE: 106 mmol/L (ref 98–107)
CO2: 25 mmol/L (ref 20.0–31.0)
CREATININE: 0.52 mg/dL — ABNORMAL LOW
EGFR CKD-EPI AA FEMALE: >90 mL/min/{1.73_m2} (ref >=60)
EGFR CKD-EPI NON-AA FEMALE: >90 mL/min/{1.73_m2} (ref >=60)
GLUCOSE RANDOM: 116 mg/dL (ref 70–179)
POTASSIUM: 4 mmol/L (ref 3.4–4.8)
SODIUM: 140 mmol/L (ref 135–145)

## 2020-12-20 LAB — MAGNESIUM: MAGNESIUM: 2.4 mg/dL (ref 1.6–2.6)

## 2020-12-20 LAB — CBC
HEMATOCRIT: 30.1 % — ABNORMAL LOW (ref 34.0–44.0)
HEMOGLOBIN: 10.3 g/dL — ABNORMAL LOW (ref 11.3–14.9)
MEAN CORPUSCULAR HEMOGLOBIN CONC: 34.3 g/dL (ref 32.0–36.0)
MEAN CORPUSCULAR HEMOGLOBIN: 32.2 pg (ref 25.9–32.4)
MEAN CORPUSCULAR VOLUME: 94.1 fL (ref 77.6–95.7)
MEAN PLATELET VOLUME: 6.3 fL — ABNORMAL LOW (ref 6.8–10.7)
PLATELET COUNT: 198 10*9/L (ref 150–450)
RED BLOOD CELL COUNT: 3.2 10*12/L — ABNORMAL LOW (ref 3.95–5.13)
RED CELL DISTRIBUTION WIDTH: 14.1 % (ref 12.2–15.2)
WBC ADJUSTED: 8.5 10*9/L (ref 3.6–11.2)

## 2020-12-20 LAB — HEPATIC FUNCTION PANEL
ALBUMIN: 2.8 g/dL — ABNORMAL LOW (ref 3.4–5.0)
ALKALINE PHOSPHATASE: 58 U/L (ref 46–116)
ALT (SGPT): 44 U/L (ref 10–49)
AST (SGOT): 60 U/L — ABNORMAL HIGH (ref <=34)
BILIRUBIN DIRECT: 0.2 mg/dL (ref 0.00–0.30)
BILIRUBIN TOTAL: 0.6 mg/dL (ref 0.3–1.2)
PROTEIN TOTAL: 5.5 g/dL — ABNORMAL LOW (ref 5.7–8.2)

## 2020-12-20 LAB — PHOSPHORUS: PHOSPHORUS: 3.5 mg/dL (ref 2.4–5.1)

## 2020-12-20 MED ADMIN — cefTRIAXone (ROCEPHIN) 1 g in sodium chloride 0.9 % (NS) 100 mL IVPB-connector bag: 1 g | INTRAVENOUS | @ 16:00:00 | Stop: 2020-12-20

## 2020-12-20 MED ADMIN — sodium chloride (NS) 0.9 % infusion: 75 mL/h | INTRAVENOUS | @ 17:00:00

## 2020-12-20 MED ADMIN — lactated Ringers infusion: 75 mL/h | INTRAVENOUS | @ 14:00:00 | Stop: 2020-12-20

## 2020-12-20 MED ADMIN — enoxaparin (LOVENOX) syringe 30 mg: 30 mg | SUBCUTANEOUS | @ 14:00:00

## 2020-12-20 MED ADMIN — diclofenac sodium (VOLTAREN) 1 % gel 4 g: 4 g | TOPICAL | @ 22:00:00

## 2020-12-20 MED ADMIN — diclofenac sodium (VOLTAREN) 1 % gel 4 g: 4 g | TOPICAL | @ 16:00:00

## 2020-12-20 MED ADMIN — diclofenac sodium (VOLTAREN) 1 % gel 4 g: 4 g | TOPICAL | @ 10:00:00

## 2020-12-20 MED ADMIN — clonazePAM (KlonoPIN) disintegrating tablet 0.25 mg: .25 mg | ORAL | @ 01:00:00

## 2020-12-20 NOTE — Unmapped (Signed)
Daily Progress Note    Assessment/Plan:    Principal Problem:    Weakness  Active Problems:    Abnormality of gait    Hypertension, benign    Irritable bowel syndrome with both constipation and diarrhea  Resolved Problems:    * No resolved hospital problems. *        Wound 12/19/20 Coccyx (Active)   Wound Image   12/20/20 0800   Dressing Status      Soiled;Removed 12/20/20 0800   Wound Length (cm) 8 cm 12/20/20 0800   Wound Width (cm) 7 cm 12/20/20 0800   Wound Surface Area (cm^2) 56 cm^2 12/20/20 0800   Wound Bed Purple/maroon discoloration;Non-blanchable erythema;Pink 12/20/20 0800   Odor None 12/20/20 0800   Peri-wound Assessment      Clean;Dry;Intact;Blanchable erythema;Non-blanchable erythema 12/20/20 0800   Exudate Amnt      None 12/20/20 0800   Treatments Cleansed/Irrigation;Zinc based products 12/20/20 0800           Cindy [name] is a 80 y.o. female who presented to [facility] with Weakness.    ??  Weakness: Possibly in the setting of genitourinary infection.  Initially concerning for stroke given report of facial weakness still in facility.  Head CT here and evaluation by neurology shows no evidence for stroke. However she is severely debilitated, and according to her daughter this is worsening.  No altered mental status at time of admission. Chest XR did show opacity which may represent atelectasis, aspiration, or infection. Low suspicion for respiratory infection as patient is on room air, and without respiratory complaints. No leukocytosis. TSH normal. There was concern for Parkinsonism given her very soft voice and what appeared to be may be some cogwheeling on initial exam. Evaluated by SLP, no concern for aspiration. Per SLP, decreased voice intensity likely secondary to strength and endurance deficits. Recommended soft and bite sized diet (on pureed at ALF) and thin liquids.  - will order continuous IVF NS 75 ml/hr pending improved PO intake  - PT/OT recommending 5x low, weekly  -Appreciate neurology's assistance.  Perhaps consider potential further evaluation for parkinsonism?  -Case management following for discharge planning to SNF     E. Coli UTI: UA negative for pyuria, though urine culture showing 50,000 to 100,000 CFU/mL Escherichia coli.  Though possibly colonization, in the setting of urinary retention with outlet obstruction requiring catheterization, worsening malaise, fatigue, and encephalopathy, will treat empirically for E. Coli UTI.  - will start a 5-day course of IV Ceftriaxone??  -Continue Foley decompression for urinary retention likely related to stool ball/constipation. Once constipation/stool ball resolved, can then attempt voiding trial.    Constipation: CT abd/pelv obtained in ED showed multiple dilated loops of air and fluid-filled small bowel, concerning for mechanical obstruction. Large rectal stool ball with associated rectal wall thickening. Manual disimpaction performed in ED. Given SMOG enema x 1. KUB today redemonstrated very large rectal stool ball with upstream dilatation of large and small bowel diffusely, similar to previous day's CT exam. Low suspicion for bowel obstruction with ongoing stooling.  Attempted bedside disimpaction again today, though unsuccessful.  - will repeat SMOG enema for rectal stool ball  - will consider starting Metamucil daily tomorrow    Stage II sacral decubitus wound present on admission:  -Local wound care, WOCN following weekly.  ??  Hemorrhoids:  -continue Hydrocortisone suppository BID    Normocytic anemia: Per history, hemoglobin 10.8 at time of admission, previously 11.1 in 10/2020. No overt signs of bleeding. Possibly  anemia of chronic disease.  - will check iron panel, Vitamin B12, and folate serum  ??  Hypertension: Given elevated BUN and risk for acute kidney injury, will continue to hold home ARB for the time being. Blood pressures have remained stable.   ??  Schizoaffective disorder:  In review of chart, was transitioning off paliperidone.  Has been started on low-dose clonazepam by PCP, not psychiatrist.  -For now, continue low-dose clonazepam. Consider discontinuation for ongoing sedation.   ??  Degenerative joint disease:  -For now, continue home gabapentin. Consider discontinuation for ongoing sedation.   ??  DVT prophylaxis: Enoxaparin  ??  Anticipated disposition:  Previously at ALF, though family previously pursuing SNF placement prior to admission in the setting of disconditioning. PT/OT recs pending, though will likely need SNF placement once medically clear. CM following.   ??  Code Status: Full Code  ??  Admission Status:  Inpatient as expected to require a two-midnight stay for further work-up and treatment.  ___________________________________________________________________    Subjective:  Patient seen and examined. No acute events overnight. Patient drowsy, yet arousable. Attempted to open eyes when prompted. When asked if she was having pain, she stated hemorrhoids. Denied any chest discomfort or SOB. Able to accurately answer questions, though often difficult to discern answers with soft voice. Large, loose bowel movement noted on pad during evaluation. Daughter at bedside later in afternoon, and updated on plan of care. Patient appears in no acute distress.     Recent Results (from the past 24 hour(s))   POCT Glucose    Collection Time: 12/19/20 12:25 PM   Result Value Ref Range    Glucose, POC 117 70 - 179 mg/dL   CBC    Collection Time: 12/19/20 12:27 PM   Result Value Ref Range    WBC 8.3 3.6 - 11.2 10*9/L    RBC 3.35 (L) 3.95 - 5.13 10*12/L    HGB 10.8 (L) 11.3 - 14.9 g/dL    HCT 14.7 (L) 82.9 - 44.0 %    MCV 94.3 77.6 - 95.7 fL    MCH 32.3 25.9 - 32.4 pg    MCHC 34.3 32.0 - 36.0 g/dL    RDW 56.2 13.0 - 86.5 %    MPV 6.3 (L) 6.8 - 10.7 fL    Platelet 214 150 - 450 10*9/L   Comprehensive Metabolic Panel    Collection Time: 12/19/20 12:27 PM   Result Value Ref Range    Sodium 137 135 - 145 mmol/L    Potassium 4.2 3.4 - 4.8 mmol/L Chloride 105 98 - 107 mmol/L    Anion Gap 7 5 - 14 mmol/L    CO2 25.0 20.0 - 31.0 mmol/L    BUN 36 (H) 9 - 23 mg/dL    Creatinine 7.84 6.96 - 0.80 mg/dL    BUN/Creatinine Ratio 51     EGFR CKD-EPI Non-African American, Female 82 >=60 mL/min/1.39m2    EGFR CKD-EPI African American, Female >90 >=60 mL/min/1.41m2    Glucose 128 70 - 179 mg/dL    Calcium 9.7 8.7 - 29.5 mg/dL    Albumin 3.4 3.4 - 5.0 g/dL    Total Protein 6.5 5.7 - 8.2 g/dL    Total Bilirubin 0.8 0.3 - 1.2 mg/dL    AST 75 (H) <=28 U/L    ALT 49 10 - 49 U/L    Alkaline Phosphatase 68 46 - 116 U/L   PT-INR    Collection Time: 12/19/20 12:27 PM   Result Value  Ref Range    PT 13.6 (H) 10.3 - 13.4 sec    INR 1.16    APTT    Collection Time: 12/19/20 12:27 PM   Result Value Ref Range    APTT 27.0 24.9 - 36.9 sec    Heparin Correlation 0.2    hsTroponin I (single, no delta)    Collection Time: 12/19/20 12:27 PM   Result Value Ref Range    hsTroponin I 18 <=34 ng/L   TSH    Collection Time: 12/19/20 12:27 PM   Result Value Ref Range    TSH 0.641 0.550 - 4.780 uIU/mL   ECG 12 Lead    Collection Time: 12/19/20 12:30 PM   Result Value Ref Range    EKG Systolic BP  mmHg    EKG Diastolic BP  mmHg    EKG Ventricular Rate 86 BPM    EKG Atrial Rate 86 BPM    EKG P-R Interval 142 ms    EKG QRS Duration 86 ms    EKG Q-T Interval 370 ms    EKG QTC Calculation 442 ms    EKG Calculated P Axis 59 degrees    EKG Calculated R Axis -7 degrees    EKG Calculated T Axis 57 degrees    QTC Fredericia 417 ms   Urinalysis with Culture Reflex    Collection Time: 12/19/20  2:35 PM    Specimen: Catheterized-In and Out Catheter; Urine   Result Value Ref Range    Color, UA Yellow     Clarity, UA Clear     Specific Gravity, UA 1.020 1.003 - 1.030    pH, UA 6.0 5.0 - 9.0    Leukocyte Esterase, UA Negative Negative    Nitrite, UA Positive (A) Negative    Protein, UA Trace (A) Negative    Glucose, UA Negative Negative    Ketones, UA Trace (A) Negative    Urobilinogen, UA 0.2 mg/dL Bilirubin, UA Negative Negative    Blood, UA Small (A) Negative    RBC, UA <1 <=4 /HPF    WBC, UA <1 0 - 5 /HPF    Squam Epithel, UA <1 0 - 5 /HPF    Bacteria, UA Few (A) None Seen /HPF   Urine Culture    Collection Time: 12/19/20  2:35 PM    Specimen: Catheterized-In and Out Catheter; Urine   Result Value Ref Range    Urine Culture, Comprehensive 50,000 to 100,000 CFU/mL Escherichia coli (A)    COVID-19 PCR    Collection Time: 12/19/20  3:55 PM    Specimen: Nasopharyngeal Swab   Result Value Ref Range    SARS-CoV-2 PCR Negative Negative   Basic metabolic panel    Collection Time: 12/20/20  3:50 AM   Result Value Ref Range    Sodium 140 135 - 145 mmol/L    Potassium 4.0 3.4 - 4.8 mmol/L    Chloride 106 98 - 107 mmol/L    CO2 25.0 20.0 - 31.0 mmol/L    Anion Gap 9 5 - 14 mmol/L    BUN 22 9 - 23 mg/dL    Creatinine 0.86 (L) 0.60 - 0.80 mg/dL    BUN/Creatinine Ratio 42     EGFR CKD-EPI Non-African American, Female >90 >=60 mL/min/1.65m2    EGFR CKD-EPI African American, Female >90 >=60 mL/min/1.65m2    Glucose 116 70 - 179 mg/dL    Calcium 9.2 8.7 - 57.8 mg/dL   Magnesium Level    Collection Time: 12/20/20  3:50 AM   Result Value Ref Range    Magnesium 2.4 1.6 - 2.6 mg/dL   CBC    Collection Time: 12/20/20  3:50 AM   Result Value Ref Range    WBC 8.5 3.6 - 11.2 10*9/L    RBC 3.20 (L) 3.95 - 5.13 10*12/L    HGB 10.3 (L) 11.3 - 14.9 g/dL    HCT 09.8 (L) 11.9 - 44.0 %    MCV 94.1 77.6 - 95.7 fL    MCH 32.2 25.9 - 32.4 pg    MCHC 34.3 32.0 - 36.0 g/dL    RDW 14.7 82.9 - 56.2 %    MPV 6.3 (L) 6.8 - 10.7 fL    Platelet 198 150 - 450 10*9/L   Hepatic Function Panel    Collection Time: 12/20/20  3:50 AM   Result Value Ref Range    Albumin 2.8 (L) 3.4 - 5.0 g/dL    Total Protein 5.5 (L) 5.7 - 8.2 g/dL    Total Bilirubin 0.6 0.3 - 1.2 mg/dL    Bilirubin, Direct 1.30 0.00 - 0.30 mg/dL    AST 60 (H) <=86 U/L    ALT 44 10 - 49 U/L    Alkaline Phosphatase 58 46 - 116 U/L   Phosphorus Level    Collection Time: 12/20/20  3:50 AM Result Value Ref Range    Phosphorus 3.5 2.4 - 5.1 mg/dL   POCT Glucose    Collection Time: 12/20/20  7:44 AM   Result Value Ref Range    Glucose, POC 120 70 - 179 mg/dL   POCT Glucose    Collection Time: 12/20/20 11:51 AM   Result Value Ref Range    Glucose, POC 115 70 - 179 mg/dL     Labs/Studies:  Labs and Studies from the last 24hrs per EMR and Reviewed    Objective:  Temp:  [36.3 ??C (97.3 ??F)-36.8 ??C (98.2 ??F)] 36.8 ??C (98.2 ??F)  Heart Rate:  [67-97] 80  Resp:  [13-22] 17  BP: (122-169)/(67-90) 123/70  SpO2:  [93 %-99 %] 96 %    General: Chronically ill, cachetic, lethargic, lying on left side with eyes closed.   Skin: Pallor, Warm, dry, and intact, with partially viewed pressure injury on coccyx.  Head: Normocephalic and atraumatic without tenderness, visible or palpable masses, hair normal texture.  Eyes: PERRLA, Conjunctivae clear without exudates or hemorrhage. Unable to participate with EOM's, Eyelids normal in appearance without swelling or lesions.  Neck: Supple without adenopathy, trachea midline, no carotid bruit or JVD  Cardiac: Heart rate and rhythm normal, no murmurs, gallops or rubs, no edema in extremities, 2+ radial and 2+ pedal pulses bilaterally  Psychiatric: appropriate mood and affect  Neurological: Awake, alert and oriented to person, place, and time with normal speech. Muscle strength 5/5, sensation intact. Cranial nerves are intact, no gait abnormalities.   GU: indwelling foley catheter with amber colored urine  GI: Abdomen soft and symmetric without distention, bowel sounds present and normoactive in all four quadrants. No wincing or grimacing with palpation.   Respiratory: chest wall symmetric and without deformity, no respiratory distress, lung sounds CTA, without rales, rhonchi, or wheezes.

## 2020-12-20 NOTE — Unmapped (Signed)
Care Management  Initial Transition Planning Assessment              General  Care Manager assessed the patient by : Telephone conversation with family, Medical record review  Orientation Level: Oriented X4  Functional level prior to admission: Partially Assisted  Reason for referral: Discharge Planning    Contact/Decision Maker  Extended Emergency Contact Information  Primary Emergency Contact: Arlester Marker [place]  Home Phone: [phone]  Relation: Daughter  Preferred language: ENGLISH  Interpreter needed? No  Secondary Emergency Contact: Thurston,Steve (Son In [place])   [place]  Home Phone: [phone]  Relation: Other  Preferred language: ENGLISH  Interpreter needed? No    Legal Next of Kin / Guardian / POA / Advance Directives     HCDM, First AlternateShonica, [place] - Daughter - [phone]    HCDM, Second Alternate: Virl Axe (Son In [employer]) - Other - [phone]    Advance Directive (Medical Treatment)  Does patient have an advance directive covering medical treatment?: Patient has advance directive covering medical treatment, copy not in chart.    Health Care Decision Maker [HCDM] (Medical & Mental Health Treatment)  Healthcare Decision Maker: HCDM documented in the HCDM/Contact Info section. Denzil Magnuson, daughter)  Information offered on HCDM, Medical & Mental Health advance directives:: Patient given information.    Advance Directive (Mental Health Treatment)  Does patient have an advance directive covering mental health treatment?: Patient has advance directive covering mental health treatment, copy not in chart.    Patient Information  Lives with: Other (Comment) (ALF, [facility].)    Type of Residence: ALF (Assisted living facility) [facility] to care for [facility] pt.)  Facility (Name/Phone #): [place] [phone]  Return to facility?: No       Support Systems/Concerns: Family Members, Church/Faith Community    Responsibilities/Dependents at home?: No    Home Care services in place prior to admission?: No                          Currently receiving outpatient dialysis?: No       Financial Information       Need for financial assistance?: No       Social Determinants of Health  Social Determinants of Health     Tobacco Use: Medium Risk   ??? Smoking Tobacco Use: Former Smoker   ??? Smokeless Tobacco Use: Never Used   Alcohol Use: Not At Risk   ??? How often do you have a drink containing alcohol?: Monthly or less   ??? How many drinks containing alcohol do you have on a typical day when you are drinking?: 1 - 2   ??? How often do you have 5 or more drinks on one occasion?: Never   Financial Resource Strain: Unknown   ??? Difficulty of Paying Living Expenses: Patient refused   Food Insecurity: No Food Insecurity   ??? Worried About Running Out of Food in the Last Year: Never true   ??? Ran Out of Food in the Last Year: Never true   Transportation Needs: No Transportation Needs   ??? Lack of Transportation (Medical): No   ??? Lack of Transportation (Non-Medical): No   Physical Activity: Not on file   Stress: Not on file   Social Connections: Not on file   Intimate Partner Violence: Not on file   Depression: Not at risk   ??? PHQ-2 Score: 2   Housing/Utilities:  Low Risk    ??? Within the past 12 months, have you ever stayed: outside, in a car, in a tent, in an overnight shelter, or temporarily in someone else's home (i.e. couch-surfing)?: No   ??? Are you worried about losing your housing?: No   ??? Within the past 12 months, have you been unable to get utilities (heat, electricity) when it was really needed?: No   Substance Use: Not on file   Health Literacy: Not on file       Discharge Needs Assessment  Concerns to be Addressed: discharge planning    Clinical Risk Factors: > 65, Functional Limitations, History of Falls    Barriers to taking medications: No    Prior overnight hospital stay or ED visit in last 90 days: Yes    Readmission Within the Last 30 Days: previous discharge plan unsuccessful         Anticipated Changes Related to Illness: inability to care for self    Equipment Needed After Discharge: none    Discharge Facility/Level of Care Needs: nursing facility, skilled    Readmission  Risk of Unplanned Readmission Score: UNPLANNED READMISSION SCORE: 23%  Predictive Model Details          23% (High)  Factor Value    Calculated 12/20/2020 08:03 28% Number of active Rx orders 46    [place] Risk of Unplanned Readmission Model 10% Number of ED visits in last six months 2     9% Number of hospitalizations in last year 2     8% Active antipsychotic Rx order present     8% ECG/EKG order present in last 6 months     7% Latest BUN high (36 mg/dL)     7% Encounter of ten days or longer in last year present     6% Imaging order present in last 6 months     6% Age 80     5% Latest hemoglobin low (10.3 g/dL)     4% Active anticoagulant Rx order present     2% Future appointment scheduled     1% Current length of stay 0.619 days      Readmitted Within the Last 30 Days? (No if blank)   Patient at risk for readmission?: Yes  Type of Residence: Mailing Address:  [facility]  [address]  [place] [address]  Contacts:    Patient Phone Number: Extended Emergency Contact Information  Primary Emergency Contact: Johnisha, Louks [place]  Home Phone: [phone]  Relation: Daughter  Preferred language: ENGLISH  Interpreter needed? No  Secondary Emergency Contact: Thurston,Steve (Son In [place])   [place]  Home Phone: [phone]  Relation: Other  Preferred language: ENGLISH  Interpreter needed? No        Medical Provider(s): [facility]  Reason for Admission: Admitting Diagnosis:  No admission diagnoses are documented for this encounter.  Past Medical History:   has a past medical history of ABMD (anterior basement membrane dystrophy), Allergic rhinitis, Anxiety, Arthritis, Cataract, Depression, Dry eyes, Fibromyalgia, Food intolerance, GERD (gastroesophageal reflux disease), History of migraine, IBS (irritable bowel syndrome), Lagophthalmos, Osteoarthritis, Ptosis, PVD (posterior vitreous detachment), bilateral, Schizoaffective disorder (CMS-HCC), Sjogren's syndrome (CMS-HCC), and Small intestinal bacterial overgrowth (SIBO).  Past Surgical History:   has a past surgical history that includes Hernia repair; Sigmoidectomy; Hysterectomy; Cataract extraction (Right, 02/08/2013); Skin biopsy; Tonsillectomy and adenoidectomy; pr rev upper eyelid w excess skin (Bilateral, 10/11/2020); pr fix ectropion,entensv  lid repair (Bilateral, 10/11/2020); blepharectomy (Bilateral, 10/11/2020); and Oculoplastic Surgery (Bilateral, 10/11/2020).   Previous admit date: 02/04/2020    Primary Insurance- Payor: AETNA MEDICARE ADV / Plan: AETNA MEDICARE ADVANTAGE / Product Type: *No Product type* /   Secondary Insurance ??? Secondary Insurance  MEDICAID [place]  Prescription Coverage ??? Yes  Preferred Pharmacy - [facility]  [facility] [place], [place] - [address]  [place] [facility]  [facility] [phone] - [place], [place] - [address] [place] [address], [address]    Transportation home: [facility]        Discharge Plan  Screen findings are: Discharge planning needs identified or anticipated (Comment). (Will likely need SNF placement)    Expected Discharge Date:     Expected Transfer from Critical Care:      Quality data for continuing care services shared with patient and/or representative?: N/A  Patient and/or family were provided with choice of facilities / services that are available and appropriate to meet post hospital care needs?: Yes       Initial Assessment complete?: Yes

## 2020-12-20 NOTE — Unmapped (Signed)
[facility] History and Physical    Primary Care Physician: [facility]  Other physicians: Robert Bellow [facility] Psychiatry    Assessment/Plan:   Principal Problem:    Weakness  Active Problems:    Abnormality of gait    Hypertension, benign    Irritable bowel syndrome with both constipation and diarrhea  Resolved Problems:    * No resolved hospital problems. *         *Weakness.  Initially concerning for stroke given report of facial weakness still in facility.  Head CT here and evaluation by neurology shows no evidence for stroke.  However she is severely debilitated, and according to her daughter this is worsening.  She does not appear to have any altered mental status, but her urinary retention and constipation are not helping.  Also given her very soft voice and what appears to be may be some cogwheeling on exam, I am wondering if she has some parkinsonism.  She is definitely dehydrated.  -We will treat stool ball with at least one enema and may need to repeat again afterwards.  -Continue Foley decompression for urinary retention likely related to stool ball/constipation.  Once constipation/stool ball resolved, can then attempt voiding trial.  -Ignore urine culture given absence of pyuria and therefore defer antibiotics.  -We will give another liter of IV fluids.  -Add on TSH.  -Consult PT/OT/ST.  -Appreciate neurology's assistance.  Perhaps consider potential further evaluation for parkinsonism?  -We will defer to case management, but may benefit from expedited referral to SNF given this was already in the works as an outpatient.    *Stage II sacral decubitus wound present on admission.  -Local wound care, wound consult.    *Hemorrhoids.  -Hydrocortisone suppository.    *Hypertension.  -Given elevated BUN and risk for acute kidney injury, will hold home ARB for the time being.    *Schizoaffective disorder.  In review of chart, was transitioning off paliperidone.  Has been started on low-dose clonazepam by PCP, not psychiatrist.  -For now, continue low-dose clonazepam.    *Degenerative joint disease.  -For now, continue home gabapentin.    *DVT prophylaxis: Enoxaparin    *Anticipated disposition:  Skilled nursing facility for rehabilitation    Code Status: Full Code    Admission Status:  Inpatient as expected to require a two-midnight stay for further work-up and treatment      Until 7p 12/19/20 while this patient is on the Med H New Admit service, please page the listed Attending Hospitalist with questions.  From 7p 12/19/20 until 7a 12/20/20, page the covering hospitalist (should be reachable at [phone]) with questions.  After 7a 12/20/20, page the listed primary team with questions.       History of Present Illness:   Requesting Physician: Lear Ng, Emergency Room    Chief Complaint: Weakness, possible stroke    History of Present Illness:   80 y.o. White female with a history of IBS, hypertension, degenerative joint disease, schizoaffective disorder presenting from [facility] assisted living facility for concern of stroke.  History is provided mostly by chart review, although the daughter is able to provide some collateral information.  Per chart review, she was last seen normal last night.  She was noted by staff to be weaker around 730 this morning.  There was concern for facial droop.  Per report provided to me by the ER, some of her facial droop may have been due to food caked to the left side  of her mouth that did not allow her to open her mouth fully.  She has had a head CT here without evidence for acute infarct or hemorrhage.  She has been ovale by neurology who did not feel her presentation was consistent with stroke.    Because of a finding of abdominal pain, she underwent a CT which showed a large stool ball as well as a distended bladder.  She has not received an enema, but a Foley catheter has been placed.    Ms. Luoma herself though alert and oriented is very very soft-spoken unable to provide much history, but her daughter is able to provide some collateral history.  Ms. Forker has been at the ALF since 06/2020.  Unfortunately her trajectory has been downwards where she has not been able to get out of bed by herself or walk in the last 1-1/2 months.  She eats pur??ed foods and drinks thin liquids.  However she often needs help drinking liquids which may be contributing to some of her constipation.  She does wear incontinence pads but does rely on 2 people at the ALF to get her out of bed to a toilet in order to urinate and for bowel movements.  Daughter thinks that due to some underlying anxiety, though Ms. Seevers has the  freedom to urinate or have bowel movements and her incontinence pads, she refuses to do so which she thinks may be leading to urinary retention and constipation.  Daughter reports also she has a history of IBS and has alternating constipation and diarrhea.  There have been no known issues with urinary retention before this ER visit.  Daughter also notes that the voice has become softer and softer though this has been an issue over many many months.  She does have PT, OT, ST.  They have been trying to get her to a skilled nursing facility.  She does not have a DNR or DNI order; at this time she does wish for full resuscitation but does not want to be in a vegetative state.    In the ED, received LR 1 L.  I discussed this case with the requesting provider.      Allergies:   Allergies   Allergen Reactions   ??? Carbamazepine Anaphylaxis, Hives and Other (See Comments)     Other Reaction: Other reaction  Pt has taken flexeril after carbamazapine w/ no reaction.   ??? Other Hives and Other (See Comments)     Uncoded Allergy. Allergen: Shellfish, Other Reaction: shrimp=gi upset   ??? Amitiza [Lubiprostone] Other (See Comments)     Dizziness     ??? Aspirin Other (See Comments)     Ringing in the ears    ??? Ciprofloxacin Other (See Comments)   ??? Hctz [Hydrochlorothiazide]    ??? Prednisone Other (See Comments)   ??? Raloxifene Other (See Comments)     Blurred vision and headaches   ??? Shellfish Containing Products      Other reaction(s): VOMITING   ??? Wellbutrin [Bupropion Hcl] Other (See Comments)     Patient feels tense and awful while taking   ??? Baclofen Nausea Only     Dizziness     ??? Bismuth Subsalicylate Tinnitus   ??? Escitalopram Rash       Medications:   Prior to Admission medications    Medication Dose, Route, Frequency   clonazePAM (KLONOPIN) 0.25 MG disintegrating tablet 0.25 mg, Oral, At bedtime   folic acid (FOLVITE) 800 MCG tablet 400 mcg,  Oral, Daily (standard)   acetaminophen (TYLENOL 8 HOUR) 650 MG CR tablet 650 mg, Oral, Every 8 hours PRN   amLODIPine (NORVASC) 5 MG tablet 5 mg, Oral, Daily (standard)   ASHWAGANDHA ROOT EXTRACT,BULK, MISC 2 tablets, Oral, 2 times a day, 800 mg per tablet.   carboxymethylcellulose sodium (THERATEARS) 0.25 % Drop 2 drops, Both Eyes, Every 2 hours PRN  Patient taking differently: Administer 2 drops to both eyes every two (2) hours as needed (dry eyes).   chlorhexidine (PERIDEX) 0.12 % solution 15 mL, Mouth, 2 times a day (standard), Rinse and spit out excess.   cholecalciferol, vitamin D3-50 mcg, 2,000 unit,, 50 mcg (2,000 unit) tablet 1 tablet, Oral, Daily (standard)   diclofenac sodium (VOLTAREN) 1 % gel 4 g, Topical, 4 times a day   diphenhydrAMINE (BENADRYL) 25 mg capsule 25 mg, Oral, Nightly, Patient reported medication to help her sleep   docusate sodium (COLACE) 100 MG capsule 100 mg, Oral, Daily (standard)  Patient taking differently: Take 200 mg by mouth daily.    erythromycin (ROMYCIN) 5 mg/gram (0.5 %) ophthalmic ointment Apply a thin layer to all incisions 3 times a day and inside the lower eyelid at bedtime.   gabapentin (NEURONTIN) 100 MG capsule PLEASE SEE ATTACHED FOR DETAILED DIRECTIONS   gabapentin (NEURONTIN) 300 MG capsule Take 1-2 pills PO qhs   glycerin, adult, Supp 1 suppository, Rectal, Daily PRN   ketoconazole (NIZORAL) 2 % shampoo No dose, route, or frequency recorded.   lidocaine HCL (ASPERCREME, LIDOCAINE HCL,) 4 % lqro 1 application, Topical (Top), 3 times a day PRN   melatonin 3 mg Tab 3 mg, Oral, Every evening   olmesartan (BENICAR) 20 MG tablet 20 mg, Oral, Daily (standard)   paliperidone palmitate (INVEGA SUSTENNA) 156 mg/mL Syrg 156 mg, Intramuscular, Every 28 days   sertraline (ZOLOFT) 100 MG tablet 100 mg, Oral, Daily (standard)       Medical History:  Past Medical History:   Diagnosis Date   ??? ABMD (anterior basement membrane dystrophy)    ??? Allergic rhinitis    ??? Anxiety    ??? Arthritis    ??? Cataract     Nuclear sclerotic cataract OS, BCVA 20/20    ??? Depression    ??? Dry eyes    ??? Fibromyalgia    ??? Food intolerance    ??? GERD (gastroesophageal reflux disease)    ??? History of migraine     With Aura   ??? IBS (irritable bowel syndrome)    ??? 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 Osteoarthritis    ??? Ptosis     Intermittent ptosis OD   ??? PVD (posterior vitreous detachment), bilateral    ??? Schizoaffective disorder (CMS-HCC)    ??? Sjogren's syndrome (CMS-HCC)    ??? Small intestinal bacterial overgrowth (SIBO)        Surgical History:   Past Surgical History:   Procedure Laterality Date   ??? blepharectomy Bilateral 10/11/2020   ??? CATARACT EXTRACTION Right 02/08/2013   ??? HERNIA REPAIR      x4   ??? HYSTERECTOMY      PARTIAL   ??? OCULOPLASTIC SURGERY Bilateral 10/11/2020    BLEPHAROPLASTY UPPER; W/EXCESS SKIN WT DOWN LID;    ??? PR FIX ECTROPION,ENTENSV LID REPAIR Bilateral 10/11/2020    Procedure: REPAIR OF ECTROPION; EXTENSIVE (EG, TARSAL STRIP OPERATIONS);  Surgeon: Claris Pong, MD;  Location: [facility];  Service: Ophthalmology   ???  PR REV UPPER EYELID W EXCESS SKIN Bilateral 10/11/2020    Procedure: BLEPHAROPLASTY UPPER; W/EXCESS SKIN WT DOWN LID;  Surgeon: Claris Pong, MD;  Location: [facility] OR [facility];  Service: Ophthalmology   ??? SIGMOIDECTOMY     ??? SKIN BIOPSY     ??? TONSILLECTOMY AND ADENOIDECTOMY         Social History:   Social History     Tobacco Use   ??? Smoking status: Former Smoker     Years: 30.00     Quit date: 08/19/1993     Years since quitting: 27.3   ??? Smokeless tobacco: Never Used   Substance Use Topics   ??? Alcohol use: Yes     Alcohol/week: 1.0 standard drink     Types: 1 Glasses of wine per week     Comment: <1 / month        Family History:   Family History   Problem Relation Age of Onset   ??? Cancer Mother         OVARIAN   ??? Depression Mother    ??? Ovarian cancer Mother    ??? Thyroid disease Father    ??? Hypertension Father    ??? Depression Father    ??? Arthritis Father    ??? Heart disease Father    ??? Thyroid disease Sister    ??? Hypertension Sister    ??? Hypertension Maternal Grandmother    ??? Stroke Maternal Grandmother    ??? Hyperlipidemia Daughter    ??? No Known Problems Maternal Grandfather    ??? No Known Problems Paternal Grandmother    ??? No Known Problems Paternal Grandfather    ??? No Known Problems Sister    ??? No Known Problems Brother    ??? No Known Problems Maternal Aunt    ??? No Known Problems Maternal Uncle    ??? No Known Problems Paternal Aunt    ??? No Known Problems Paternal Uncle    ??? No Known Problems Other    ??? Alcohol abuse Neg Hx    ??? COPD Neg Hx    ??? Diabetes Neg Hx    ??? Drug abuse Neg Hx    ??? Hearing loss Neg Hx    ??? Vision loss Neg Hx    ??? Learning disabilities Neg Hx    ??? Melanoma Neg Hx    ??? Basal cell carcinoma Neg Hx    ??? Squamous cell carcinoma Neg Hx    ??? Amblyopia Neg Hx    ??? Blindness Neg Hx    ??? Cataracts Neg Hx    ??? Glaucoma Neg Hx    ??? Macular degeneration Neg Hx    ??? Retinal detachment Neg Hx    ??? Strabismus Neg Hx        Review of Systems:  General: Negative for chills, fever  Psychological: Negative for disorientation or hallucinations  Ophthalmic: Negative for loss of vision or new blurry vision  ENT: Negative for nasal congestion or sore throat  Allergy/Immunology: Negative for itchy/watery eyes  Hematologic/Lymphatic: Negative for bleeding, bruising, or lymph node swelling  Endocrine: Negative for polydipsia/polyuria  Respiratory: Negative for cough or shortness of breath  Cardiovascular: Negative for chest pain or palpitations  Gastrointestinal: Negative for abdominal pain  Genito-Urinary: Negative for dysuria or urinary frequency/urgency  Musculoskeletal: Negative for joint pain or muscle pain  Neurological: Negative for gait disturbance or numbness/tingling  Dermatological: Negative for rash      Objective:  Physical Exam  Vitals: Vitals:    12/19/20 1700   BP: 122/67   Pulse: 67   Resp: 13   Temp:    SpO2: 95%      General: Lying comfortably in bed, no acute distress, appears tired.   Eyes: No conjunctival injection or icterus.  Pupils equal.   ENT: Mucous membranes dry.  Normal appearing ears, nose.   Neck: Midline trachea.  No obvious goiter.   Respiratory: Normal respiratory effort.  Clear to auscultation bilaterally without wheezes or crackles.    Cardiovascular: Regular rhythm without murmur.  Radial, DP pulses 2+ bilaterally.  No lower extremity edema.   Gastrointestinal: Bowel sounds present, soft, nontender, nondistended.  No hepatomegaly.   Genitourinary: Foley in place.   Skin: Stage 2 decubitus wound about 2 cm in diameter just superior and to right of midline in sacral area.  No breakdown of heels.  Right AFO in place.  Warm, dry.   Musculoskeletal: Diffusely weak without focal weakness.  Cogwheeling.   Psychiatric: Alert, oriented.  Answers questions appropriately.  Judgment and insight intact.   Neurologic: Extraocular movements intact, no facial asymmetry.  No gross sensory deficits.       ECG: As read by me, shows normal sinus rhythm, normal axis, normal PR interval, normal QRS width, normal-appearing QT interval and no ST abnormalities.      Imaging: I reviewed the images myself.    CT Head Wo Contrast    Result Date: 12/19/2020  EXAM: Computed tomography, head or brain without contrast material. DATE: 12/19/2020 ACCESSION: [id number] UN DICTATED: 12/19/2020 12:49 PM INTERPRETATION LOCATION: [facility] CLINICAL INDICATION: 80 years old Female with stroke  COMPARISON: CT head 10/26/2020 TECHNIQUE: Axial CT images of the head  from skull base to vertex without contrast. FINDINGS: There are scattered and confluent hypodense foci within the periventricular and deep white matter.  These are nonspecific but commonly associated with small vessel ischemic changes. Similar-appearing hypodensities in the left cerebellar hemisphere, compatible with remote infarcts. There is no midline shift. No mass lesion. There is no evidence of acute infarct. No acute intracranial hemorrhage. No fractures are evident. The sinuses are pneumatized.     No acute intracranial abnormalities.    CT Abdomen Pelvis with IV Contrast ONLY    Result Date: 12/19/2020  EXAM: CT ABDOMEN PELVIS W CONTRAST DATE: ACCESSION: [id number] UN DICTATED: 12/19/2020 3:02 PM INTERPRETATION LOCATION: [facility] CLINICAL INDICATION: abdominal pain, hernia, r/o obstruction  COMPARISON: None TECHNIQUE: Contiguous axial CT images of the abdomen and pelvis were performed following intravenous administration of contrast from the lung bases to the pubic symphysis. Sagittal and coronal reconstructions were provided for review. FINDINGS: LOWER THORAX: No pleural or pericardial effusions. Bibasilar atelectasis. LIVER: The liver is normal in size and contour. Multiple subcentimeter hepatic low-attenuation lesions, too small to characterize on CT. No intrahepatic biliary dilation. Hepatic veins and main portal vein appear patent. GALLBLADDER/BILIARY: The gallbladder is normal in appearance.  No wall thickening or pericholecystic fluid. Common bile duct is not dilated.  SPLEEN: No splenomegaly. PANCREAS: No focal masses or ductal dilation. ADRENALS: Normal size without suspicious nodules. KIDNEYS: Normal size with symmetric enhancement. Mild bilateral hydronephrosis. Unchanged right interpolar cyst. GI TRACT: Right paramedian ventral hernia containing a decompressed air and fluid-filled loop of bowel (2:90). Multiple dilated loops of air and fluid-filled small bowel, concerning for mechanical obstruction. Large rectal stool ball measuring up to 10.7 cm with associated rectal wall thickening. PERITONEUM AND MESENTERY: No free fluid, pneumoperitoneum or drainable collections.  No pathologic adenopathy. RETROPERITONEUM: No pathologic adenopathy.  No masses or fluid collection. VESSELS: The aorta is normal in caliber. There is moderate atherosclerotic calcification of the aorta and branch vessels. PELVIS/BLADDER: Markedly distended urinary bladder which compresses on the right paramedian ventral hernia, as above. No pathologic pelvic or inguinal adenopathy. REPRODUCTIVE: The uterus is surgically absent. BONES AND SOFT TISSUES: There are degenerative changes of the spine. No suspicious soft tissue lesions.     Limited evaluation secondary to minimal intra-abdominal fat and markedly distended bladder and rectum. 1.Large rectal stool ball measuring approximately 10.7 cm in diameter with associated rectal wall thickening, which could represent stercoral colitis. 2.Markedly distended urinary bladder, likely secondary to outlet obstruction in the setting of large rectal stool ball as above. There is mild bilateral associated hydronephrosis. 3.Multiple dilated air and fluid-filled loops of small bowel, concerning for mechanical small bowel obstruction secondary to a loop of bowel in the ventral body wall hernia, possibly exacerbated by markedly distended bladder and rectum. Additional chronic and incidental findings, as above. The findings of this study were discussed via telephone with DR. ALEXANDRA DIGENAKIS by Dr. Thom Chimes on 2021-01-03 3:27 PM.         Laboratory Data: I reviewed the studies myself.  Recent Labs     2021/01/03  1227   WBC 8.3   HGB 10.8*   HCT 31.6*   PLT 214   MCV 94.3     Recent Labs 03-Jan-2021  1227   NA 137   K 4.2   CL 105   CO2 25.0   BUN 36*   CREATININE 0.70   GLU 128   CALCIUM 9.7   PROT 6.5   BILITOT 0.8   AST 75*   ALT 49   ALKPHOS 68     Recent Labs     01-03-21  1227   TROPONINI 18   INR 1.16   APTT 27.0     Recent Labs     03-Jan-2021  1435   WBCUA <1   NITRITE Positive*   LEUKOCYTESUR Negative   BACTERIA Few*   RBCUA <1   BLOODU Small*   GLUCOSEU Negative   PROTEINUA Trace*   KETONESU Trace*     Microbiology Results (last day)     Procedure Component Value Date/Time Date/Time    COVID-19 PCR [[id number]] Collected: 01-03-2021 1555    Lab Status: In process Specimen: Nasopharyngeal Swab Updated: 2021-01-03 1618    Urine Culture [[id number]] Collected: 01-03-2021 1435    Lab Status: In process Specimen: Urine from Catheterized-In and Out Catheter Updated: 2021/01/03 1521          Pending Labs     Order Current Status    COVID-19 PCR In process    TSH In process    Urine Culture In process

## 2020-12-20 NOTE — Unmapped (Signed)
Speech Language Pathology Clinical Swallow Assessment  Evaluation (12/20/20 0900)    Patient Name:  Cindy [name]       Medical Record Number: [id number]   Date of Birth: 04/28/1941  Sex: Female            SLP Treatment Diagnosis: R/o dysphagia  Activity Tolerance: Patient tolerated treatment well    Assessment  Pt. was asleep, though arousable; on 2L o2 via [place]; in NAD when visited today at the bedside for a clinical swallowing evaluation. Oral mechanism notable for generalized weakness/debility and sparse natural dentition/poor oral care. Vocal quality clear without apparent dysphonia, though, with severely decreased vocal intensity 2/2 inadequate breath support, also likely related to overall strength and endurance deficits. For PO trials this date, Pt. required full assistance for feeding. Labial seal was reduced for cup sips of thin liquids with moderate anterior oral loss, though, grossly adequate for intra-oral pressure generation for straw-drinking; no oral loss for single and consecutive straw sips. Bite/mastication and bolus formation/manipulation/anterior-posterior bolus transit were slow/prolonged, requiring moderately increased oral preparatory time. Mild, diffuse oral residuals remained within the oral cavity following swallows; not of clinical significance; cleared with cued liquid wash. No overt s/s aspiration evident for any trialed texture. Per ordering provider, CXR did show an opacity with concern for aspiration/infection/atelectasis, however, Pt. is on room air and not displaying any abnormal respiratory symptoms/is without leukocytosis; low suspicion for aspiration pneumonia at this point. Recommend L6/L0 diet with 1:1 feeding assistance, when awake/alert and with use of aspiration + reflux precautions/oral infection prevention protocol; medications to be provided crushed in purees as needed. Pt.'s diet may be further downgraded to pureed solids to reflect baseline as needed for ease of intake/efficiency. ST to sign off at this time. Medical team to re-consult should concern for silent aspiration later develop necessitating further evaluation of oropharyngeal swallowing function via instrumental means.  Risk for Aspiration: Mild     Recommendations:       Diet Liquids Recommendations: Thin Liquids, Level 0, No Restrictions    Diet Solids Recommendation: Soft & Bite-sized, Level 6    Recommended Form of Medications: Crushed, With puree (As tolerated/Per RN discretion)      Compensatory Swallowing Strategies: Upright as possible for all oral intake, Remain upright for 20-30 minutes after meals, One to one assist with meals, Alternate solids and liquids, Small bites/sips, Eat/feed slowly    Post Acute Discharge Recommendations  Post Acute SLP Discharge Recommendations: SLP services not indicated    Prognosis: Fair  Positive Indicators: Cooperation; Motivation  Barriers to Discharge: Cognitive deficits, Functional strength deficits, Endurance deficits, Inability to safely perform ADLS, Pain     Plan of Care  SLP Follow-up / Frequency: D/C Services, D/C Services      Treatment Goals:  Patient and Family Goal: To eat/drink by mouth    Subjective  Current Functional Status: 80 y.o. White female with a history of IBS, hypertension, degenerative joint disease, schizoaffective disorder presenting from [facility] assisted living facility for concern of stroke. ST consult for voice and modified barium swallowing study received with Pt. visited at bedside to determine most appropriate plan of care.      Communication Preference: Verbal  Patient/Caregiver Reports: Per H&P, Pt. eats a pureed/thin liquid diet at baseline within an ALF setting, with limited intake of liquids given feeding assistance requirements. Pt. also has demonstrated decreasing vocal intensity over the past several months.     Allergies: Carbamazepine, Other, Amitiza [lubiprostone], Aspirin, Ciprofloxacin, Hctz [hydrochlorothiazide],  Prednisone, Raloxifene, Shellfish containing products, Wellbutrin [bupropion hcl], Baclofen, Bismuth subsalicylate, and Escitalopram  Current Facility-Administered Medications   Medication Dose Route Frequency Provider Last Rate Last Admin   ??? acetaminophen (TYLENOL) tablet 500 mg  500 mg Oral TID Anell Barr, MD       ??? acetaminophen (TYLENOL) tablet 650 mg  650 mg Oral Q4H PRN Anell Barr, MD       ??? aluminum-magnesium hydroxide-simethicone (MAALOX MAX) 80-80-8 mg/mL oral suspension  30 mL Oral Q4H PRN Anell Barr, MD       ??? amLODIPine (NORVASC) tablet 5 mg  5 mg Oral Daily Anell Barr, MD       ??? [START ON 12/21/2020] cefTRIAXone (ROCEPHIN) 1 g in sodium chloride 0.9 % (NS) 100 mL IVPB-connector bag  1 g Intravenous Q24H SCH Jeanene Erb, FNP       ??? cholecalciferol (vitamin D3 25 mcg (1,000 units)) tablet 50 mcg  50 mcg Oral Daily Anell Barr, MD       ??? clonazePAM (KlonoPIN) disintegrating tablet 0.25 mg  0.25 mg Oral At bedtime Anell Barr, MD   0.25 mg at 12/19/20 2110   ??? diclofenac sodium (VOLTAREN) 1 % gel 4 g  4 g Topical QID Anell Barr, MD   4 g at 12/20/20 1228   ??? enoxaparin (LOVENOX) syringe 30 mg  30 mg Subcutaneous Q24H [facility] Anell Barr, MD   30 mg at 12/20/20 0950   ??? folic acid (FOLVITE) tablet 500 mcg  500 mcg Oral Daily Anell Barr, MD       ??? gabapentin (NEURONTIN) capsule 100 mg  100 mg Oral Daily Anell Barr, MD       ??? gabapentin (NEURONTIN) capsule 300 mg  300 mg Oral Nightly Anell Barr, MD       ??? guaiFENesin (ROBITUSSIN) oral syrup  200 mg Oral Q4H PRN Anell Barr, MD       ??? hydrocortisone (ANUSOL-HC) suppository 25 mg  25 mg Rectal BID Anell Barr, MD       ??? melatonin tablet 3 mg  3 mg Oral QPM Anell Barr, MD       ??? ondansetron (ZOFRAN-ODT) disintegrating tablet 4 mg  4 mg Oral Q6H PRN Anell Barr, MD        Or   ??? ondansetron [facility]) injection 4 mg  4 mg Intravenous Q6H PRN Anell Barr, MD       ??? sertraline (ZOLOFT) tablet 100 mg  100 mg Oral Daily Anell Barr, MD       ??? sodium chloride (NS) 0.9 % infusion  75 mL/hr Intravenous Continuous Jeanene Erb, FNP         Past Medical History:   Diagnosis Date   ??? ABMD (anterior basement membrane dystrophy)    ??? Allergic rhinitis    ??? Anxiety    ??? Arthritis    ??? Cataract     Nuclear sclerotic cataract OS, BCVA 20/20    ??? Depression    ??? Dry eyes    ??? Fibromyalgia    ??? Food intolerance    ??? GERD (gastroesophageal reflux disease)    ??? History of migraine     With Aura   ??? IBS (irritable bowel syndrome)    ??? 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 Osteoarthritis    ??? Ptosis     Intermittent  ptosis OD   ??? PVD (posterior vitreous detachment), bilateral    ??? Schizoaffective disorder (CMS-HCC)    ??? Sjogren's syndrome (CMS-HCC)    ??? Small intestinal bacterial overgrowth (SIBO)      Family History   Problem Relation Age of Onset   ??? Cancer Mother         OVARIAN   ??? Depression Mother    ??? Ovarian cancer Mother    ??? Thyroid disease Father    ??? Hypertension Father    ??? Depression Father    ??? Arthritis Father    ??? Heart disease Father    ??? Thyroid disease Sister    ??? Hypertension Sister    ??? Hypertension Maternal Grandmother    ??? Stroke Maternal Grandmother    ??? Hyperlipidemia Daughter    ??? No Known Problems Maternal Grandfather    ??? No Known Problems Paternal Grandmother    ??? No Known Problems Paternal Grandfather    ??? No Known Problems Sister    ??? No Known Problems Brother    ??? No Known Problems Maternal Aunt    ??? No Known Problems Maternal Uncle    ??? No Known Problems Paternal Aunt    ??? No Known Problems Paternal Uncle    ??? No Known Problems Other    ??? Alcohol abuse Neg Hx    ??? COPD Neg Hx    ??? Diabetes Neg Hx    ??? Drug abuse Neg Hx    ??? Hearing loss Neg Hx    ??? Vision loss Neg Hx    ??? Learning disabilities Neg Hx    ??? Melanoma Neg Hx    ??? Basal cell carcinoma Neg Hx    ??? Squamous cell carcinoma Neg Hx    ??? Amblyopia Neg Hx    ??? Blindness Neg Hx    ??? Cataracts Neg Hx    ??? Glaucoma Neg Hx    ??? Macular degeneration Neg Hx    ??? Retinal detachment Neg Hx    ??? Strabismus Neg Hx      Past Surgical History:   Procedure Laterality Date   ??? blepharectomy Bilateral 10/11/2020   ??? CATARACT EXTRACTION Right 02/08/2013   ??? HERNIA REPAIR      x4   ??? HYSTERECTOMY      PARTIAL   ??? OCULOPLASTIC SURGERY Bilateral 10/11/2020    BLEPHAROPLASTY UPPER; W/EXCESS SKIN WT DOWN LID;    ??? PR FIX ECTROPION,ENTENSV LID REPAIR Bilateral 10/11/2020    Procedure: REPAIR OF ECTROPION; EXTENSIVE (EG, TARSAL STRIP OPERATIONS);  Surgeon: Claris Pong, MD;  [facility];  Service: Ophthalmology   ??? PR REV UPPER EYELID W EXCESS SKIN Bilateral 10/11/2020    Procedure: BLEPHAROPLASTY UPPER; W/EXCESS SKIN WT DOWN LID;  Surgeon: Claris Pong, MD;  [facility];  Service: Ophthalmology   ??? SIGMOIDECTOMY     ??? SKIN BIOPSY     ??? TONSILLECTOMY AND ADENOIDECTOMY       Social History     Tobacco Use   ??? Smoking status: Former Smoker     Years: 30.00     Quit date: 08/19/1993     Years since quitting: 27.3   ??? Smokeless tobacco: Never Used   Substance Use Topics   ??? Alcohol use: Yes     Alcohol/week: 1.0 standard drink     Types: 1 Glasses of wine per week     Comment: <1 /  month      General:  Hearing Exceptions: None                 Vision: Functional for self-feeding                   Medical Tests / Procedures Comments: HCT 12/19/20: No acute intracranial abnormalities.  Equipment/Environment: Supplemental oxygen, Patient not wearing mask for full session, Other (SLP wearing mask for full session per hospital protocol)  Services patient receives: OT, PT, SLP    Objective  Temperature Spikes Noted: No  Respiratory Status : O2 via nasal cannula (2L)  History of Intubation: No    Behavior/Cognition: Cooperative, Lethargic  Positioning : Upright in bed    Oral / Motor Exam  Vocal Quality: Weak      Labial ROM: Within Functional Limits   Labial Symmetry: Within Functional Limits  Labial Strength: Reduced   Lingual ROM: Within Functional Limits  Lingual Symmetry: Within Functional Limits  Lingual Strength: Reduced      Velum: Within Functional Limits   Mandible: Within Functional Limits  Coordination: WFLs  Facial ROM: Within Functional Limits   Facial Symmetry: Within Functional Limits  Facial Strength: Reduced      Vocal Intensity: Severely decreased       Apraxia: None present   Dysarthria: None present       Breath Support: Inadequate for speech   Dentition: Some missing teeth, Poor dental/oral hygiene    Consistencies assessed: Ice chips, thin liquids via cup and straw sips, pureed solids via 1/2 and full tsp. trials, regular solids via small bites    Medical Staff Made Aware: RN, NP    Speech Therapy Session Duration  SLP Individual [mins]: 20    I attest that I have reviewed the above information.  Signed: Valeta Harms, SLP    Filed 12/20/2020

## 2020-12-20 NOTE — Unmapped (Signed)
PHYSICAL THERAPY  Evaluation (12/20/20 0930)      Patient Name:?? Cindy [name]????????   Medical Record Number: [id number]   Date of Birth: Feb 21, 1941  Sex: Female??????????????      Treatment Diagnosis: impaired functional mobility     Activity Tolerance: Limited by Mental Status, Limited secondary to medical complications (constant bowel incontinence)     ASSESSMENT  Problem List: Decreased strength, Decreased endurance, Decreased mobility, Decreased cognition, Impaired ADLs, Pain, Fall Risk, Core weakness, Postural Weakness      Assessment : 80 y.o. White female with a history of IBS, hypertension, degenerative joint disease, schizoaffective disorder presenting from [facility] assisted living facility for concern of stroke.  History is provided mostly by chart review, although the daughter is able to provide some collateral information.  Per chart review, she was last seen normal last night.  She was noted by staff to be weaker around 730 this morning.  There was concern for facial droop.  Per report provided to me by the ER, some of her facial droop may have been due to food caked to the left side of her mouth that did not allow her to open her mouth fully.  She has had a head CT here without evidence for acute infarct or hemorrhage.  She has been ovale by neurology who did not feel her presentation was consistent with stroke.     Because of a finding of abdominal pain, she underwent a CT which showed a large stool ball as well as a distended bladder.  She has not received an enema, but a Foley catheter has been placed.    Pt presented to PT eval with the above listed impairments. Today was minimally arousable to participate but was oriented when assessed. She is currently maxA for basic bed mobility tasks. Today's assessment was limited by constant bowel incontinence. She will benefit from additional skilled acute PT services to progress mobility as tolerated. At this time, recommending 5x/weekly (low intensity) post-acute PT.     After a review of the personal factors, comorbidities, clinical presentation, and examination of the number of affected body systems, the patient presents as a moderate complexity case.      Today's Interventions: PT eval, extensive time spent on rolling R and L for pericare 2/2 constant bowel incontinence, repositioning for skin integrity and comfort, bed in chair, multimodal cueing to stimulate arousal. Pt education as able, no family currently present to provide education on role of PT and POC.     PLAN  Planned Frequency of Treatment:?? 1-2x per day for: 2-3x week       Planned Interventions: Balance activities, Diaphragmatic / Pursed-lip breathing, Education - Patient, Education - Family / caregiver, Endurance activities, Functional mobility, Gait training, Home exercise program, Passive range of motion, Positioning, Self-care / Home training, Therapeutic exercise, Therapeutic activity, Transfer training     Post-Discharge Physical Therapy Recommendations:?? 5x weekly, Low intensity     PT DME Recommendations: Defer to post acute??????????       Goals:   Patient and Family Goals: no family present     Long Term Goal #1: TBD pending further mobility assessment.        SHORT GOAL #1: Pt will perform bed mobility with modA.  ?????????????????????? Time Frame : 2 weeks  SHORT GOAL #2: Pt will participate in an EOB/OOB mobility assessment.  ?????????????????????? Time Frame : 1 week      Prognosis:?? Fair  Positive Indicators: care staff  Barriers to Discharge: Cognitive  deficits, Functional strength deficits, Endurance deficits, Inability to safely perform ADLS, Pain     SUBJECTIVE     Patient reports: RN cleared pt for PT. Pt somnolent, eyes closed most of session. Intermittently verbalizing but very soft spoken.  Current Functional Status: Pt received supine and left in partial bed in chair position with all needs met, bed alarm activated, and RN aware.     Prior Functional Status: Pt unable to provide PLOF, no family at bedside. Per chart review, Unfortunately her trajectory has been downwards where she has not been able to get out of bed by herself or walk in the last 1-1/2 months.  She eats pur??ed foods and drinks thin liquids.  However she often needs help drinking liquids which may be contributing to some of her constipation.  She does wear incontinence pads but does rely on 2 people at the ALF to get her out of bed to a toilet in order to urinate and for bowel movements.         Past Medical History:   Diagnosis Date   ??? ABMD (anterior basement membrane dystrophy)    ??? Allergic rhinitis    ??? Anxiety    ??? Arthritis    ??? Cataract     Nuclear sclerotic cataract OS, BCVA 20/20    ??? Depression    ??? Dry eyes    ??? Fibromyalgia    ??? Food intolerance    ??? GERD (gastroesophageal reflux disease)    ??? History of migraine     With Aura   ??? IBS (irritable bowel syndrome)    ??? 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 Osteoarthritis    ??? Ptosis     Intermittent ptosis OD   ??? PVD (posterior vitreous detachment), bilateral    ??? Schizoaffective disorder (CMS-HCC)    ??? Sjogren's syndrome (CMS-HCC)    ??? Small intestinal bacterial overgrowth (SIBO)          @  Past Surgical History:   Procedure Laterality Date   ??? blepharectomy Bilateral 10/11/2020   ??? CATARACT EXTRACTION Right 02/08/2013   ??? HERNIA REPAIR      x4   ??? HYSTERECTOMY      PARTIAL   ??? OCULOPLASTIC SURGERY Bilateral 10/11/2020    BLEPHAROPLASTY UPPER; W/EXCESS SKIN WT DOWN LID;    ??? PR FIX ECTROPION,ENTENSV LID REPAIR Bilateral 10/11/2020    Procedure: REPAIR OF ECTROPION; EXTENSIVE (EG, TARSAL STRIP OPERATIONS);  Surgeon: Claris Pong, MD;  [facility];  Service: Ophthalmology   ??? PR REV UPPER EYELID W EXCESS SKIN Bilateral 10/11/2020    Procedure: BLEPHAROPLASTY UPPER; W/EXCESS SKIN WT DOWN LID;  Surgeon: Claris Pong, MD;  [facility];  Service: Ophthalmology   ??? SIGMOIDECTOMY     ??? SKIN BIOPSY     ??? TONSILLECTOMY AND ADENOIDECTOMY            @     Allergies: Carbamazepine, Other, Amitiza [lubiprostone], Aspirin, Ciprofloxacin, Hctz [hydrochlorothiazide], Prednisone, Raloxifene, Shellfish containing products, Wellbutrin [bupropion hcl], Baclofen, Bismuth subsalicylate, and Escitalopram       Objective Findings  Precautions / Restrictions  Precautions: Falls precautions  Weight Bearing Status: Non-applicable  Required Braces or Orthoses: Non-applicable     Communication Preference: Verbal          Pain Comments: pt reports abdominal discomfort, does not rate. RN aware.  Medical Tests / Procedures: Reviewed in Epic  Equipment / Environment: Foley, Vascular access (PIV, TLC, Port-a-cath, PICC), Supplemental oxygen, Patient not wearing mask for full session     At Rest: VSS per Epic  With Activity: NAD           Living Situation  Living Environment: Facility (ALF)  Lives With: Care Staff      Cognition  Cognition: Impaired at baseline  Cognition comment: Somnolent but oriented x 3, minimal awakening despite multimodal stimulation        Skin Inspection: Intact where visualized     UE ROM / Strength  UE ROM/Strength: Left Impaired/Limited, Right Impaired/Limited  RUE Impairment: Reduced strength  LUE Impairment: Reduced strength  LE ROM / Strength  LE ROM/Strength: Left Impaired/Limited, Right Impaired/Limited  RLE Impairment: Reduced strength  LLE Impairment: Reduced strength     Motor/ Sensory/ Neuro  Sensation comment: unable to formally assess      Bed Mobility: rolling R and L with maxA, cues to reach across midline to for rails to assist. Boosting up in bed with maxA x 2     Endurance: poor     Physical Therapy Session Duration  PT Individual [mins]: 35  Reason for Co-treatment: Poor activity tolerance, To safely progress mobility     Medical Staff Made Aware: RN aware     I attest that I have reviewed the above information.  SignedHenrietta Dine, PT  Filed 12/20/2020

## 2020-12-20 NOTE — Unmapped (Addendum)
WOCN Consult Services                                                 Wound Evaluation: Pressure Injury    Reason for Consult:   - Initial  - Pressure Injury    Problem List:   Principal Problem:    Weakness  Active Problems:    Abnormality of gait    Hypertension, benign    Irritable bowel syndrome with both constipation and diarrhea    Assessment: Per EMR- 80 y.o. White female with a history of IBS, hypertension, degenerative joint disease, schizoaffective disorder presenting from [facility] assisted living facility for concern of stroke.  History is provided mostly by chart review, although the daughter is able to provide some collateral information.  Per chart review, she was last seen normal last night.  She was noted by staff to be weaker around 730 this morning.  There was concern for facial droop.  Per report provided to me by the ER, some of her facial droop may have been due to food caked to the left side of her mouth that did not allow her to open her mouth fully.  She has had a head CT here without evidence for acute infarct or hemorrhage.  She has been evaluated by neurology who did not feel her presentation was consistent with stroke.  Because of a finding of abdominal pain, she underwent a CT which showed a large stool ball as well as a distended bladder.  She has not received an enema, but a Foley catheter has been placed.    We were asked to evaluate this patient for documented stage 2 pressure injury to her coccyx POA from a facility.     Patient is very somnolent at this time. Not opening her eyes when I addressed her.     She had a large liquid BM on the pad and a soiled foam dressing over her coccyx area.    Per night shift RN patient had several such BMs overnight.     Patient has two area of pressure over her coccyx. These are DTI that are purple and maroon colored. Epidermal lifting has started.     At this time until stools are normalized and patient is not incontinent of stool I would recommend crusting and zinc oxide over the perineal area and the pressure injuries.     Head to toe assessment completed. No other issues noted. Patient did have a orthotic on the right foot for foot drop. I removed and inspected the skin. Recommend that she not wear in bed related to potential to cause pressure injury.        12/20/20 0800   Wound 12/19/20 Coccyx   Date First Assessed/Time First Assessed: 12/19/20 2000   Present on Hospital Admission: Yes  Location: Coccyx   Wound Image    Dressing Status      Soiled;Removed   Wound Length (cm) 8 cm  (two open areas measured together)   Wound Width (cm) 7 cm  (two open areas measured together)   Wound Depth (cm)   (indeterminable)   Wound Surface Area (cm^2) 56 cm^2   Wound Bed Purple/maroon discoloration;Non-blanchable erythema;Pink   Odor None   Peri-wound Assessment      Clean;Dry;Intact;Blanchable erythema;Non-blanchable erythema   Exudate Amnt      None  Treatments Cleansed/Irrigation;Zinc based products   Dressing   (Crusting and zinc oxide)     Continence Status:   Incontinence of bladder: Foley in place  Incontinent of bowel: Incontinence care required.    Moisture Associated Skin Damage:   - Not an issue at this time. Patient at high risk r/t fecal incontinence     Lab Results   Component Value Date    WBC 8.5 12/20/2020    HGB 10.3 (L) 12/20/2020    HCT 30.1 (L) 12/20/2020    ESR 11 01/30/2020    CRP 1.8 09/01/2014    A1C 5.4 02/06/2020    GLUF 98 12/07/2015    GLU 128 12/19/2020    POCGLU 120 12/20/2020    ALBUMIN 3.4 12/19/2020    PROT 6.5 12/19/2020     Risk Factors:   - Aging  - Cognitive Impairment  - Friction/shear  - Immobility  - Moisture  - Multiple co-morbidities    Braden Scale Score: 13     Support Surface:   - Alternating Pressure    Type Debridement Completed By WOCN:  N/A    Teaching:  - No teach. Patient somolent    WOCN Recommendations:   - See nursing orders for wound care instructions.  - Contact WOCN with questions, concerns, or wound deterioration.    Topical Therapy/Interventions:   - Crusting (stoma powder or antifungal powder)  - Zinc oxide barrier cream     Recommended Consults:  - Not Applicable    WOCN Follow Up:  - Weekly    Plan of Care Discussed With:   - RN Joyce Gross    Supplies Ordered: No- [employer] on the unit    Workup Time:   45 minutes     Jeanelle Malling RN BS [facility]OCN  (Pager)- [phone]  (Office)- [phone]

## 2020-12-20 NOTE — Unmapped (Signed)
OCCUPATIONAL THERAPY  Evaluation (12/20/20 0932)    Patient Name:  Cindy [name]       Medical Record Number: [id number]   Date of Birth: [birth year]  Sex: Female          OT Treatment Diagnosis:  global weakness, decreased activity tolerance/cognition/balance/ROM, impacting ADL performance/safety    Assessment  Problem List: Fall Risk, Impaired ADLs, Impaired balance, Cognitive/Behavioral impairments, Postural Weakness, Poor emotional regulation, Impaired judgement, Decreased endurance, Decreased range of motion, Decreased mobility, Decreased cognition, Decreased strength    Assessment: Cindy [name] is a 80 y.o. female who presented to [name] with Weakness. At baseline, patient was assist in ADLs,  IADLs, and  functional mobility. Patient is now limited by problem list as mentioned above, impacting ADL/functional mobility performance and safety. OT eval limited pt pt weakness/fatigue/bowel incontinence; max. A for bed mobility, max-total A for ADls. Patient would benefit from skilled OT services 2-3 times per week while at [name], recommend continued skilled OT services 5 times per week at a low intensity upon discharge. After review of the patient's occupational profile and history, assessment of occupational performance, clinical decision making, and development of POC, the patient presents as a moderate complexity case.    Today's Interventions: OT evaluation, bed mobility, toileting, toilet transfer, lower body dressing, upper body dressing, grooming tasks, cognition and bathing. Educated on role of OT, POC, safety    Activity Tolerance During Today's Session  Limited by fatigue    Plan  Planned Frequency of Treatment:  1-2x per day for: 3-4x week       Planned Interventions:  Adaptive equipment, ADL retraining, Balance activities, Bed mobility, Compensatory tech. training, Conservation, Home exercise program, Education - Patient, Functional mobility, Functional cognition, Environmental support, Endurance activities, Education - Family / caregiver, Passive range of motion, Range of motion, Therapeutic exercise, Transfer training, UE Strength / coordination exercise, Therapist provided opportunity for spontaneous movement, Safety education, Wheelchair evaluation / modification, TEFL teacher Occupational Therapy Recommendations:   5x weekly, Low intensity   OT DME Recommendations: Defer to post acute -        GOALS:   Patient and Family Goals: did not state    [facility] Term Goal #1: Pt will score 14+/24 on AMPAC in 6 weeks       Short Term:  Pt will complete EOB/OOB assessment   Time Frame : 1 week  Pt will complete grooming ADL with min. A   Time Frame : 2 weeks  Pt will complete UB dressing with mod. A   Time Frame : 2 weeks                  Prognosis:  Fair  Positive Indicators:  facility support  Barriers to Discharge: Cognitive deficits, Functional strength deficits, Endurance deficits, Inability to safely perform ADLS, Pain    Subjective  Current Status Pt rec'd sup in bed, left in partial bed in chair, all needs met, call bell within reach, Rn updated  Prior Functional Status Pt unable to provide accurate PLOF at this time. Per chart, pt is living at ALF but has rapid decline in the past few months and they have begun a transition to SNF. Per chart review, Unfortunately her trajectory has been downwards where she has not been able to get out of bed by herself or walk in the last 1-1/2 months.  She eats pur??ed foods and drinks thin liquids.  However she often needs help drinking liquids which  may be contributing to some of her constipation.  She does wear incontinence pads but does rely on 2 people at the ALF to get her out of bed to a toilet in order to urinate and for bowel movements.       Services patient receives: OT, PT, SLP    Patient / Caregiver reports: modified barium swallow    Past Medical History:   Diagnosis Date   ??? ABMD (anterior basement membrane dystrophy)    ??? Allergic rhinitis    ??? Anxiety    ??? Arthritis    ??? Cataract     Nuclear sclerotic cataract OS, BCVA 20/20    ??? Depression    ??? Dry eyes    ??? Fibromyalgia    ??? Food intolerance    ??? GERD (gastroesophageal reflux disease)    ??? History of migraine     With Aura   ??? IBS (irritable bowel syndrome)    ??? 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 Osteoarthritis    ??? Ptosis     Intermittent ptosis OD   ??? PVD (posterior vitreous detachment), bilateral    ??? Schizoaffective disorder (CMS-HCC)    ??? Sjogren's syndrome (CMS-HCC)    ??? Small intestinal bacterial overgrowth (SIBO)     Social History     Tobacco Use   ??? Smoking status: Former Smoker     Years: 30.00     Quit date: 08/19/1993     Years since quitting: 27.3   ??? Smokeless tobacco: Never Used   Substance Use Topics   ??? Alcohol use: Yes     Alcohol/week: 1.0 standard drink     Types: 1 Glasses of wine per week     Comment: <1 / month       Past Surgical History:   Procedure Laterality Date   ??? blepharectomy Bilateral 10/11/2020   ??? CATARACT EXTRACTION Right 02/08/2013   ??? HERNIA REPAIR      x4   ??? HYSTERECTOMY      PARTIAL   ??? OCULOPLASTIC SURGERY Bilateral 10/11/2020    BLEPHAROPLASTY UPPER; W/EXCESS SKIN WT DOWN LID;    ??? PR FIX ECTROPION,ENTENSV LID REPAIR Bilateral 10/11/2020    Procedure: REPAIR OF ECTROPION; EXTENSIVE (EG, TARSAL STRIP OPERATIONS);  Surgeon: Claris Pong, MD;  [facility]: [facility];  Service: Ophthalmology   ??? PR REV UPPER EYELID W EXCESS SKIN Bilateral 10/11/2020    Procedure: BLEPHAROPLASTY UPPER; W/EXCESS SKIN WT DOWN LID;  Surgeon: Claris Pong, MD;  Location: [name];  Service: Ophthalmology   ??? SIGMOIDECTOMY     ??? SKIN BIOPSY     ??? TONSILLECTOMY AND ADENOIDECTOMY      Family History   Problem Relation Age of Onset   ??? Cancer Mother         OVARIAN   ??? Depression Mother    ??? Ovarian cancer Mother    ??? Thyroid disease Father    ??? Hypertension Father    ??? Depression Father    ??? Arthritis Father    ??? Heart disease Father    ??? Thyroid disease Sister    ??? Hypertension Sister    ??? Hypertension Maternal Grandmother    ??? Stroke Maternal Grandmother    ??? Hyperlipidemia Daughter    ??? No Known Problems Maternal Grandfather    ??? No Known Problems Paternal Grandmother    ??? No Known Problems Paternal Grandfather    ???  No Known Problems Sister    ??? No Known Problems Brother    ??? No Known Problems Maternal Aunt    ??? No Known Problems Maternal Uncle    ??? No Known Problems Paternal Aunt    ??? No Known Problems Paternal Uncle    ??? No Known Problems Other    ??? Alcohol abuse Neg Hx    ??? COPD Neg Hx    ??? Diabetes Neg Hx    ??? Drug abuse Neg Hx    ??? Hearing loss Neg Hx    ??? Vision loss Neg Hx    ??? Learning disabilities Neg Hx    ??? Melanoma Neg Hx    ??? Basal cell carcinoma Neg Hx    ??? Squamous cell carcinoma Neg Hx    ??? Amblyopia Neg Hx    ??? Blindness Neg Hx    ??? Cataracts Neg Hx    ??? Glaucoma Neg Hx    ??? Macular degeneration Neg Hx    ??? Retinal detachment Neg Hx    ??? Strabismus Neg Hx         Carbamazepine, Other, Amitiza [lubiprostone], Aspirin, Ciprofloxacin, Hctz [hydrochlorothiazide], Prednisone, Raloxifene, Shellfish containing products, Wellbutrin [bupropion hcl], Baclofen, Bismuth subsalicylate, and Escitalopram     Objective Findings  Precautions / Restrictions  Falls precautions    Weight Bearing  Non-applicable    Required Braces or Orthoses  Non-applicable    Communication Preference  Verbal    Pain  no c./o pain    Equipment / Environment  Vascular access (PIV, TLC, Port-a-cath, PICC), Patient not wearing mask for full session    Living Situation  Living Environment: Facility, Assisted living  Lives With: Care Staff  Home Living: One level home, Ramped entrance, Accessible bathroom  Equipment available at home:  (unknown)     Cognition   Orientation Level:  Disoriented to time   Arousal/Alertness:  Delayed responses to stimuli   Attention Span:  Attends with cues to redirect   Memory:  Unable to assess   Following Commands:  Follows one step commands without difficulty   Safety Judgment:  Unable to assess   Awareness of Errors:  Unable to assess   Problem Solving:  Unable to assess   Comments: Somnolent but oriented x 3, minimal awakening despite multimodal stimulation; per chart, impaired cognition at baseline    Vision / Hearing   Vision: No acute deficits identified     Hearing: No deficit identified         Hand Function:  Right Hand Function: Right hand function impaired  Right Hand Impairment: grip strength fair  Left Hand Function: Left hand function impaired  Left Hand Impairment: grip strength fair  Hand Dominance: Right    Skin Inspection:  Skin Inspection: Sacral ulcer    ROM / Strength:  UE ROM/Strength: Right Impaired/Limited, Left Impaired/Limited  RUE Impairment: Reduced strength, Limited AROM  LUE Impairment: Reduced strength, Limited AROM  LE ROM/Strength: Right Impaired/Limited, Left Impaired/Limited  RLE Impairment: Reduced strength, Limited AROM  LLE Impairment: Reduced strength, Limited AROM    Coordination:  Coordination: Not tested    Sensation:  RUE Sensation: RUE intact  LUE Sensation: LUE intact    Balance:  NA    Functional Mobility  Transfer Assistance Needed:  (not assessed)  Bed Mobility Assistance Needed: Yes  Bed Mobility - Needs Assistance: Max assist (rolling L/R: max. A; scoot higher in bed: max. Ax2)  Ambulation: NT  ADLs  ADLs: Total Assistance, Needs assistance with ADLs  ADLs - Needs Assistance: Feeding, Grooming, UB dressing, Bathing, Toileting, LB dressing  Feeding - Needs Assistance: Mod assist  Grooming - Needs Assistance: Mod assist  Bathing - Needs Assistance: Max assist  Toileting - Needs Assistance: Total Assist  UB Dressing - Needs Assistance: Mod assist  LB Dressing - Needs Assistance: Total Assist  IADLs: NT      Vitals / Orthostatics  At Rest: NAD  With Activity: NAD  Orthostatics: asymptomatic      Medical Staff Made Aware: Rn aware      Occupational Therapy Session Duration  OT Individual [mins]: 33         I attest that I have reviewed the above information.  Signed: Merril Abbe, OT  Filed 12/20/2020

## 2020-12-20 NOTE — Unmapped (Signed)
Adult Nutrition Assessment Note    Visit Type: RN Consult  Reason for Visit: Per Admission Nutrition Screen (Adult), Have you gained or lost 10 pounds in the past 3 months?      HPI & PMH:  Patient admitted for weakness, stage 2 sacral pressure injury present on admission, hemorrhoids, HTN, schizoaffective disorder, degenerative joint disease.     Anthropometric Data:  Height: 152.4 cm (5')   Admission weight: 49.2 kg (108 lb 7.5 oz)  Last recorded weight: 49.2 kg (108 lb 7.5 oz)  IBW: 45.45 kg  Percent IBW:    BMI: Body mass index is 21.18 kg/m??.   Usual Body Weight: Unable to obtain at this time     Weight history prior to admission: Recent weight gain per documentation.   Wt Readings from Last 10 Encounters:   12/19/20 49.2 kg (108 lb 7.5 oz)   11/14/20 45.4 kg (100 lb)   10/18/20 45.5 kg (100 lb 3.2 oz)   10/11/20 45.5 kg (100 lb 3.2 oz)   09/25/20 50.8 kg (112 lb)   09/06/20 51.7 kg (114 lb)   08/24/20 51.7 kg (114 lb)   08/08/20 49.9 kg (110 lb)   08/04/20 49.9 kg (110 lb)   07/27/20 49.9 kg (110 lb)        Weight changes this admission:   Last 5 Recorded Weights    12/19/20 1225   Weight: 49.2 kg (108 lb 7.5 oz)        Nutrition Focused Physical Exam:  Unable to complete at this time due to patient inability to participate in exam       NUTRITIONALLY RELEVANT DATA     Medications:   Nutritionally pertinent medications reviewed and evaluated for potential food and/or medication interactions.     Labs:   Nutritionally pertinent labs reviewed.     Nutrition History:   Dec 20, 2020: Prior to admission: Unable to obtain at this time. Patient was only able to answer very simple questions. She was very soft-spoken.     Allergies, Intolerances, Sensitivities, and/or Cultural/Religious Dietary Restrictions: none identified at this time     Current Nutrition:  NPO       Nutrition Orders   (From admission, onward)             Start     Ordered    12/19/20 1805  NPO Sips with meds; Medically necessary  Effective now Question Answer Comment   NPO Except: Sips with meds    Reason: Medically necessary        12/19/20 1804                   Nutritional Needs:   Healthy balance of carbohydrate, protein, and fat.       Malnutrition assessment not yet completed at this time due lack of nutrition history and inability to complete nutrition focused physical exam (NFPE).     GOALS and EVALUATION     ??? Patient to remain NPO and/or on a clear liquid diet less than 7 days before diet advancement. - New    Motivation, Barriers, and Compliance:  Evaluation of motivation, barriers, and compliance completed. No concerns identified at this time.     NUTRITION ASSESSMENT     Patient currently NPO and unable to meet estimated nutrition needs. She would likely benefit from advancing diet as appropriate to help meet estimated needs. Patient may require temporary nutrition support if diet is unable to be advanced.  Discharge Planning:   Monitor via CAPP rounds for any discharge planning needs.      NUTRITION INTERVENTIONS and RECOMMENDATION     1. Advance diet as appropriate.   2. Consider temporary nutrition support if patient's diet is unable to be advanced.     Follow-Up Parameters:   1-2 times per week (and more frequent as indicated)    Karie Chimera, MS, RD, LDN, CNSC  Pager # [phone]

## 2020-12-21 LAB — FOLATE: FOLATE: 19 ng/mL (ref >=5.4)

## 2020-12-21 LAB — CBC
HEMATOCRIT: 28.3 % — ABNORMAL LOW (ref 34.0–44.0)
HEMOGLOBIN: 9.6 g/dL — ABNORMAL LOW (ref 11.3–14.9)
MEAN CORPUSCULAR HEMOGLOBIN CONC: 33.8 g/dL (ref 32.0–36.0)
MEAN CORPUSCULAR HEMOGLOBIN: 32.4 pg (ref 25.9–32.4)
MEAN CORPUSCULAR VOLUME: 95.7 fL (ref 77.6–95.7)
MEAN PLATELET VOLUME: 6.4 fL — ABNORMAL LOW (ref 6.8–10.7)
PLATELET COUNT: 193 10*9/L (ref 150–450)
RED BLOOD CELL COUNT: 2.96 10*12/L — ABNORMAL LOW (ref 3.95–5.13)
RED CELL DISTRIBUTION WIDTH: 14 % (ref 12.2–15.2)
WBC ADJUSTED: 7 10*9/L (ref 3.6–11.2)

## 2020-12-21 LAB — COMPREHENSIVE METABOLIC PANEL
ALBUMIN: 2.3 g/dL — ABNORMAL LOW (ref 3.4–5.0)
ALKALINE PHOSPHATASE: 50 U/L (ref 46–116)
ALT (SGPT): 31 U/L (ref 10–49)
ANION GAP: 8 mmol/L (ref 5–14)
AST (SGOT): 32 U/L (ref <=34)
BILIRUBIN TOTAL: 0.6 mg/dL (ref 0.3–1.2)
BLOOD UREA NITROGEN: 26 mg/dL — ABNORMAL HIGH (ref 9–23)
BUN / CREAT RATIO: 52
CALCIUM: 8.3 mg/dL — ABNORMAL LOW (ref 8.7–10.4)
CHLORIDE: 108 mmol/L — ABNORMAL HIGH (ref 98–107)
CO2: 24 mmol/L (ref 20.0–31.0)
CREATININE: 0.5 mg/dL — ABNORMAL LOW
EGFR CKD-EPI AA FEMALE: >90 mL/min/{1.73_m2} (ref >=60)
EGFR CKD-EPI NON-AA FEMALE: >90 mL/min/{1.73_m2} (ref >=60)
GLUCOSE RANDOM: 91 mg/dL (ref 70–179)
POTASSIUM: 3.7 mmol/L (ref 3.4–4.8)
PROTEIN TOTAL: 5 g/dL — ABNORMAL LOW (ref 5.7–8.2)
SODIUM: 140 mmol/L (ref 135–145)

## 2020-12-21 LAB — IRON PANEL
IRON: <20 ug/dL — ABNORMAL LOW
TOTAL IRON BINDING CAPACITY: 195 ug/dL — ABNORMAL LOW (ref 250–425)

## 2020-12-21 LAB — VITAMIN B12: VITAMIN B-12: 590 pg/mL (ref 211–911)

## 2020-12-21 LAB — PHOSPHORUS: PHOSPHORUS: 3.3 mg/dL (ref 2.4–5.1)

## 2020-12-21 LAB — MAGNESIUM: MAGNESIUM: 2.5 mg/dL (ref 1.6–2.6)

## 2020-12-21 LAB — FERRITIN: FERRITIN: 261.5 ng/mL

## 2020-12-21 MED ADMIN — cholecalciferol (vitamin D3 25 mcg (1,000 units)) tablet 50 mcg: 50 ug | ORAL | @ 13:00:00

## 2020-12-21 MED ADMIN — cefTRIAXone (ROCEPHIN) 1 g in sodium chloride 0.9 % (NS) 100 mL IVPB-connector bag: 1 g | INTRAVENOUS | @ 13:00:00 | Stop: 2020-12-25

## 2020-12-21 MED ADMIN — acetaminophen (TYLENOL) tablet 500 mg: 500 mg | ORAL | @ 13:00:00

## 2020-12-21 MED ADMIN — amLODIPine (NORVASC) tablet 5 mg: 5 mg | ORAL | @ 13:00:00

## 2020-12-21 MED ADMIN — melatonin tablet 3 mg: 3 mg | ORAL | @ 21:00:00

## 2020-12-21 MED ADMIN — psyllium (METAMUCIL) 3.4 gram packet 1 packet: 1 | ORAL | @ 15:00:00

## 2020-12-21 MED ADMIN — acetaminophen (TYLENOL) tablet 500 mg: 500 mg | ORAL | @ 01:00:00

## 2020-12-21 MED ADMIN — diclofenac sodium (VOLTAREN) 1 % gel 4 g: 4 g | TOPICAL | @ 17:00:00

## 2020-12-21 MED ADMIN — acetaminophen (TYLENOL) tablet 500 mg: 500 mg | ORAL | @ 18:00:00

## 2020-12-21 MED ADMIN — diclofenac sodium (VOLTAREN) 1 % gel 4 g: 4 g | TOPICAL | @ 10:00:00

## 2020-12-21 MED ADMIN — hydrocortisone (ANUSOL-HC) suppository 25 mg: 25 mg | RECTAL | @ 02:00:00

## 2020-12-21 MED ADMIN — diclofenac sodium (VOLTAREN) 1 % gel 4 g: 4 g | TOPICAL | @ 21:00:00

## 2020-12-21 MED ADMIN — melatonin tablet 3 mg: 3 mg | ORAL | @ 01:00:00

## 2020-12-21 MED ADMIN — folic acid (FOLVITE) tablet 500 mcg: 500 ug | ORAL | @ 13:00:00

## 2020-12-21 MED ADMIN — clonazePAM (KlonoPIN) disintegrating tablet 0.25 mg: .25 mg | ORAL | @ 01:00:00

## 2020-12-21 MED ADMIN — sodium chloride (NS) 0.9 % infusion: 75 mL/h | INTRAVENOUS | @ 06:00:00

## 2020-12-21 MED ADMIN — diclofenac sodium (VOLTAREN) 1 % gel 4 g: 4 g | TOPICAL | @ 02:00:00

## 2020-12-21 MED ADMIN — sertraline (ZOLOFT) tablet 100 mg: 100 mg | ORAL | @ 13:00:00

## 2020-12-21 MED ADMIN — sodium chloride (NS) 0.9 % infusion: 75 mL/h | INTRAVENOUS | @ 18:00:00

## 2020-12-21 MED ADMIN — gabapentin (NEURONTIN) capsule 300 mg: 300 mg | ORAL | @ 01:00:00

## 2020-12-21 NOTE — Unmapped (Signed)
Patient drowsy A&OX4. Became more drowsy after administration of scheduled night time medication. Turned and repositioned Q2hours. Frequent loose b/m's, crusting applied to bottom. Fall precautions maintained.

## 2020-12-21 NOTE — Unmapped (Signed)
Patient continues to remain somulent throughout shift.  Patient was able to answer questions appropriatly this am but became less aware as the afternoon progressed. Diarrhea continues and WOCN to bedside to consult.  To clean patient well with spray cleanser and dimethicone wipes and slather patient with zinc oxide paste.  See WOCN notes.  Concern for aspiration and swallow study completed.  Per Speech patient must be totally awake and alert to be able to eat, recommend thin liquids, soft bite sized or mech soft. with total assist and crushed meds.  Pending MD orders.  POC remains active will continue to monitor for needs.  Problem: Skin Injury Risk Increased  Goal: Skin Health and Integrity  Outcome: Ongoing - Unchanged

## 2020-12-21 NOTE — Unmapped (Signed)
Daily Progress Note    Assessment/Plan:    Principal Problem:    Weakness  Active Problems:    Abnormality of gait    Hypertension, benign    Irritable bowel syndrome with both constipation and diarrhea  Resolved Problems:    * No resolved hospital problems. *        Wound 12/19/20 Coccyx (Active)   Wound Image   12/20/20 0800   Dressing Status      Soiled;Removed 12/20/20 0800   Wound Length (cm) 8 cm 12/20/20 0800   Wound Width (cm) 7 cm 12/20/20 0800   Wound Surface Area (cm^2) 56 cm^2 12/20/20 0800   Wound Bed Purple/maroon discoloration;Non-blanchable erythema;Pink 12/20/20 0800   Odor None 12/20/20 0800   Peri-wound Assessment      Clean;Dry;Intact;Blanchable erythema;Non-blanchable erythema 12/20/20 0800   Exudate Amnt      None 12/20/20 0800   Treatments Cleansed/Irrigation;Zinc based products 12/20/20 0800           [name] is a 80 y.o. female who presented to [facility] with Weakness.    ??  Delirium-Weakness (improved): Possibly in the setting of genitourinary infection, dehydration, and poor PO intake. Initially concerning for stroke given report of facial weakness still in facility.  Head CT here and evaluation by neurology shows no evidence for stroke. However she is severely debilitated, and according to her daughter this is worsening.  No altered mental status at time of admission. Chest XR did show opacity which may represent atelectasis, aspiration, or infection. Low suspicion for respiratory infection as patient is on room air, and without respiratory complaints. No leukocytosis. TSH normal. There was concern for Parkinsonism given her very soft voice and what appeared to be may be some cogwheeling on initial exam. Evaluated by SLP, no concern for aspiration. Per SLP, decreased voice intensity likely secondary to strength and endurance deficits. Recommended soft and bite sized diet (on pureed at ALF) and thin liquids.  - continue IVF NS 75 ml/hr pending improved PO intake  - PT/OT recommending 5x low, weekly  -Case management following for discharge planning to SNF     E. Coli UTI: UA negative for pyuria, though urine culture showing 50,000 to 100,000 CFU/mL Escherichia coli.  Though possibly colonization, in the setting of urinary retention with outlet obstruction requiring catheterization, worsening malaise, fatigue, and encephalopathy, will treat empirically for E. Coli UTI.  - continue 5-day course of IV Ceftriaxone, with likely transition to PO antibiotics tomorrow  -Continue Foley decompression for urinary retention likely related to stool ball/constipation. Once constipation/stool ball resolved, can then attempt voiding trial, possibly tomorrow.     Constipation: CT abd/pelv obtained in ED showed multiple dilated loops of air and fluid-filled small bowel, concerning for mechanical obstruction. Large rectal stool ball with associated rectal wall thickening. Manual disimpaction performed in ED. Given SMOG enema x 1. KUB today redemonstrated very large rectal stool ball with upstream dilatation of large and small bowel diffusely, similar to previous day's CT exam. Low suspicion for bowel obstruction with ongoing stooling.  Attempted bedside disimpaction 5/4, though unsuccessful.  - will give additional SMOG enema today  - will start Metamucil daily    Stage II sacral decubitus wound present on admission:  -Local wound care, WOCN following weekly.  ??  Hemorrhoids:  -continue Hydrocortisone suppository BID    Normocytic anemia: Per history, hemoglobin 10.8 at time of admission, previously 11.1 in 10/2020. No overt signs of bleeding. Iron panel reviewed, and with normal ferritin  and TIBC 195, likely anemia of chronic disease. Vitamin B12 and folate serum wnl.   - CBC daily  ??  Hypertension: Given elevated BUN and risk for acute kidney injury, will continue to hold home ARB for the time being. Blood pressures have remained stable.   ??  Schizoaffective disorder:  In review of chart, was transitioning off paliperidone.  Has been started on low-dose clonazepam by PCP, not psychiatrist.  - Will hold home Clonazepam today, pending ongoing improvement in her mentation.    ??  Degenerative joint disease:  - Will hold home Gabapentin today, pending ongoing improvement in her mentation.   ??  DVT prophylaxis: Enoxaparin  ??  Anticipated disposition:  Previously at ALF, though family was previously pursuing SNF placement prior to admission in the setting of disconditioning. PT/OT recommending 5x low, weekly. CM following. SNF placement pending.   ??  Code Status: Full Code    Subjective:  Patient seen and examined. No acute events overnight. Patient more alert and awake today compared to yesterday. Conversant, and answering questions appropriately. Stated she was having abdominal pain this morning. When asked of the location, she stated generalized. Did not endorse any nausea or vomiting. She did endorse some dyspnea this morning, though appeared in no acute distress. Weak cough demonstrated. Denied any chest discomfort. Asking for water, and reports appetite for breakfast. Daughter at bedside, and updated on plan of care. Patient appears in no acute distress.     Recent Results (from the past 24 hour(s))   POCT Glucose    Collection Time: 12/20/20  5:08 PM   Result Value Ref Range    Glucose, POC 105 70 - 179 mg/dL   POCT Glucose    Collection Time: 12/20/20  7:35 PM   Result Value Ref Range    Glucose, POC 117 70 - 179 mg/dL   CBC    Collection Time: 12/21/20  8:15 AM   Result Value Ref Range    WBC 7.0 3.6 - 11.2 10*9/L    RBC 2.96 (L) 3.95 - 5.13 10*12/L    HGB 9.6 (L) 11.3 - 14.9 g/dL    HCT 09.8 (L) 11.9 - 44.0 %    MCV 95.7 77.6 - 95.7 fL    MCH 32.4 25.9 - 32.4 pg    MCHC 33.8 32.0 - 36.0 g/dL    RDW 14.7 82.9 - 56.2 %    MPV 6.4 (L) 6.8 - 10.7 fL    Platelet 193 150 - 450 10*9/L   Comprehensive Metabolic Panel    Collection Time: 12/21/20  8:15 AM   Result Value Ref Range    Sodium 140 135 - 145 mmol/L    Potassium 3.7 3.4 - 4.8 mmol/L    Chloride 108 (H) 98 - 107 mmol/L    Anion Gap 8 5 - 14 mmol/L    CO2 24.0 20.0 - 31.0 mmol/L    BUN 26 (H) 9 - 23 mg/dL    Creatinine 1.30 (L) 0.60 - 0.80 mg/dL    BUN/Creatinine Ratio 52     EGFR CKD-EPI Non-African American, Female >90 >=60 mL/min/1.23m2    EGFR CKD-EPI African American, Female >90 >=60 mL/min/1.14m2    Glucose 91 70 - 179 mg/dL    Calcium 8.3 (L) 8.7 - 10.4 mg/dL    Albumin 2.3 (L) 3.4 - 5.0 g/dL    Total Protein 5.0 (L) 5.7 - 8.2 g/dL    Total Bilirubin 0.6 0.3 - 1.2 mg/dL    AST  32 <=34 U/L    ALT 31 10 - 49 U/L    Alkaline Phosphatase 50 46 - 116 U/L   Magnesium Level    Collection Time: 12/21/20  8:15 AM   Result Value Ref Range    Magnesium 2.5 1.6 - 2.6 mg/dL   Phosphorus Level    Collection Time: 12/21/20  8:15 AM   Result Value Ref Range    Phosphorus 3.3 2.4 - 5.1 mg/dL   Ferritin    Collection Time: 12/21/20  8:15 AM   Result Value Ref Range    Ferritin 261.5 7.3 - 270.7 ng/mL   Vitamin B12 Level    Collection Time: 12/21/20  8:15 AM   Result Value Ref Range    Vitamin B-12 590 211 - 911 pg/ml   Folate Level    Collection Time: 12/21/20 11:04 AM   Result Value Ref Range    Folate 19.0 >=5.4 ng/mL   Iron Panel    Collection Time: 12/21/20 11:04 AM   Result Value Ref Range    Iron <20 (L) 50 - 170 ug/dL    TIBC 161 (L) 096 - 045 ug/dL     Labs/Studies:  Labs and Studies from the last 24hrs per EMR and Reviewed    Objective:  Temp:  [36.5 ??C (97.7 ??F)-37.4 ??C (99.3 ??F)] 36.7 ??C (98.1 ??F)  Heart Rate:  [84-90] 90  Resp:  [16-18] 17  BP: (94-125)/(52-73) 112/71  SpO2:  [91 %-98 %] 91 %    General: Chronically ill, cachetic, alert, awake. Lying in bed resting.   Skin: Pallor, Warm, dry, and intact, with partially viewed pressure injury on coccyx.  Head: Normocephalic no visible masses, hair normal texture.  Eyes:  Conjunctivae clear without exudates or hemorrhage. Unable to participate with EOM's, Eyelids normal in appearance without swelling or lesions.  Cardiac: Heart rate and rhythm normal, no murmurs, gallops or rubs, no edema in extremities, 2+ radial and 2+ pedal pulses bilaterally  Psychiatric: appropriate mood and flat affect  Neurological: Awake, alert and oriented to person, place, and time. Soft voice, though improved from yesterday. Gait not visualized. No focal neuro deficits. Able to spontaneously move all 4 extremities.   GU: indwelling foley catheter with amber colored urine  GI: Abdomen soft and symmetric without distention, bowel sounds present and normoactive in all four quadrants. Noted protruding ventral hernia. No wincing or grimacing with palpation.   Respiratory: chest wall symmetric and without deformity, no respiratory distress, lung sounds CTA, without rales, rhonchi, or wheezes.

## 2020-12-21 NOTE — Unmapped (Addendum)
Delirium-Weakness: Likely in the setting of genitourinary infection, dehydration, and poor PO intake. Initially concerning for stroke given report of facial weakness still in facility.  Head CT here and evaluation by neurology showed no evidence for stroke. Came to hospital debilitated, however, no altered mental status at time of admission. Chest XR did show opacity which possibly represented atelectasis, aspiration, or infection. There was a low suspicion for respiratory infection as patient was on room air, and without respiratory complaints. No leukocytosis. TSH was normal.  Evaluated by SLP, no concern for aspiration. Per SLP, decreased voice intensity likely secondary to strength and endurance deficits. Recommended soft and bite sized diet (on pureed at ALF) and thin liquids. Patient was evaluated by PT/OT who recommended 5x low, weekly. Patient discharged to ***.        E. Coli UTI: UA was negative for pyuria, though urine culture showed 50,000 to 100,000 CFU/mL Escherichia coli.  Though possibly colonization, in the setting of urinary retention with outlet obstruction requiring catheterization, worsening malaise, fatigue, decision was made to treat empirically for E. Coli UTI. Completed a 3-day course of IV Ceftriaxone, and a 2-day course of PO Cefdinir.        Constipation: CT abd/pelv obtained in ED showed multiple dilated loops of air and fluid-filled small bowel, concerning for mechanical obstruction. Large rectal stool ball with associated rectal wall thickening. Manual disimpaction performed in ED. Given SMOG enema x 1. KUB today redemonstrated very large rectal stool ball with upstream dilatation of large and small bowel diffusely, similar to previous day's CT exam. Low suspicion for bowel obstruction with ongoing stooling.  Attempted bedside disimpaction 5/4, though unsuccessful. Was started on Metamucil daily, in addition to PRN suppositories for fecal stool ball ***.       Stage II sacral decubitus wound present on admission:  Was given local wound care, WOCN followed weekly.     Hemorrhoids:  Continued home Hydrocortisone suppository BID.     Normocytic anemia: Per history, hemoglobin 10.8 at time of admission, previously 11.1 in 10/2020. No overt signs of bleeding. Possibly hemodilution in the setting of IVF hydration. Iron panel reviewed, and with normal ferritin and TIBC 195, likely anemia of chronic disease. Vitamin B12 and folate serum were wnl.      Hypertension: Given elevated BUN and risk for acute kidney injury, home ARB was held during hospitalization. Amlodipine 5 mg daily was continued. Blood pressures remained stable. Discharged on ***.     Schizoaffective disorder:  In review of chart, patient was transitioning off paliperidone.  Had been started on low-dose clonazepam by PCP, not psychiatrist. Clonazepam was initially held pending improvement in patient's mentation. She was monitored for benzodiazepine withdrawal. Was resumed on ***.     Degenerative joint disease:  Home Gabapentin was held pending ongoing improvement in her mentation. Was resumed on ***.

## 2020-12-22 LAB — CBC
HEMATOCRIT: 26.1 % — ABNORMAL LOW (ref 34.0–44.0)
HEMOGLOBIN: 9 g/dL — ABNORMAL LOW (ref 11.3–14.9)
MEAN CORPUSCULAR HEMOGLOBIN CONC: 34.3 g/dL (ref 32.0–36.0)
MEAN CORPUSCULAR HEMOGLOBIN: 32.6 pg — ABNORMAL HIGH (ref 25.9–32.4)
MEAN CORPUSCULAR VOLUME: 95 fL (ref 77.6–95.7)
MEAN PLATELET VOLUME: 6.6 fL — ABNORMAL LOW (ref 6.8–10.7)
PLATELET COUNT: 169 10*9/L (ref 150–450)
RED BLOOD CELL COUNT: 2.75 10*12/L — ABNORMAL LOW (ref 3.95–5.13)
RED CELL DISTRIBUTION WIDTH: 14.2 % (ref 12.2–15.2)
WBC ADJUSTED: 6.5 10*9/L (ref 3.6–11.2)

## 2020-12-22 LAB — BASIC METABOLIC PANEL
ANION GAP: 6 mmol/L (ref 5–14)
BLOOD UREA NITROGEN: 24 mg/dL — ABNORMAL HIGH (ref 9–23)
BUN / CREAT RATIO: 56
CALCIUM: 8.1 mg/dL — ABNORMAL LOW (ref 8.7–10.4)
CHLORIDE: 110 mmol/L — ABNORMAL HIGH (ref 98–107)
CO2: 23 mmol/L (ref 20.0–31.0)
CREATININE: 0.43 mg/dL — ABNORMAL LOW
EGFR CKD-EPI AA FEMALE: >90 mL/min/{1.73_m2} (ref >=60)
EGFR CKD-EPI NON-AA FEMALE: >90 mL/min/{1.73_m2} (ref >=60)
GLUCOSE RANDOM: 94 mg/dL (ref 70–179)
POTASSIUM: 3.4 mmol/L (ref 3.4–4.8)
SODIUM: 139 mmol/L (ref 135–145)

## 2020-12-22 MED ADMIN — SMOG ENEMA: 240 mL | RECTAL | @ 10:00:00 | Stop: 2020-12-22

## 2020-12-22 MED ADMIN — diclofenac sodium (VOLTAREN) 1 % gel 4 g: 4 g | TOPICAL | @ 10:00:00

## 2020-12-22 MED ADMIN — hydrocortisone (ANUSOL-HC) suppository 25 mg: 25 mg | RECTAL | @ 01:00:00

## 2020-12-22 MED ADMIN — cholecalciferol (vitamin D3 25 mcg (1,000 units)) tablet 50 mcg: 50 ug | ORAL | @ 13:00:00

## 2020-12-22 MED ADMIN — bisacodyL (DULCOLAX) suppository 10 mg: 10 mg | RECTAL | @ 22:00:00 | Stop: 2020-12-22

## 2020-12-22 MED ADMIN — diclofenac sodium (VOLTAREN) 1 % gel 4 g: 4 g | TOPICAL | @ 22:00:00

## 2020-12-22 MED ADMIN — enoxaparin (LOVENOX) syringe 30 mg: 30 mg | SUBCUTANEOUS | @ 13:00:00

## 2020-12-22 MED ADMIN — hydrocortisone (ANUSOL-HC) suppository 25 mg: 25 mg | RECTAL | @ 13:00:00

## 2020-12-22 MED ADMIN — sodium chloride (NS) 0.9 % infusion: 75 mL/h | INTRAVENOUS | @ 10:00:00

## 2020-12-22 MED ADMIN — acetaminophen (TYLENOL) tablet 500 mg: 500 mg | ORAL | @ 13:00:00

## 2020-12-22 MED ADMIN — acetaminophen (TYLENOL) tablet 500 mg: 500 mg | ORAL | @ 01:00:00

## 2020-12-22 MED ADMIN — diclofenac sodium (VOLTAREN) 1 % gel 4 g: 4 g | TOPICAL | @ 16:00:00

## 2020-12-22 MED ADMIN — psyllium (METAMUCIL) 3.4 gram packet 1 packet: 1 | ORAL | @ 13:00:00

## 2020-12-22 MED ADMIN — cefTRIAXone (ROCEPHIN) 1 g in sodium chloride 0.9 % (NS) 100 mL IVPB-connector bag: 1 g | INTRAVENOUS | @ 13:00:00 | Stop: 2020-12-22

## 2020-12-22 MED ADMIN — amLODIPine (NORVASC) tablet 5 mg: 5 mg | ORAL | @ 13:00:00

## 2020-12-22 MED ADMIN — folic acid (FOLVITE) tablet 500 mcg: 500 ug | ORAL | @ 13:00:00

## 2020-12-22 MED ADMIN — carboxymethylcellulose sodium (THERATEARS) 0.25 % ophthalmic solution 2 drop: 2 [drp] | OPHTHALMIC | @ 22:00:00

## 2020-12-22 MED ADMIN — sertraline (ZOLOFT) tablet 100 mg: 100 mg | ORAL | @ 13:00:00

## 2020-12-22 MED ADMIN — diclofenac sodium (VOLTAREN) 1 % gel 4 g: 4 g | TOPICAL | @ 01:00:00

## 2020-12-22 MED ADMIN — acetaminophen (TYLENOL) tablet 500 mg: 500 mg | ORAL | @ 17:00:00

## 2020-12-22 NOTE — Unmapped (Signed)
Therapist called client's daughter as requested by client to schedule a follow up therapy appointment. Daughter reported client is in the hospital currently with a bowel obstruction, UTI, and dehydration. Daughter reported a stroke had been ruled out. Daughter stated that client is going to be kept in the hospital until a skilled nursing facility can be found for her to transfer to. Therapist stated they will call daughter again in a couple of weeks to determine if client is out of the hospital and able to participate in a therapy session at that time.

## 2020-12-22 NOTE — Unmapped (Signed)
A&OX4. Encouraged PO fluids, ate 25% of dinner. Frequent loose b/m's. Turned and repositioned Q2hours. Crusting completed to bottom. Fall precautions maintained.

## 2020-12-22 NOTE — Unmapped (Addendum)
Pt alert and oriented x4. Calm and cooperative. VSS. On RA. No complaints of pain. Having very frequent, loose BMs. Suppository given this evening, awaiting results. Q2 turns completed as tolerated by pt. Will continue to monitor.     Problem: Skin Injury Risk Increased  Goal: Skin Health and Integrity  Outcome: Ongoing - Unchanged  Intervention: Optimize Skin Protection  Recent Flowsheet Documentation  Taken 12/22/2020 1614 by Lavone Neri, RN  Pressure Reduction Techniques: weight shift assistance provided  Taken 12/22/2020 1600 by Lavone Neri, RN  Pressure Reduction Techniques: frequent weight shift encouraged  Taken 12/22/2020 1400 by Lavone Neri, RN  Pressure Reduction Techniques: frequent weight shift encouraged  Taken 12/22/2020 1315 by Lavone Neri, RN  Pressure Reduction Techniques: weight shift assistance provided  Taken 12/22/2020 1030 by Lavone Neri, RN  Pressure Reduction Techniques: weight shift assistance provided  Taken 12/22/2020 1000 by Lavone Neri, RN  Pressure Reduction Techniques: frequent weight shift encouraged  Taken 12/22/2020 0800 by Lavone Neri, RN  Pressure Reduction Techniques: frequent weight shift encouraged  Pressure Reduction Devices: pressure-redistributing mattress utilized  Skin Protection: incontinence pads utilized     Problem: Self-Care Deficit  Goal: Improved Ability to Complete Activities of Daily Living  Outcome: Ongoing - Unchanged     Problem: Impaired Wound Healing  Goal: Optimal Wound Healing  Outcome: Ongoing - Unchanged  Intervention: Promote Wound Healing  Recent Flowsheet Documentation  Taken 12/22/2020 1600 by Lavone Neri, RN  Activity Management: activity adjusted per tolerance  Taken 12/22/2020 1400 by Lavone Neri, RN  Activity Management: activity adjusted per tolerance  Taken 12/22/2020 1000 by Lavone Neri, RN  Activity Management: activity adjusted per tolerance  Taken 12/22/2020 0800 by Lavone Neri, RN  Activity Management: activity adjusted per tolerance     Problem: Adult Inpatient Plan of Care  Goal: Plan of Care Review  Outcome: Progressing  Goal: Patient-Specific Goal (Individualized)  Outcome: Progressing  Goal: Absence of Hospital-Acquired Illness or Injury  Outcome: Progressing  Intervention: Identify and Manage Fall Risk  Recent Flowsheet Documentation  Taken 12/22/2020 0800 by Lavone Neri, RN  Safety Interventions:  ??? bed alarm  ??? commode/urinal/bedpan at bedside  ??? fall reduction program maintained  ??? lighting adjusted for tasks/safety  ??? low bed  Intervention: Prevent Skin Injury  Recent Flowsheet Documentation  Taken 12/22/2020 0800 by Lavone Neri, RN  Skin Protection: incontinence pads utilized  Intervention: Prevent and Manage VTE (Venous Thromboembolism) Risk  Recent Flowsheet Documentation  Taken 12/22/2020 1600 by Lavone Neri, RN  Activity Management: activity adjusted per tolerance  Taken 12/22/2020 1400 by Lavone Neri, RN  Activity Management: activity adjusted per tolerance  Taken 12/22/2020 1000 by Lavone Neri, RN  Activity Management: activity adjusted per tolerance  Taken 12/22/2020 0800 by Lavone Neri, RN  Activity Management: activity adjusted per tolerance  VTE Prevention/Management:  ??? ambulation promoted  ??? anticoagulant therapy  Goal: Optimal Comfort and Wellbeing  Outcome: Progressing  Goal: Readiness for Transition of Care  Outcome: Progressing  Goal: Rounds/Family Conference  Outcome: Progressing     Problem: Fall Injury Risk  Goal: Absence of Fall and Fall-Related Injury  Outcome: Progressing  Intervention: Promote Injury-Free Environment  Recent Flowsheet Documentation  Taken 12/22/2020 0800 by Lavone Neri, RN  Safety Interventions:  ??? bed alarm  ??? commode/urinal/bedpan at bedside  ??? fall reduction program maintained  ??? lighting adjusted for tasks/safety  ??? low bed

## 2020-12-22 NOTE — Unmapped (Addendum)
Daily Progress Note    Assessment/Plan:    Principal Problem:    Weakness  Active Problems:    Abnormality of gait    Hypertension, benign    Irritable bowel syndrome with both constipation and diarrhea  Resolved Problems:    * No resolved hospital problems. *        Wound 12/19/20 Coccyx (Active)   Wound Image   12/20/20 0800   Dressing Status      Soiled;Removed 12/20/20 0800   Wound Length (cm) 8 cm 12/20/20 0800   Wound Width (cm) 7 cm 12/20/20 0800   Wound Surface Area (cm^2) 56 cm^2 12/20/20 0800   Wound Bed Purple/maroon discoloration;Non-blanchable erythema;Pink 12/20/20 0800   Odor None 12/20/20 0800   Peri-wound Assessment      Clean;Dry;Intact;Blanchable erythema;Non-blanchable erythema 12/20/20 0800   Exudate Amnt      None 12/20/20 0800   Treatments Cleansed/Irrigation;Zinc based products 12/20/20 0800           Cindy [name] is a 80 y.o. female who presented to [facility] with Weakness.    ??  Delirium-Weakness (improved): Possibly in the setting of genitourinary infection, dehydration, and poor PO intake. Initially concerning for stroke given report of facial weakness still in facility.  Head CT here and evaluation by neurology shows no evidence for stroke. However she is severely debilitated, and according to her daughter this is worsening.  No altered mental status at time of admission. Chest XR did show opacity which may represent atelectasis, aspiration, or infection. Low suspicion for respiratory infection as patient is on room air, and currently without respiratory complaints. No leukocytosis. TSH normal. There was concern for Parkinsonism given her very soft voice and what appeared to be may be some cogwheeling on initial exam. Evaluated by SLP, no concern for aspiration. Per SLP, decreased voice intensity likely secondary to strength and endurance deficits. Recommended soft and bite sized diet (on pureed at ALF) and thin liquids. Patient's mentation gradually improving, and appears to be nearing baseline.   - continue IVF NS 75 ml/hr pending improved PO intake  - PT/OT recommending 5x low, weekly  -Case management following for discharge planning to SNF     E. Coli UTI: UA negative for pyuria, though urine culture showing 50,000 to 100,000 CFU/mL Escherichia coli.  Though possibly colonization, in the setting of urinary retention requiring catheterization, worsening malaise, fatigue, and concerns for delirium upon admission, will treat empirically for E. Coli UTI.   - will discontinue IV Ceftriaxone (5/4-5/6)  - will order Cefdinir 300 mg BID to complete a 5-day course (5/7-5/9)  -Continue Foley decompression for urinary retention likely related to stool ball/constipation. Once stool ball resolved, can then attempt voiding trial, possibly tomorrow.     Constipation-Rectal stool ball: CT abd/pelv obtained in ED showed multiple dilated loops of air and fluid-filled small bowel, concerning for mechanical obstruction. Large rectal stool ball with associated rectal wall thickening. Manual disimpaction performed in ED. Given SMOG enema x 1. KUB redemonstrated very large rectal stool ball with upstream dilatation of large and small bowel diffusely, similar to previous day's CT exam. Low suspicion for bowel obstruction with ongoing stooling. Has had issues previously with incomplete rectal evaluation. Attempted bedside disimpaction 5/4, though unsuccessful. Given additional SMOG enema 5/5 with reported, small firm, stool. Having ongoing loose stools, however concerned that patient is having ongoing fecal impaction, with stool ball.   - continue Metamucil daily  - will give a OT rectal suppository today  Stage II sacral decubitus wound present on admission:  -Local wound care, WOCN following weekly.  ??  Hemorrhoids:  -continue Hydrocortisone suppository BID    Normocytic anemia: Per history, hemoglobin 10.8 at time of admission, previously 11.1 in 10/2020. No overt signs of bleeding. Possible hemodilution with IVF hydration. Iron panel reviewed, and with normal ferritin and TIBC 195, likely anemia of chronic disease. Vitamin B12 and folate serum wnl.   - CBC daily  ??  Hypertension: Given elevated BUN and risk for acute kidney injury, will continue to hold home ARB for the time being. Blood pressures have remained stable.   - continue Amlodipine 5 mg daily   ??  Schizoaffective disorder:  In review of chart, was transitioning off paliperidone.  Has been started on low-dose clonazepam by PCP, not psychiatrist.   - Will continue to hold home Clonazepam today, pending ongoing improvement in her mentation.   - If mentation continues to improve, possible resumption tomorrow, to avoid benzodiazepine withdrawal.   ??  Degenerative joint disease:  - Will continue to hold home Gabapentin, pending ongoing improvement in her mentation. Patient denying any discomfort at this time.    ??  DVT prophylaxis: Enoxaparin  ??  Anticipated disposition:  Previously at ALF, though family was previously pursuing SNF placement prior to admission in the setting of disconditioning. PT/OT recommending 5x low, weekly. CM following. SNF placement pending.   ??  Code Status: Full Code  ---------------------------------------------------------------------------------------------------------------------  Subjective:  Patient seen and examined. No acute events overnight. Demonstrating ongoing improvement in her mentation, appearing more awake and alert today on exam. Conversational and answering questions appropriately. Reports an appetite this morning, and asking for spaghetti, and to come off the pureed diet. Denies any pain. Asking to watch PBS this morning on her television. Nursing reporting ongoing loose stools, though reportedly had small, firm stool through the evening. Daughter, [name], at bedside and updated on plan of care. Patient appears in no acute distress.     Recent Results (from the past 24 hour(s))   Basic Metabolic Panel    Collection Time: 12/22/20  3:46 AM   Result Value Ref Range    Sodium 139 135 - 145 mmol/L    Potassium 3.4 3.4 - 4.8 mmol/L    Chloride 110 (H) 98 - 107 mmol/L    CO2 23.0 20.0 - 31.0 mmol/L    Anion Gap 6 5 - 14 mmol/L    BUN 24 (H) 9 - 23 mg/dL    Creatinine 6.04 (L) 0.60 - 0.80 mg/dL    BUN/Creatinine Ratio 56     EGFR CKD-EPI Non-African American, Female >90 >=60 mL/min/1.46m2    EGFR CKD-EPI African American, Female >90 >=60 mL/min/1.40m2    Glucose 94 70 - 179 mg/dL    Calcium 8.1 (L) 8.7 - 10.4 mg/dL   CBC    Collection Time: 12/22/20  3:46 AM   Result Value Ref Range    WBC 6.5 3.6 - 11.2 10*9/L    RBC 2.75 (L) 3.95 - 5.13 10*12/L    HGB 9.0 (L) 11.3 - 14.9 g/dL    HCT 54.0 (L) 98.1 - 44.0 %    MCV 95.0 77.6 - 95.7 fL    MCH 32.6 (H) 25.9 - 32.4 pg    MCHC 34.3 32.0 - 36.0 g/dL    RDW 19.1 47.8 - 29.5 %    MPV 6.6 (L) 6.8 - 10.7 fL    Platelet 169 150 - 450 10*9/L  Labs/Studies:  Labs and Studies from the last 24hrs per EMR and Reviewed    Objective:  Temp:  [36.7 ??C (98.1 ??F)-37 ??C (98.6 ??F)] 37 ??C (98.6 ??F)  Heart Rate:  [89-113] 113  Resp:  [17-18] 17  BP: (112-118)/(71-75) 118/75  SpO2:  [91 %-96 %] 96 %    General: Chronically ill, cachetic, alert, awake. Lying in bed resting.   Skin: Pallor, Warm, dry, and intact, with partially viewed pressure injury on coccyx.  Head: Normocephalic no visible masses, hair normal texture.  Eyes:  Conjunctivae clear without exudates or hemorrhage. EOM's intact, PERRLA, Eyelids normal in appearance without swelling or lesions.  Cardiac: Heart rate and rhythm normal, no murmurs, gallops or rubs, no edema in extremities, 2+ radial and 2+ pedal pulses bilaterally  Psychiatric: appropriate mood and flat affect  Neurological: Awake, alert and oriented to person, place, and time. Soft voice, though improved from yesterday. Gait not visualized. No focal neuro deficits. Able to spontaneously move all 4 extremities, though with limited mobility of BLE.    GU: indwelling foley catheter with amber colored urine  GI: Abdomen soft and symmetric without distention, bowel sounds present and normoactive in all four quadrants. Noted protruding ventral hernia. No wincing or grimacing with palpation.   Respiratory: chest wall symmetric and without deformity, no respiratory distress, lung sounds CTA, without rales, rhonchi, or wheezes.

## 2020-12-23 MED ADMIN — acetaminophen (TYLENOL) tablet 500 mg: 500 mg | ORAL | @ 13:00:00

## 2020-12-23 MED ADMIN — acetaminophen (TYLENOL) tablet 500 mg: 500 mg | ORAL | @ 01:00:00

## 2020-12-23 MED ADMIN — enoxaparin (LOVENOX) syringe 30 mg: 30 mg | SUBCUTANEOUS | @ 13:00:00

## 2020-12-23 MED ADMIN — psyllium (METAMUCIL) 3.4 gram packet 1 packet: 1 | ORAL | @ 13:00:00

## 2020-12-23 MED ADMIN — diclofenac sodium (VOLTAREN) 1 % gel 4 g: 4 g | TOPICAL | @ 10:00:00

## 2020-12-23 MED ADMIN — carboxymethylcellulose sodium (THERATEARS) 0.25 % ophthalmic solution 2 drop: 2 [drp] | OPHTHALMIC | @ 01:00:00

## 2020-12-23 MED ADMIN — sodium chloride (NS) 0.9 % infusion: 75 mL/h | INTRAVENOUS | @ 01:00:00

## 2020-12-23 MED ADMIN — cefdinir (OMNICEF) capsule 300 mg: 300 mg | ORAL | @ 13:00:00 | Stop: 2020-12-25

## 2020-12-23 MED ADMIN — melatonin tablet 3 mg: 3 mg | ORAL | @ 22:00:00

## 2020-12-23 MED ADMIN — carboxymethylcellulose sodium (THERATEARS) 0.25 % ophthalmic solution 2 drop: 2 [drp] | OPHTHALMIC | @ 17:00:00

## 2020-12-23 MED ADMIN — hydrocortisone (ANUSOL-HC) suppository 25 mg: 25 mg | RECTAL | @ 13:00:00

## 2020-12-23 MED ADMIN — carboxymethylcellulose sodium (THERATEARS) 0.25 % ophthalmic solution 2 drop: 2 [drp] | OPHTHALMIC | @ 06:00:00

## 2020-12-23 MED ADMIN — carboxymethylcellulose sodium (THERATEARS) 0.25 % ophthalmic solution 2 drop: 2 [drp] | OPHTHALMIC | @ 22:00:00

## 2020-12-23 MED ADMIN — diclofenac sodium (VOLTAREN) 1 % gel 4 g: 4 g | TOPICAL | @ 22:00:00

## 2020-12-23 MED ADMIN — cholecalciferol (vitamin D3 25 mcg (1,000 units)) tablet 50 mcg: 50 ug | ORAL | @ 13:00:00

## 2020-12-23 MED ADMIN — melatonin tablet 3 mg: 3 mg | ORAL | @ 01:00:00

## 2020-12-23 MED ADMIN — sertraline (ZOLOFT) tablet 100 mg: 100 mg | ORAL | @ 13:00:00

## 2020-12-23 MED ADMIN — carboxymethylcellulose sodium (THERATEARS) 0.25 % ophthalmic solution 2 drop: 2 [drp] | OPHTHALMIC | @ 10:00:00

## 2020-12-23 MED ADMIN — acetaminophen (TYLENOL) tablet 500 mg: 500 mg | ORAL | @ 17:00:00

## 2020-12-23 MED ADMIN — diclofenac sodium (VOLTAREN) 1 % gel 4 g: 4 g | TOPICAL | @ 01:00:00

## 2020-12-23 MED ADMIN — hydrocortisone (ANUSOL-HC) suppository 25 mg: 25 mg | RECTAL | @ 01:00:00

## 2020-12-23 MED ADMIN — amLODIPine (NORVASC) tablet 5 mg: 5 mg | ORAL | @ 13:00:00

## 2020-12-23 MED ADMIN — folic acid (FOLVITE) tablet 500 mcg: 500 ug | ORAL | @ 13:00:00

## 2020-12-23 NOTE — Unmapped (Signed)
Daily Progress Note    Assessment/Plan:    Principal Problem:    Weakness  Active Problems:    Abnormality of gait    Hypertension, benign    Irritable bowel syndrome with both constipation and diarrhea  Resolved Problems:    * No resolved hospital problems. *        Wound 12/19/20 Coccyx (Active)   Wound Image   12/20/20 0800   Dressing Status      Soiled;Removed 12/20/20 0800   Wound Length (cm) 8 cm 12/20/20 0800   Wound Width (cm) 7 cm 12/20/20 0800   Wound Surface Area (cm^2) 56 cm^2 12/20/20 0800   Wound Bed Purple/maroon discoloration;Non-blanchable erythema;Pink 12/20/20 0800   Odor None 12/20/20 0800   Peri-wound Assessment      Clean;Dry;Intact;Blanchable erythema;Non-blanchable erythema 12/20/20 0800   Exudate Amnt      None 12/20/20 0800   Treatments Cleansed/Irrigation;Zinc based products 12/20/20 0800           Cindy [name] is a 80 y.o. female who presented to [facility] with Weakness.    ??Delirium-Weakness (improved): Possibly in the setting of genitourinary infection, dehydration, and poor PO intake. Initially concerning for stroke given report of facial weakness still in facility.  Head CT here and evaluation by neurology shows no evidence for stroke. However she is severely debilitated, and according to her daughter this is worsening.  No altered mental status at time of admission. Chest XR did show opacity which may represent atelectasis, aspiration, or infection. Low suspicion for respiratory infection as patient is on room air, and currently without respiratory complaints. No leukocytosis. TSH normal. There was concern for Parkinsonism given her very soft voice and what appeared to be may be some cogwheeling on initial exam. Evaluated by SLP, no concern for aspiration. Per SLP, decreased voice intensity likely secondary to strength and endurance deficits. Recommended soft and bite sized diet (on pureed at ALF) and thin liquids. Patient's mentation gradually improving, and appears to be nearing baseline.   - continue IVF NS 75 ml/hr pending improved PO intake  - PT/OT recommending 5x low, weekly  - Case management following for discharge planning to SNF   - Holding home clonazepam     E. Coli UTI: UA negative for pyuria, though urine culture showing 50,000 to 100,000 CFU/mL Escherichia coli.  Though possibly colonization, in the setting of urinary retention requiring catheterization, worsening malaise, fatigue, and concerns for delirium upon admission, will treat empirically for E. Coli UTI.   - will order Cefdinir 300 mg BID to complete a 5-day course (5/7-5/9)  - Continue Foley decompression for urinary retention likely related to stool ball/constipation. TOV around 5/11 (1 week after need for replacement), or sooner pending clinical progress     Constipation-Rectal stool ball: CT abd/pelv obtained in ED showed multiple dilated loops of air and fluid-filled small bowel, concerning for mechanical obstruction. Large rectal stool ball with associated rectal wall thickening. Manual disimpaction performed in ED. Given SMOG enema x 1. KUB redemonstrated very large rectal stool ball with upstream dilatation of large and small bowel diffusely, similar to previous day's CT exam. Low suspicion for bowel obstruction with ongoing stooling. Has had issues previously with incomplete rectal evaluation. Attempted bedside disimpaction 5/4, though unsuccessful. Given additional SMOG enema 5/5 with reported, small firm, stool. Having ongoing loose stools, however concerned that patient is having ongoing fecal impaction, with stool ball.   - Repeat KUB  - Consider tap water enema if no improvement  -  continue Metamucil daily    Stage II sacral decubitus wound present on admission:  -Local wound care, WOCN following weekly.  ??  Hemorrhoids:  -continue Hydrocortisone suppository BID    Normocytic anemia: Per history, hemoglobin 10.8 at time of admission, previously 11.1 in 10/2020. No overt signs of bleeding. Possible hemodilution with IVF hydration. Iron panel reviewed, and with normal ferritin and TIBC 195, likely anemia of chronic disease. Vitamin B12 and folate serum wnl.   - CBC daily  ??  Hypertension: Given elevated BUN and risk for acute kidney injury, will continue to hold home ARB for the time being. Blood pressures have remained stable.   - continue Amlodipine 5 mg daily   ??  Schizoaffective disorder:  In review of chart, was transitioning off paliperidone.  Has been started on low-dose clonazepam by PCP, not psychiatrist.   - Continue to hold home clonazepam, no sign of benzodiazapine withdrawal   ??   Degenerative joint disease:  - Will continue to hold home Gabapentin, pending ongoing improvement in her mentation. Patient denying any discomfort at this time.    ??  DVT prophylaxis: Enoxaparin  ??  Anticipated disposition:  Previously at ALF, though family was previously pursuing SNF placement prior to admission in the setting of disconditioning. PT/OT recommending 5x low, weekly. CM following. SNF placement pending.   ??  Code Status: Full Code  ---------------------------------------------------------------------------------------------------------------------  Subjective:    No acute events overnight.  Appears confused this morning, but asking to eat breakfast appropriately.  Continues to have small, loose bowel movements.      No results found for this or any previous visit (from the past 24 hour(s)).  Labs/Studies:  Labs and Studies from the last 24hrs per EMR and Reviewed    Objective:  Temp:  [36.5 ??C (97.7 ??F)-37 ??C (98.6 ??F)] 37 ??C (98.6 ??F)  Heart Rate:  [85-102] 85  Resp:  [18-22] 18  BP: (138-155)/(76-78) 155/78  SpO2:  [92 %-98 %] 92 %    General: Elderly chronically ill appearing female, no acute distress.  Frail  HEENT: MMM, oropharynx clear  Neck: Supple, no visible JVD  Cardiac: RRR, no M/R/G  Pulmonary: Normal work of breathing, no wheezes or crackles  Abdomen: Hernia present.  Soft, nontender   Skin: No jaundice. No rashes or lesions.  Extremities: No edema, cyanosis, or clubbing. Warm  GU: Foley in place  Neuro: Moves all extremities   Psych: Disoriented and waxing / waning attentiveness

## 2020-12-23 NOTE — Unmapped (Signed)
Pt able eat a little less than 50% percent of her dinner, able to feed self after set up. Pt turned every two hours as tolerated. Pt had couple episodes of small loose Bms throughout shift.   Problem: Adult Inpatient Plan of Care  Goal: Absence of Hospital-Acquired Illness or Injury  Outcome: Progressing  Intervention: Prevent Skin Injury  Recent Flowsheet Documentation  Taken 12/22/2020 2000 by Armanda Magic, RN  Skin Protection:   adhesive use limited   incontinence pads utilized  Intervention: Prevent and Manage VTE (Venous Thromboembolism) Risk  Recent Flowsheet Documentation  Taken 12/22/2020 2107 by Armanda Magic, RN  VTE Prevention/Management:   anticoagulant therapy   fluids promoted   intravenous hydration  Goal: Optimal Comfort and Wellbeing  Outcome: Progressing     Problem: Fall Injury Risk  Goal: Absence of Fall and Fall-Related Injury  Outcome: Progressing     Problem: Skin Injury Risk Increased  Goal: Skin Health and Integrity  Outcome: Ongoing - Unchanged  Intervention: Optimize Skin Protection  Recent Flowsheet Documentation  Taken 12/22/2020 2000 by Armanda Magic, RN  Pressure Reduction Techniques:   frequent weight shift encouraged   heels elevated off bed   positioned off wounds  Skin Protection:   adhesive use limited   incontinence pads utilized

## 2020-12-23 NOTE — Unmapped (Signed)
Consult received and services are being managed by the care management team.   12/23/2020 8:18 AM  Being followed by case management. See consult note dated 12/20/20.

## 2020-12-24 MED ADMIN — acetaminophen (TYLENOL) tablet 500 mg: 500 mg | ORAL | @ 01:00:00

## 2020-12-24 MED ADMIN — cefdinir (OMNICEF) capsule 300 mg: 300 mg | ORAL | @ 13:00:00 | Stop: 2020-12-24

## 2020-12-24 MED ADMIN — cholecalciferol (vitamin D3 25 mcg (1,000 units)) tablet 50 mcg: 50 ug | ORAL | @ 13:00:00

## 2020-12-24 MED ADMIN — hydrocortisone (ANUSOL-HC) suppository 25 mg: 25 mg | RECTAL | @ 13:00:00

## 2020-12-24 MED ADMIN — diclofenac sodium (VOLTAREN) 1 % gel 4 g: 4 g | TOPICAL | @ 22:00:00

## 2020-12-24 MED ADMIN — folic acid (FOLVITE) tablet 500 mcg: 500 ug | ORAL | @ 13:00:00

## 2020-12-24 MED ADMIN — carboxymethylcellulose sodium (THERATEARS) 0.25 % ophthalmic solution 2 drop: 2 [drp] | OPHTHALMIC | @ 17:00:00

## 2020-12-24 MED ADMIN — amLODIPine (NORVASC) tablet 5 mg: 5 mg | ORAL | @ 13:00:00

## 2020-12-24 MED ADMIN — sertraline (ZOLOFT) tablet 100 mg: 100 mg | ORAL | @ 13:00:00

## 2020-12-24 MED ADMIN — hydrocortisone (ANUSOL-HC) suppository 25 mg: 25 mg | RECTAL | @ 01:00:00

## 2020-12-24 MED ADMIN — carboxymethylcellulose sodium (THERATEARS) 0.25 % ophthalmic solution 2 drop: 2 [drp] | OPHTHALMIC | @ 10:00:00

## 2020-12-24 MED ADMIN — enoxaparin (LOVENOX) syringe 30 mg: 30 mg | SUBCUTANEOUS | @ 13:00:00

## 2020-12-24 MED ADMIN — psyllium (METAMUCIL) 3.4 gram packet 1 packet: 1 | ORAL | @ 13:00:00

## 2020-12-24 MED ADMIN — acetaminophen (TYLENOL) tablet 500 mg: 500 mg | ORAL | @ 17:00:00

## 2020-12-24 MED ADMIN — carboxymethylcellulose sodium (THERATEARS) 0.25 % ophthalmic solution 2 drop: 2 [drp] | OPHTHALMIC | @ 06:00:00

## 2020-12-24 MED ADMIN — sodium chloride (NS) 0.9 % infusion: 75 mL/h | INTRAVENOUS | @ 04:00:00

## 2020-12-24 MED ADMIN — carboxymethylcellulose sodium (THERATEARS) 0.25 % ophthalmic solution 2 drop: 2 [drp] | OPHTHALMIC | @ 01:00:00

## 2020-12-24 MED ADMIN — diclofenac sodium (VOLTAREN) 1 % gel 4 g: 4 g | TOPICAL | @ 10:00:00

## 2020-12-24 MED ADMIN — cefdinir (OMNICEF) capsule 300 mg: 300 mg | ORAL | @ 01:00:00 | Stop: 2020-12-25

## 2020-12-24 MED ADMIN — acetaminophen (TYLENOL) tablet 500 mg: 500 mg | ORAL | @ 13:00:00

## 2020-12-24 MED ADMIN — diclofenac sodium (VOLTAREN) 1 % gel 4 g: 4 g | TOPICAL | @ 17:00:00

## 2020-12-24 MED ADMIN — diclofenac sodium (VOLTAREN) 1 % gel 4 g: 4 g | TOPICAL | @ 01:00:00

## 2020-12-24 MED ADMIN — carboxymethylcellulose sodium (THERATEARS) 0.25 % ophthalmic solution 2 drop: 2 [drp] | OPHTHALMIC | @ 22:00:00

## 2020-12-24 MED ADMIN — carboxymethylcellulose sodium (THERATEARS) 0.25 % ophthalmic solution 2 drop: 2 [drp] | OPHTHALMIC | @ 13:00:00

## 2020-12-24 MED ADMIN — sodium chloride (NS) 0.9 % infusion: 75 mL/h | INTRAVENOUS | @ 17:00:00

## 2020-12-24 NOTE — Unmapped (Signed)
Daily Progress Note    Assessment/Plan:    Principal Problem:    Weakness  Active Problems:    Abnormality of gait    Hypertension, benign    Irritable bowel syndrome with both constipation and diarrhea  Resolved Problems:    * No resolved hospital problems. *        Wound 12/19/20 Coccyx (Active)   Wound Image   12/20/20 0800   Dressing Status      Soiled;Removed 12/20/20 0800   Wound Length (cm) 8 cm 12/20/20 0800   Wound Width (cm) 7 cm 12/20/20 0800   Wound Surface Area (cm^2) 56 cm^2 12/20/20 0800   Wound Bed Purple/maroon discoloration;Non-blanchable erythema;Pink 12/20/20 0800   Odor None 12/20/20 0800   Peri-wound Assessment      Clean;Dry;Intact;Blanchable erythema;Non-blanchable erythema 12/20/20 0800   Exudate Amnt      None 12/20/20 0800   Treatments Cleansed/Irrigation;Zinc based products 12/20/20 0800           Cindy [name] is a 80 y.o. female who presented to [facility] with Weakness.    ??Delirium-Weakness (improved): Possibly in the setting of genitourinary infection, dehydration, and poor PO intake. Initially concerning for stroke given report of facial weakness still in facility.  Head CT here and evaluation by neurology shows no evidence for stroke. However she is severely debilitated, and according to her daughter this is worsening.  No altered mental status at time of admission. Chest XR did show opacity which may represent atelectasis, aspiration, or infection. Low suspicion for respiratory infection as patient is on room air, and currently without respiratory complaints. No leukocytosis. TSH normal. There was concern for Parkinsonism given her very soft voice and what appeared to be may be some cogwheeling on initial exam. Evaluated by SLP, no concern for aspiration. Per SLP, decreased voice intensity likely secondary to strength and endurance deficits. Recommended soft and bite sized diet (on pureed at ALF) and thin liquids. Patient's mentation gradually improving, and appears to be nearing baseline.   - continue IVF NS 75 ml/hr pending improved PO intake  - PT/OT recommending 5x low, weekly  - Case management following for discharge planning to SNF   - Holding home clonazepam     E. Coli UTI: UA negative for pyuria, though urine culture showing 50,000 to 100,000 CFU/mL Escherichia coli.  Though possibly colonization, in the setting of urinary retention requiring catheterization, worsening malaise, fatigue, and concerns for delirium upon admission, will treat empirically for E. Coli UTI.   - will order Cefdinir 300 mg BID to complete a 5-day course (5/7-5/9)  - Continue Foley decompression for urinary retention likely related to stool ball/constipation. TOV around 5/11 (1 week after need for replacement), or sooner pending clinical progress     Constipation-Rectal stool ball: CT abd/pelv obtained in ED showed multiple dilated loops of air and fluid-filled small bowel, concerning for mechanical obstruction. Large rectal stool ball with associated rectal wall thickening. Manual disimpaction performed in ED. Given SMOG enema x 1. KUB redemonstrated very large rectal stool ball with upstream dilatation of large and small bowel diffusely, similar to previous day's CT exam. Low suspicion for bowel obstruction with ongoing stooling. Has had issues previously with incomplete rectal evaluation. Attempted bedside disimpaction 5/4, though unsuccessful. Given additional SMOG enema 5/5 with reported, small firm, stool. Having ongoing loose stools, however concerned that patient is having ongoing fecal impaction, with stool ball.   - Trial tap water enema today   - continue Metamucil daily  Stage II sacral decubitus wound present on admission:  -Local wound care, WOCN following weekly.  ??  Hemorrhoids:  -continue Hydrocortisone suppository BID    Normocytic anemia: Per history, hemoglobin 10.8 at time of admission, previously 11.1 in 10/2020. No overt signs of bleeding. Possible hemodilution with IVF hydration. Iron panel reviewed, and with normal ferritin and TIBC 195, likely anemia of chronic disease. Vitamin B12 and folate serum wnl.   - CBC daily  ??  Hypertension: Given elevated BUN and risk for acute kidney injury, will continue to hold home ARB for the time being. Blood pressures have remained stable.   - continue Amlodipine 5 mg daily   ??  Schizoaffective disorder:  In review of chart, was transitioning off paliperidone.  Has been started on low-dose clonazepam by PCP, not psychiatrist.   - Continue to hold home clonazepam, no sign of benzodiazapine withdrawal   ??   Degenerative joint disease:  - Will continue to hold home Gabapentin, pending ongoing improvement in her mentation. Patient denying any discomfort at this time.    ??  DVT prophylaxis: Enoxaparin  ??  Anticipated disposition:  Previously at ALF, though family was previously pursuing SNF placement prior to admission in the setting of disconditioning. PT/OT recommending 5x low, weekly. CM following. SNF placement pending.   ??  Code Status: Full Code - Discussed with patient's daughter on 5/8 - confirmed full code, but after our discussion today, she would like to discuss further with her mother on possibly changing this.    ---------------------------------------------------------------------------------------------------------------------  Subjective:    No acute events overnight.  More alert this morning and able to answer questions more appropriately.  Continues to have loose bowel movements.  Suspect ongoing stool ball based on KUB.      No results found for this or any previous visit (from the past 24 hour(s)).  Labs/Studies:  Labs and Studies from the last 24hrs per EMR and Reviewed    Objective:  Temp:  [36.8 ??C (98.2 ??F)-37.1 ??C (98.8 ??F)] 36.8 ??C (98.2 ??F)  Heart Rate:  [81-105] 105  Resp:  [14-18] 14  BP: (124-168)/(76-96) 168/96  SpO2:  [94 %-95 %] 94 %    General: Elderly chronically ill appearing female, no acute distress.  Frail  HEENT: MMM, oropharynx clear  Neck: Supple, no visible JVD  Cardiac: RRR, no M/R/G  Pulmonary: Normal work of breathing, no wheezes or crackles  Abdomen: Hernia present.  Soft, nontender   Skin: No jaundice. No rashes or lesions.  Extremities: No edema, cyanosis, or clubbing. Warm  GU: Foley in place  Neuro: Moves all extremities   Psych: Disoriented and waxing / waning attentiveness

## 2020-12-24 NOTE — Unmapped (Signed)
No acute events overnight. Patient pleasant and cooperative for staff. A&Ox3. Q2 turns maintained. Loose BM's overnight, passing gas. VSS. Safety precautions maintained. WCTM      Problem: Self-Care Deficit  Goal: Improved Ability to Complete Activities of Daily Living  Outcome: Ongoing - Unchanged     Problem: Skin Injury Risk Increased  Goal: Skin Health and Integrity  Outcome: Progressing  Intervention: Optimize Skin Protection  Recent Flowsheet Documentation  Taken 12/23/2020 2117 by Elias Else, RN  Pressure Reduction Techniques: frequent weight shift encouraged  Head of Bed (HOB) Positioning: HOB at 20-30 degrees  Pressure Reduction Devices: pressure-redistributing mattress utilized  Skin Protection:   adhesive use limited   cleansing with dimethicone incontinence wipes   incontinence pads utilized     Problem: Adult Inpatient Plan of Care  Goal: Plan of Care Review  Outcome: Progressing  Goal: Patient-Specific Goal (Individualized)  Outcome: Progressing  Goal: Absence of Hospital-Acquired Illness or Injury  Outcome: Progressing  Intervention: Identify and Manage Fall Risk  Recent Flowsheet Documentation  Taken 12/23/2020 2117 by Elias Else, RN  Safety Interventions:   aspiration precautions   bed alarm   environmental modification   fall reduction program maintained   lighting adjusted for tasks/safety   low bed   nonskid shoes/slippers when out of bed  Intervention: Prevent Skin Injury  Recent Flowsheet Documentation  Taken 12/23/2020 2117 by Elias Else, RN  Skin Protection:   adhesive use limited   cleansing with dimethicone incontinence wipes   incontinence pads utilized  Intervention: Prevent and Manage VTE (Venous Thromboembolism) Risk  Recent Flowsheet Documentation  Taken 12/23/2020 2117 by Elias Else, RN  Activity Management: activity adjusted per tolerance  VTE Prevention/Management:   intravenous hydration   fluids promoted  Goal: Optimal Comfort and Wellbeing  Outcome: Progressing  Goal: Readiness for Transition of Care  Outcome: Progressing  Goal: Rounds/Family Conference  Outcome: Progressing     Problem: Impaired Wound Healing  Goal: Optimal Wound Healing  Outcome: Progressing  Intervention: Promote Wound Healing  Recent Flowsheet Documentation  Taken 12/23/2020 2117 by Elias Else, RN  Activity Management: activity adjusted per tolerance     Problem: Fall Injury Risk  Goal: Absence of Fall and Fall-Related Injury  Outcome: Progressing  Intervention: Promote Injury-Free Environment  Recent Flowsheet Documentation  Taken 12/23/2020 2117 by Elias Else, RN  Safety Interventions:   aspiration precautions   bed alarm   environmental modification   fall reduction program maintained   lighting adjusted for tasks/safety   low bed   nonskid shoes/slippers when out of bed

## 2020-12-24 NOTE — Unmapped (Signed)
Problem: Skin Injury Risk Increased  Goal: Skin Health and Integrity  Outcome: Ongoing - Unchanged  Intervention: Optimize Skin Protection  Recent Flowsheet Documentation  Taken 12/24/2020 1000 by Cindy Salter, RN  Pressure Reduction Techniques: frequent weight shift encouraged  Taken 12/24/2020 0800 by Cindy Salter, RN  Pressure Reduction Techniques: frequent weight shift encouraged     Problem: Adult Inpatient Plan of Care  Goal: Plan of Care Review  Outcome: Ongoing - Unchanged  Goal: Patient-Specific Goal (Individualized)  Outcome: Ongoing - Unchanged  Goal: Absence of Hospital-Acquired Illness or Injury  Outcome: Ongoing - Unchanged  Intervention: Identify and Manage Fall Risk  Recent Flowsheet Documentation  Taken 12/24/2020 0800 by Cindy Salter, RN  Safety Interventions:   fall reduction program maintained   low bed  Goal: Optimal Comfort and Wellbeing  Outcome: Ongoing - Unchanged  Goal: Readiness for Transition of Care  Outcome: Ongoing - Unchanged  Goal: Rounds/Family Conference  Outcome: Ongoing - Unchanged     Problem: Self-Care Deficit  Goal: Improved Ability to Complete Activities of Daily Living  Outcome: Ongoing - Unchanged     Problem: Impaired Wound Healing  Goal: Optimal Wound Healing  Outcome: Ongoing - Unchanged     Problem: Fall Injury Risk  Goal: Absence of Fall and Fall-Related Injury  Outcome: Ongoing - Unchanged  Intervention: Promote Injury-Free Environment  Recent Flowsheet Documentation  Taken 12/24/2020 0800 by Cindy Salter, RN  Safety Interventions:   fall reduction program maintained   low bed

## 2020-12-25 LAB — CBC W/ AUTO DIFF
BASOPHILS ABSOLUTE COUNT: 0 10*9/L (ref 0.0–0.1)
BASOPHILS RELATIVE PERCENT: 0.3 %
EOSINOPHILS ABSOLUTE COUNT: 0.1 10*9/L (ref 0.0–0.5)
EOSINOPHILS RELATIVE PERCENT: 2.1 %
HEMATOCRIT: 27 % — ABNORMAL LOW (ref 34.0–44.0)
HEMOGLOBIN: 9.3 g/dL — ABNORMAL LOW (ref 11.3–14.9)
LYMPHOCYTES ABSOLUTE COUNT: 1 10*9/L — ABNORMAL LOW (ref 1.1–3.6)
LYMPHOCYTES RELATIVE PERCENT: 14.3 %
MEAN CORPUSCULAR HEMOGLOBIN CONC: 34.6 g/dL (ref 32.0–36.0)
MEAN CORPUSCULAR HEMOGLOBIN: 31.8 pg (ref 25.9–32.4)
MEAN CORPUSCULAR VOLUME: 92.2 fL (ref 77.6–95.7)
MEAN PLATELET VOLUME: 6.6 fL — ABNORMAL LOW (ref 6.8–10.7)
MONOCYTES ABSOLUTE COUNT: 0.8 10*9/L (ref 0.3–0.8)
MONOCYTES RELATIVE PERCENT: 11.5 %
NEUTROPHILS ABSOLUTE COUNT: 4.8 10*9/L (ref 1.8–7.8)
NEUTROPHILS RELATIVE PERCENT: 71.8 %
PLATELET COUNT: 191 10*9/L (ref 150–450)
RED BLOOD CELL COUNT: 2.93 10*12/L — ABNORMAL LOW (ref 3.95–5.13)
RED CELL DISTRIBUTION WIDTH: 13.7 % (ref 12.2–15.2)
WBC ADJUSTED: 6.7 10*9/L (ref 3.6–11.2)

## 2020-12-25 LAB — BASIC METABOLIC PANEL
ANION GAP: 7 mmol/L (ref 5–14)
ANION GAP: 6 mmol/L (ref 5–14)
BLOOD UREA NITROGEN: 8 mg/dL — ABNORMAL LOW (ref 9–23)
BLOOD UREA NITROGEN: 9 mg/dL (ref 9–23)
BUN / CREAT RATIO: 24
BUN / CREAT RATIO: 26
CALCIUM: 8 mg/dL — ABNORMAL LOW (ref 8.7–10.4)
CALCIUM: 7.9 mg/dL — ABNORMAL LOW (ref 8.7–10.4)
CHLORIDE: 107 mmol/L (ref 98–107)
CHLORIDE: 109 mmol/L — ABNORMAL HIGH (ref 98–107)
CO2: 23 mmol/L (ref 20.0–31.0)
CO2: 24 mmol/L (ref 20.0–31.0)
CREATININE: 0.33 mg/dL — ABNORMAL LOW
CREATININE: 0.35 mg/dL — ABNORMAL LOW
EGFR CKD-EPI AA FEMALE: >90 mL/min/{1.73_m2} (ref >=60)
EGFR CKD-EPI AA FEMALE: >90 mL/min/{1.73_m2} (ref >=60)
EGFR CKD-EPI NON-AA FEMALE: >90 mL/min/{1.73_m2} (ref >=60)
EGFR CKD-EPI NON-AA FEMALE: >90 mL/min/{1.73_m2} (ref >=60)
GLUCOSE RANDOM: 85 mg/dL (ref 70–179)
GLUCOSE RANDOM: 98 mg/dL (ref 70–179)
POTASSIUM: 2.7 mmol/L — ABNORMAL LOW (ref 3.4–4.8)
POTASSIUM: 3.3 mmol/L — ABNORMAL LOW (ref 3.4–4.8)
SODIUM: 137 mmol/L (ref 135–145)
SODIUM: 139 mmol/L (ref 135–145)

## 2020-12-25 LAB — MAGNESIUM
MAGNESIUM: 1.3 mg/dL — ABNORMAL LOW (ref 1.6–2.6)
MAGNESIUM: 2.1 mg/dL (ref 1.6–2.6)

## 2020-12-25 MED ADMIN — potassium chloride (KLOR-CON) packet 40 mEq: 40 meq | ORAL | @ 16:00:00 | Stop: 2020-12-25

## 2020-12-25 MED ADMIN — carboxymethylcellulose sodium (THERATEARS) 0.25 % ophthalmic solution 2 drop: 2 [drp] | OPHTHALMIC | @ 02:00:00

## 2020-12-25 MED ADMIN — hydrocortisone (ANUSOL-HC) suppository 25 mg: 25 mg | RECTAL | @ 02:00:00

## 2020-12-25 MED ADMIN — diclofenac sodium (VOLTAREN) 1 % gel 4 g: 4 g | TOPICAL | @ 02:00:00

## 2020-12-25 MED ADMIN — magnesium sulfate 2gm/50mL IVPB: 2 g | INTRAVENOUS | @ 16:00:00 | Stop: 2020-12-25

## 2020-12-25 MED ADMIN — potassium chloride (KLOR-CON) packet 40 mEq: 40 meq | ORAL | @ 20:00:00 | Stop: 2020-12-25

## 2020-12-25 MED ADMIN — acetaminophen (TYLENOL) tablet 500 mg: 500 mg | ORAL | @ 13:00:00

## 2020-12-25 MED ADMIN — cholecalciferol (vitamin D3 25 mcg (1,000 units)) tablet 50 mcg: 50 ug | ORAL | @ 13:00:00

## 2020-12-25 MED ADMIN — carboxymethylcellulose sodium (THERATEARS) 0.25 % ophthalmic solution 2 drop: 2 [drp] | OPHTHALMIC | @ 13:00:00

## 2020-12-25 MED ADMIN — diclofenac sodium (VOLTAREN) 1 % gel 4 g: 4 g | TOPICAL | @ 16:00:00

## 2020-12-25 MED ADMIN — diclofenac sodium (VOLTAREN) 1 % gel 4 g: 4 g | TOPICAL | @ 10:00:00

## 2020-12-25 MED ADMIN — cefdinir (OMNICEF) capsule 300 mg: 300 mg | ORAL | @ 02:00:00 | Stop: 2020-12-24

## 2020-12-25 MED ADMIN — sodium chloride (NS) 0.9 % infusion: 75 mL/h | INTRAVENOUS | @ 10:00:00 | Stop: 2020-12-25

## 2020-12-25 MED ADMIN — acetaminophen (TYLENOL) tablet 500 mg: 500 mg | ORAL | @ 02:00:00

## 2020-12-25 MED ADMIN — polyethylene glycol (MIRALAX) packet 17 g: 17 g | ORAL | @ 19:00:00

## 2020-12-25 MED ADMIN — amLODIPine (NORVASC) tablet 5 mg: 5 mg | ORAL | @ 13:00:00

## 2020-12-25 MED ADMIN — hydrocortisone (ANUSOL-HC) suppository 25 mg: 25 mg | RECTAL | @ 13:00:00

## 2020-12-25 MED ADMIN — acetaminophen (TYLENOL) tablet 500 mg: 500 mg | ORAL | @ 19:00:00

## 2020-12-25 MED ADMIN — sertraline (ZOLOFT) tablet 100 mg: 100 mg | ORAL | @ 13:00:00

## 2020-12-25 MED ADMIN — magnesium sulfate 2gm/50mL IVPB: 2 g | INTRAVENOUS | @ 19:00:00 | Stop: 2020-12-25

## 2020-12-25 MED ADMIN — enoxaparin (LOVENOX) syringe 30 mg: 30 mg | SUBCUTANEOUS | @ 13:00:00

## 2020-12-25 MED ADMIN — melatonin tablet 3 mg: 3 mg | ORAL | @ 02:00:00

## 2020-12-25 MED ADMIN — carboxymethylcellulose sodium (THERATEARS) 0.25 % ophthalmic solution 2 drop: 2 [drp] | OPHTHALMIC | @ 19:00:00

## 2020-12-25 MED ADMIN — folic acid (FOLVITE) tablet 500 mcg: 500 ug | ORAL | @ 13:00:00

## 2020-12-25 MED ADMIN — carboxymethylcellulose sodium (THERATEARS) 0.25 % ophthalmic solution 2 drop: 2 [drp] | OPHTHALMIC | @ 10:00:00

## 2020-12-25 MED ADMIN — psyllium (METAMUCIL) 3.4 gram packet 1 packet: 1 | ORAL | @ 13:00:00 | Stop: 2020-12-25

## 2020-12-25 NOTE — Unmapped (Signed)
Patient A&Ox4 but forgetful. Has been anxious asking not to be left alone at times. Diaphoretic and warm this shift though no fever. LUE swollen and bruised appearing. PIV in LUE intact, good blood return. Hospitalist notified, PVL ordered for LUE. C/o pain in hands and back. Minimal dinner intake, drank ensure though as patient stated she was thirsty. Fluids running per order. Crusting provided to sacral wounds. Q2 turns maintained. Vey small loose BM this shift. VSS. Safety precautions maintained. WCTM      Problem: Skin Injury Risk Increased  Goal: Skin Health and Integrity  Outcome: Not Progressing  Intervention: Optimize Skin Protection  Recent Flowsheet Documentation  Taken 12/24/2020 1946 by Elias Else, RN  Pressure Reduction Techniques:   weight shift assistance provided   heels elevated off bed  Head of Bed (HOB) Positioning: HOB at 20-30 degrees  Pressure Reduction Devices: pressure-redistributing mattress utilized  Skin Protection:   adhesive use limited   cleansing with dimethicone incontinence wipes   incontinence pads utilized     Problem: Adult Inpatient Plan of Care  Goal: Plan of Care Review  Outcome: Not Progressing  Goal: Patient-Specific Goal (Individualized)  Outcome: Not Progressing  Goal: Absence of Hospital-Acquired Illness or Injury  Outcome: Not Progressing  Intervention: Identify and Manage Fall Risk  Recent Flowsheet Documentation  Taken 12/24/2020 1946 by Elias Else, RN  Safety Interventions:   aspiration precautions   environmental modification   fall reduction program maintained   lighting adjusted for tasks/safety   low bed   no IV/BP/blood draw left arm   nonskid shoes/slippers when out of bed  Intervention: Prevent Skin Injury  Recent Flowsheet Documentation  Taken 12/24/2020 1946 by Elias Else, RN  Skin Protection:   adhesive use limited   cleansing with dimethicone incontinence wipes   incontinence pads utilized  Intervention: Prevent and Manage VTE (Venous Thromboembolism) Risk  Recent Flowsheet Documentation  Taken 12/24/2020 1946 by Elias Else, RN  Activity Management: activity adjusted per tolerance  VTE Prevention/Management:   intravenous hydration   fluids promoted  Goal: Optimal Comfort and Wellbeing  Outcome: Not Progressing  Goal: Readiness for Transition of Care  Outcome: Not Progressing  Goal: Rounds/Family Conference  Outcome: Not Progressing     Problem: Self-Care Deficit  Goal: Improved Ability to Complete Activities of Daily Living  Outcome: Not Progressing     Problem: Impaired Wound Healing  Goal: Optimal Wound Healing  Outcome: Not Progressing  Intervention: Promote Wound Healing  Recent Flowsheet Documentation  Taken 12/24/2020 1946 by Elias Else, RN  Activity Management: activity adjusted per tolerance     Problem: Fall Injury Risk  Goal: Absence of Fall and Fall-Related Injury  Outcome: Not Progressing  Intervention: Promote Injury-Free Environment  Recent Flowsheet Documentation  Taken 12/24/2020 1946 by Elias Else, RN  Safety Interventions:   aspiration precautions   environmental modification   fall reduction program maintained   lighting adjusted for tasks/safety   low bed   no IV/BP/blood draw left arm   nonskid shoes/slippers when out of bed

## 2020-12-25 NOTE — Unmapped (Signed)
VENOUS ACCESS TEAM PROCEDURE    Order was placed for a PIV by Venous Access Team (VAT).  Patient was assessed at bedside for placement of a PIV. PPE were donned per protocol.  Access was obtained. Blood return noted.  Dressing intact and device well secured.  Flushed with normal saline.  See LDA for details.  Pt advised to inform RN of any s/s of discomfort at the PIV site.    Workup / Procedure Time:  15 minutes        RN was notified.       Thank you,     Melodye Ped RN Venous Access Team

## 2020-12-25 NOTE — Unmapped (Addendum)
Hospital Medicine Daily Progress Note    Assessment/Plan:    Principal Problem:    Weakness  Active Problems:    Abnormality of gait    Hypertension, benign    Irritable bowel syndrome with both constipation and diarrhea  Resolved Problems:    * No resolved hospital problems. *        Wound 12/19/20 Coccyx (Active)   Wound Image   12/20/20 0800   Dressing Status      No dressing 12/24/20 1946   Wound Length (cm) 8 cm 12/20/20 0800   Wound Width (cm) 7 cm 12/20/20 0800   Wound Surface Area (cm^2) 56 cm^2 12/20/20 0800   Wound Bed Red;Pink 12/24/20 1946   Odor None 12/24/20 1946   Peri-wound Assessment      Clean;Dry;Intact 12/24/20 1946   Exudate Amnt      None 12/24/20 1946   Undermining     No 12/24/20 1946   Treatments Cleansed/Irrigation;Zinc based products;Other (Comment) 12/24/20 1946   Dressing Open to air 12/24/20 1946           Cindy [name] is a 80 y.o. female that presented to [facility] with Weakness.    Delirium - Weakness: Possibly in the setting of genitourinary infection, dehydration, and poor PO intake. Initially concerning for stroke given report of facial weakness at facility. ??CTH upon arrival and evaluation by neurology shows no evidence for stroke. However she is severely debilitated, and according to her daughter this is??worsening. ??No altered mental status at time of admission. CXR did show opacity which may represent atelectasis, aspiration, or infection. Low suspicion for respiratory infection as patient is on room air, and currently without respiratory complaints. No leukocytosis. TSH normal. There was concern for Parkinsonism??given her very soft voice and what appeared to be may be some cogwheeling on initial exam. Evaluated by SLP, no concern for aspiration. Per SLP, decreased voice intensity likely secondary to strength and endurance deficits. Recommended soft and bite sized diet (on pureed at ALF) and thin liquids. Patient's mentation gradually improving, and appears to be nearing baseline.   - Will discontinue IVF as patient is approaching readiness for discharge soon to SNF  - Strict Is/Os and calorie count to evaluate intake  - PT/OT recommending 5x low - and CM has arrange for long-term bed at SNF  - Holding home clonazepam - and will consider restarting as outpatient.   ??  E. Coli UTI: UA negative for pyuria, though urine culture showing 50,000 to 100,000 CFU/mL Escherichia coli.  Though possibly colonization, in the setting of urinary retention requiring catheterization, worsening malaise, fatigue, and concerns for delirium upon admission, will treat empirically for E. Coli UTI. S/p 5 day course of antibiotics - completed on 5/9.   - Continue foley catheter for decompression for urinary retention likely related to stool ball/constipation.??TOV around 5/11 (1 week after need for replacement) or sooner pending clinical progress   ??  Constipation - Rectal stool ball: CT abd/pelv obtained in ED showed multiple dilated loops of air and fluid-filled small bowel concerning for mechanical obstruction. Large rectal stool ball with associated rectal wall thickening. Manual disimpaction performed in ED. Given SMOG enema x 1. KUB redemonstrated very large rectal stool ball with upstream dilatation of large and small bowel diffusely, similar to previous day's CT exam. Low suspicion for bowel obstruction with ongoing stooling. Has had issues previously with incomplete rectal evacuation. Attempted bedside disimpaction 5/4 which was unsuccessful. Given additional SMOG enema 5/5 with reported, small  firm, stool. Patient is having loose stool with additional enemas administered. KUB on 5/8 does not reveal ongoing stool ball, however patient with moderate to severe constipation which appears to more in ascending colon. This is despite daily metamucil use in the hospital. I performed digital rectal exam today in attempts to clear impaction if present, however no stool in the rectal vault.   - Thus, will avoid further enemas at this time and start on more aggressive regimen from above with Miralax BID. Will discontinue metamucil.   - Start senna BID  - Will recheck KUB tomorrow, and if persistent large amounts of stool burden above then will consider trial of Mag Citrate or Golytely    Left arm swelling: Patient noted to have significant swelling overnight, and patient reports it has been present for about 1 week. Upon discussion with patient, it appears this occurred previously. Query if dependent as the patient primarily laying in bed to the left.   - Follow up LUE PVL  - Elevated LUE  ??  Stage II sacral decubitus wound present on admission:  - Continue local wound care  - Appreciate WOCN consult and recommendations - following weekly.  ??  Hemorrhoids: No bleeding on exam.   - Continue Hydrocortisone suppository BID  ??  Normocytic anemia: Per history, hemoglobin 10.8 at time of admission, previously 11.1 in 10/2020. No overt signs of bleeding. Possible hemodilution with IVF hydration. Iron panel reviewed, and with normal ferritin and TIBC 195, likely anemia of chronic disease. Vitamin B12 and folate serum WNL. H/H is stable on repeat CBC today.    ??  Hypertension: Given elevated BUN and risk for acute kidney injury, home ARB held upon admission. Overall BP is near goal for age.   - Continue Amlodipine 5 mg daily   ??  Schizoaffective disorder: ??In chart review, patient was transitioning off paliperidone.????Has been started on low-dose clonazepam by PCP, not psychiatrist.   - Continue to hold home clonazepam, and consider restarting as outpatient. No signs of benzodiazapine withdrawal during hospital stay.   ??   Degenerative joint disease with chronic pain: Patient is on chronic gabapentin as home, however this was held upon admission due to mental status. Patient's mental status has improved, however declining any current pain.   - Will continue to hold gabapentin, however would consider restarting slowly beginning with 100 mg at bedtime if needed.    ??  DVT prophylaxis:??Enoxaparin  ??  Disposition:????Previously at ALF, however family was previously pursuing SNF placement prior to admission in the setting of disconditioning. PT/OT recommending 5x low, weekly. CM has arranged for placement to long term care bed for SNF.   ??  Code Status:??Full Code. Dr. Leroy Kennedy discussed with patient's daughter on 5/8 - and confirmed full code, however reported she would like to discuss further with her mother on possibly changing this.      Floor time 35 minutes, > 50% spent in counseling and coordination of care about the following issues on 12/25/20:  constipation, bowel regimen, decreased electrolytes requiring repletion, possible discharge to SNF soon, discussions with patient, patient's daughter, CM, RN.  ___________________________________________________________________    Subjective:  Patient seen and examined at bedside in the AM. No acute events overnight, however noted overnight to have left arm swelling. Patient reports she is feeling ok. Reports she has had left arm swelling for about 1 week, and states it was present prior to admission. Patient reports she has intermittently had this before due to her positioning. Patient  reports she noted some SOB this AM with cough. Denies fever, chills, nausea, vomiting. Reports vague abdominal pain. Discussed with RN, and patient with 2 loose BMs yesterday after receiving IV tap water enema.     Labs/Studies:  Labs and Studies from the last 24hrs per EMR and Reviewed  Labs significant for: WBC 6.7, H/H 9.3/27, platelets 191, Na 137, K 2.7, BUN/Cr 8/0.33, Ca 8, Mg 1.3.    KUB: Moderate-large colonic stool burden however overall, decreased compared to prior. No obstructing bowel pattern.    Objective:  Temp:  [36.8 ??C (98.2 ??F)-37 ??C (98.6 ??F)] 36.8 ??C (98.2 ??F)  Heart Rate:  [71-105] 80  Resp:  [14-20] 18  BP: (129-168)/(82-96) 140/84  SpO2:  [94 %-96 %] 96 %    GEN: NAD, lying in bed, flat affect, soft spoke, oriented to person/birthday/[facility]/May 2022/president  CV: RRR, nl S1/S2  PULM: Decreased BS anteriorly, no increased WOB  ABD: soft, NT/ND, +BS, rectal exam without any solid stool in vault upon attempt of disimpaction - liquid stool excreted following removal of digit  EXT: Left arm edema with ecchymoses

## 2020-12-26 LAB — BASIC METABOLIC PANEL
ANION GAP: 7 mmol/L (ref 5–14)
BLOOD UREA NITROGEN: 8 mg/dL — ABNORMAL LOW (ref 9–23)
BUN / CREAT RATIO: 23
CALCIUM: 7.9 mg/dL — ABNORMAL LOW (ref 8.7–10.4)
CHLORIDE: 107 mmol/L (ref 98–107)
CO2: 23 mmol/L (ref 20.0–31.0)
CREATININE: 0.35 mg/dL — ABNORMAL LOW
EGFR CKD-EPI AA FEMALE: >90 mL/min/{1.73_m2} (ref >=60)
EGFR CKD-EPI NON-AA FEMALE: >90 mL/min/{1.73_m2} (ref >=60)
GLUCOSE RANDOM: 87 mg/dL (ref 70–179)
POTASSIUM: 3.4 mmol/L (ref 3.4–4.8)
SODIUM: 137 mmol/L (ref 135–145)

## 2020-12-26 LAB — MAGNESIUM: MAGNESIUM: 1.8 mg/dL (ref 1.6–2.6)

## 2020-12-26 MED ADMIN — magnesium oxide (MAG-OX) tablet 400 mg: 400 mg | ORAL | @ 14:00:00

## 2020-12-26 MED ADMIN — diclofenac sodium (VOLTAREN) 1 % gel 4 g: 4 g | TOPICAL | @ 09:00:00

## 2020-12-26 MED ADMIN — amLODIPine (NORVASC) tablet 5 mg: 5 mg | ORAL | @ 14:00:00

## 2020-12-26 MED ADMIN — diclofenac sodium (VOLTAREN) 1 % gel 4 g: 4 g | TOPICAL | @ 01:00:00

## 2020-12-26 MED ADMIN — melatonin tablet 3 mg: 3 mg | ORAL | @ 01:00:00

## 2020-12-26 MED ADMIN — potassium chloride (KLOR-CON) packet 40 mEq: 40 meq | ORAL | @ 04:00:00 | Stop: 2020-12-26

## 2020-12-26 MED ADMIN — acetaminophen (TYLENOL) tablet 650 mg: 650 mg | ORAL | @ 04:00:00

## 2020-12-26 MED ADMIN — carboxymethylcellulose sodium (THERATEARS) 0.25 % ophthalmic solution 2 drop: 2 [drp] | OPHTHALMIC | @ 14:00:00

## 2020-12-26 MED ADMIN — acetaminophen (TYLENOL) tablet 500 mg: 500 mg | ORAL | @ 19:00:00

## 2020-12-26 MED ADMIN — potassium chloride (KLOR-CON) packet 40 mEq: 40 meq | ORAL | @ 01:00:00 | Stop: 2020-12-25

## 2020-12-26 MED ADMIN — hydrocortisone (ANUSOL-HC) suppository 25 mg: 25 mg | RECTAL | @ 01:00:00

## 2020-12-26 MED ADMIN — potassium chloride (KLOR-CON) packet 40 mEq: 40 meq | ORAL | @ 07:00:00 | Stop: 2020-12-26

## 2020-12-26 MED ADMIN — docusate sodium (COLACE) capsule 200 mg: 200 mg | ORAL | @ 01:00:00

## 2020-12-26 MED ADMIN — carboxymethylcellulose sodium (THERATEARS) 0.25 % ophthalmic solution 2 drop: 2 [drp] | OPHTHALMIC | @ 22:00:00

## 2020-12-26 MED ADMIN — senna (SENOKOT) tablet 2 tablet: 2 | ORAL | @ 01:00:00

## 2020-12-26 MED ADMIN — acetaminophen (TYLENOL) tablet 500 mg: 500 mg | ORAL | @ 14:00:00

## 2020-12-26 MED ADMIN — docusate sodium (COLACE) capsule 200 mg: 200 mg | ORAL | @ 14:00:00

## 2020-12-26 MED ADMIN — enoxaparin (LOVENOX) syringe 30 mg: 30 mg | SUBCUTANEOUS | @ 14:00:00

## 2020-12-26 MED ADMIN — carboxymethylcellulose sodium (THERATEARS) 0.25 % ophthalmic solution 2 drop: 2 [drp] | OPHTHALMIC | @ 19:00:00

## 2020-12-26 MED ADMIN — diclofenac sodium (VOLTAREN) 1 % gel 4 g: 4 g | TOPICAL | @ 16:00:00

## 2020-12-26 MED ADMIN — carboxymethylcellulose sodium (THERATEARS) 0.25 % ophthalmic solution 2 drop: 2 [drp] | OPHTHALMIC | @ 07:00:00

## 2020-12-26 MED ADMIN — senna (SENOKOT) tablet 2 tablet: 2 | ORAL | @ 14:00:00

## 2020-12-26 MED ADMIN — acetaminophen (TYLENOL) tablet 650 mg: 650 mg | ORAL | @ 16:00:00

## 2020-12-26 MED ADMIN — potassium chloride (KLOR-CON) CR tablet 20 mEq: 20 meq | ORAL | @ 14:00:00

## 2020-12-26 MED ADMIN — sertraline (ZOLOFT) tablet 100 mg: 100 mg | ORAL | @ 14:00:00

## 2020-12-26 MED ADMIN — hydrocortisone (ANUSOL-HC) suppository 25 mg: 25 mg | RECTAL | @ 14:00:00

## 2020-12-26 MED ADMIN — polyethylene glycol (MIRALAX) packet 17 g: 17 g | ORAL | @ 14:00:00

## 2020-12-26 MED ADMIN — carboxymethylcellulose sodium (THERATEARS) 0.25 % ophthalmic solution 2 drop: 2 [drp] | OPHTHALMIC | @ 01:00:00

## 2020-12-26 MED ADMIN — polyethylene glycol (MIRALAX) packet 17 g: 17 g | ORAL | @ 01:00:00 | Stop: 2020-12-25

## 2020-12-26 MED ADMIN — acetaminophen (TYLENOL) tablet 500 mg: 500 mg | ORAL | @ 01:00:00

## 2020-12-26 MED ADMIN — diclofenac sodium (VOLTAREN) 1 % gel 4 g: 4 g | TOPICAL | @ 20:00:00

## 2020-12-26 MED ADMIN — folic acid (FOLVITE) tablet 500 mcg: 500 ug | ORAL | @ 14:00:00

## 2020-12-26 MED ADMIN — carboxymethylcellulose sodium (THERATEARS) 0.25 % ophthalmic solution 2 drop: 2 [drp] | OPHTHALMIC | @ 09:00:00

## 2020-12-26 MED ADMIN — cholecalciferol (vitamin D3 25 mcg (1,000 units)) tablet 50 mcg: 50 ug | ORAL | @ 14:00:00

## 2020-12-26 NOTE — Unmapped (Signed)
Problem: Skin Injury Risk Increased  Goal: Skin Health and Integrity  Outcome: Progressing     Problem: Adult Inpatient Plan of Care  Goal: Plan of Care Review  Outcome: Progressing  Goal: Patient-Specific Goal (Individualized)  Outcome: Progressing  Goal: Absence of Hospital-Acquired Illness or Injury  Outcome: Progressing  Intervention: Identify and Manage Fall Risk  Recent Flowsheet Documentation  Taken 12/25/2020 1933 by Alleen Borne, RN  Safety Interventions:   fall reduction program maintained   bed alarm  Goal: Optimal Comfort and Wellbeing  Outcome: Progressing  Goal: Readiness for Transition of Care  Outcome: Progressing  Goal: Rounds/Family Conference  Outcome: Progressing     Problem: Self-Care Deficit  Goal: Improved Ability to Complete Activities of Daily Living  Outcome: Progressing     Problem: Impaired Wound Healing  Goal: Optimal Wound Healing  Outcome: Progressing     Problem: Fall Injury Risk  Goal: Absence of Fall and Fall-Related Injury  Outcome: Progressing  Intervention: Promote Injury-Free Environment  Recent Flowsheet Documentation  Taken 12/25/2020 1933 by Alleen Borne, RN  Safety Interventions:   fall reduction program maintained   bed alarm

## 2020-12-26 NOTE — Unmapped (Signed)
Problem: Skin Injury Risk Increased  Goal: Skin Health and Integrity  Outcome: Ongoing - Unchanged  Intervention: Optimize Skin Protection  Recent Flowsheet Documentation  Taken 12/25/2020 1000 by Rolly Salter, RN  Pressure Reduction Techniques: frequent weight shift encouraged     Problem: Adult Inpatient Plan of Care  Goal: Plan of Care Review  Outcome: Ongoing - Unchanged  Goal: Patient-Specific Goal (Individualized)  Outcome: Ongoing - Unchanged  Goal: Absence of Hospital-Acquired Illness or Injury  Outcome: Ongoing - Unchanged  Intervention: Identify and Manage Fall Risk  Recent Flowsheet Documentation  Taken 12/25/2020 0800 by Rolly Salter, RN  Safety Interventions:   fall reduction program maintained   low bed  Goal: Optimal Comfort and Wellbeing  Outcome: Ongoing - Unchanged  Goal: Readiness for Transition of Care  Outcome: Ongoing - Unchanged  Goal: Rounds/Family Conference  Outcome: Ongoing - Unchanged     Problem: Self-Care Deficit  Goal: Improved Ability to Complete Activities of Daily Living  Outcome: Ongoing - Unchanged     Problem: Impaired Wound Healing  Goal: Optimal Wound Healing  Outcome: Ongoing - Unchanged     Problem: Fall Injury Risk  Goal: Absence of Fall and Fall-Related Injury  Outcome: Ongoing - Unchanged  Intervention: Promote Injury-Free Environment  Recent Flowsheet Documentation  Taken 12/25/2020 0800 by Rolly Salter, RN  Safety Interventions:   fall reduction program maintained   low bed

## 2020-12-26 NOTE — Unmapped (Signed)
Adult Nutrition Progress Note    Visit Type: Follow-Up  Reason for Visit: PO Intake      Nutrition Progress:  Patient reports she has been eating poorly due to being on a puree diet. Does drink ensure clear, willing to try ensure plus.      Anthropometric Data:  Height: 152.4 cm (5')   Admission weight: 49.2 kg (108 lb 7.5 oz)  Last recorded weight: 49.2 kg (108 lb 7.5 oz)  IBW: 45.45 kg  Percent IBW:    BMI: Body mass index is 21.18 kg/m??.   Usual Body Weight: 130 lbs 3 months ago    Weight history prior to admission: Recent weight gain per documentation.   Wt Readings from Last 10 Encounters:   12/19/20 49.2 kg (108 lb 7.5 oz)   11/14/20 45.4 kg (100 lb)   10/18/20 45.5 kg (100 lb 3.2 oz)   10/11/20 45.5 kg (100 lb 3.2 oz)   09/25/20 50.8 kg (112 lb)   09/06/20 51.7 kg (114 lb)   08/24/20 51.7 kg (114 lb)   08/08/20 49.9 kg (110 lb)   08/04/20 49.9 kg (110 lb)   07/27/20 49.9 kg (110 lb)        Weight changes this admission:   Last 5 Recorded Weights    12/19/20 1225   Weight: 49.2 kg (108 lb 7.5 oz)        Nutrition Focused Physical Exam:  Nutrition Focused Physical Exam:  Fat Areas Examined  Orbital: Mild loss  Upper Arm: Moderate loss      Muscle Areas Examined  Temple: Mild loss  Clavicle: Mild loss  Acromion: Moderate loss  Scapular: Mild loss  Dorsal Hand: Moderate loss  Patellar: Moderate loss  Anterior Thigh: Moderate loss  Posterior Calf: Moderate loss              Nutrition Evaluation  Overall Impressions: Moderate fat loss;Moderate muscle loss (12/26/20 1132)  Nutrition Designation: Normal weight (BMI 18.50 - 24.99 kg/m2) (12/26/20 1132)      NUTRITIONALLY RELEVANT DATA     Medications:   Nutritionally pertinent medications reviewed and evaluated for potential food and/or medication interactions and include polyethylene glycol and senna, PRN electrolyte repletion protocols  and cholecalciferol and folic acid     Labs:   Nutritionally pertinent labs reviewed and include K+: 3.2 mmol/L and Magnesium: 1.3 mg/dL    Nutrition History:   Dec 20, 2020: Prior to admission: Unable to obtain at this time. Patient was only able to answer very simple questions. She was very soft-spoken.     Allergies, Intolerances, Sensitivities, and/or Cultural/Religious Dietary Restrictions: none identified at this time     Current Nutrition:  Oral intake        Nutrition Orders   (From admission, onward)             Start     Ordered    12/22/20 0852  Nutrition Therapy International Dysphagia; Thin Liquid, Level 0; Soft & Bite Sized, Level 6  Effective now        Question Answer Comment   Nutrition Therapy: International Dysphagia    Dysphagia Liquid Level: Thin Liquid, Level 0    Dysphagia Food Level: Soft & Bite Sized, Level 6        12/22/20 0851    12/21/20 0900  Supplement Adult; Ensure Clear (Clear Liquid); # of Products PER Serving: 1 3xd PC  3 times daily after meals      Question Answer Comment  Supplement (INP): Adult    Select Supplement Ensure Clear (Clear Liquid)    # of Products PER Serving 1        12/20/20 2120                   Nutritional Needs:   Healthy balance of carbohydrate, protein, and fat.       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 Loss: > or equal to 10% x 6 month  Fat Loss: Moderate  Muscle Loss: Moderate  Malnutrition Score: 4      GOALS and EVALUATION     ??? Patient to remain NPO and/or on a clear liquid diet less than 7 days before diet advancement. - New    Motivation, Barriers, and Compliance:  Evaluation of motivation, barriers, and compliance completed. No concerns identified at this time.     NUTRITION ASSESSMENT     Patient currently not meeting estimated nutrition needs due to poor po intake.   Has not been on puree diet x3 days, however poor intake persists. Appropriate to change clear ONS to regular [facility] ONS.     Discharge Planning:   Monitor via CAPP rounds for any discharge planning needs.      NUTRITION INTERVENTIONS and RECOMMENDATION     1. Encourage po intake    2. Recommend ensure plus chocolate tid   3. Please weigh weekly    Follow-Up Parameters:   1-2 times per week (and more frequent as indicated)    Jenny Reichmann, RD, LDN  Pager: [phone]  Phone: [phone]

## 2020-12-26 NOTE — Unmapped (Signed)
Hospital Medicine Daily Progress Note    Assessment/Plan:    Principal Problem:    Weakness  Active Problems:    Abnormality of gait    Hypertension, benign    Irritable bowel syndrome with both constipation and diarrhea  Resolved Problems:    * No resolved hospital problems. *        Wound 12/19/20 Coccyx (Active)   Wound Image   12/20/20 0800   Dressing Status      No dressing 12/24/20 1946   Wound Length (cm) 8 cm 12/20/20 0800   Wound Width (cm) 7 cm 12/20/20 0800   Wound Surface Area (cm^2) 56 cm^2 12/20/20 0800   Wound Bed Red;Pink 12/24/20 1946   Odor None 12/24/20 1946   Peri-wound Assessment      Clean;Dry;Intact 12/24/20 1946   Exudate Amnt      None 12/24/20 1946   Undermining     No 12/24/20 1946   Treatments Cleansed/Irrigation;Zinc based products;Other (Comment) 12/24/20 1946   Dressing Open to air 12/24/20 1946           Cindy [name] is a 80 y.o. female that presented to [facility] with Weakness.    Delirium - Weakness: Possibly in the setting of genitourinary infection, dehydration, and poor PO intake. Initially concerning for stroke given report of facial weakness at facility. ??CTH upon arrival and evaluation by neurology shows no evidence for stroke. However she is severely debilitated, and according to her daughter this is??worsening. ??No altered mental status at time of admission. CXR did show opacity which may represent atelectasis, aspiration, or infection. Low suspicion for respiratory infection as patient is on room air, and currently without respiratory complaints. No leukocytosis. TSH normal. There was concern for Parkinsonism??given her very soft voice and what appeared to be may be some cogwheeling on initial exam. Evaluated by SLP, no concern for aspiration. Per SLP, decreased voice intensity likely secondary to strength and endurance deficits. Recommended soft and bite sized diet (on pureed at ALF) and thin liquids. Patient's mentation gradually improving, and appears to be back to baseline. Off IVF as on 5/9.  - Strict Is/Os and calorie count to evaluate intake  - PT/OT recommending 5x low - and CM has arrange for long-term bed at SNF  - Holding home clonazepam - and will consider restarting as outpatient.   ??  E. Coli UTI: UA negative for pyuria, though urine culture showing 50,000 to 100,000 CFU/mL Escherichia coli.  Though possibly colonization, in the setting of urinary retention requiring catheterization, worsening malaise, fatigue, and concerns for delirium upon admission, will treat empirically for E. Coli UTI. S/p 5 day course of antibiotics - completed on 5/9.   - Will perform TOV starting today given no further stool ball seen and plan for likely discharge tomorrow afternoon   ??  Constipation - Rectal stool ball: CT abd/pelv obtained in ED showed multiple dilated loops of air and fluid-filled small bowel concerning for mechanical obstruction. Large rectal stool ball with associated rectal wall thickening. Manual disimpaction performed in ED. Given SMOG enema x 1. KUB redemonstrated very large rectal stool ball with upstream dilatation of large and small bowel diffusely, similar to previous day's CT exam. Low suspicion for bowel obstruction with ongoing stooling. Has had issues previously with incomplete rectal evacuation. Attempted bedside disimpaction 5/4 which was unsuccessful. Given additional SMOG enema 5/5 with reported, small firm, stool. Patient is having loose stool with additional enemas administered. KUB on 5/8 does not reveal ongoing  stool ball, however patient with moderate to severe constipation which appears to more in ascending colon. This is despite daily metamucil use in the hospital. I performed digital rectal exam on 5/9 in attempts to clear impaction if present, however no stool in the rectal vault. Repeat KUB from today commented on dilated bowel similar to prior concerning for ileus however exam is not consistent with ileus. Patient remains with moderate to severe stool burden.    - Continue more aggressive regimen with Miralax and senna BID.   - Will hold off additional imaging or medications as patient is now passing some stool rather than just liquid at this time.    Left arm swelling: Patient noted to have significant swelling on 5/8 overnight, and patient reports it has been present for about 1 week. Upon discussion with patient, it appears this occurred previously. Suspect likely dependent as the patient primarily laying in bed to the left. PVL negative. Improved on examination today.   - Elevate LUE  ??  Stage II sacral decubitus wound present on admission:  - Continue local wound care  - Appreciate WOCN consult and recommendations - following weekly.  ??  Hemorrhoids: No bleeding on exam.   - Continue Hydrocortisone suppository BID  ??  Normocytic anemia: Per history, hemoglobin 10.8 at time of admission, previously 11.1 in 10/2020. No overt signs of bleeding. Possible hemodilution with IVF hydration. Iron panel reviewed, and with normal ferritin and TIBC 195, likely anemia of chronic disease. Vitamin B12 and folate serum WNL. H/H is stable on repeat CBC today.    ??  Hypertension: Given elevated BUN and risk for acute kidney injury, home ARB held upon admission. Overall BP is near goal for age.   - Continue Amlodipine 5 mg daily   ??  Schizoaffective disorder: ??In chart review, patient was transitioning off paliperidone.????Has been started on low-dose clonazepam by PCP, not psychiatrist.   - Continue to hold home clonazepam, and consider restarting as outpatient. No signs of benzodiazapine withdrawal during hospital stay.   ??   Degenerative joint disease with chronic pain: Patient is on chronic gabapentin as home, however this was held upon admission due to mental status. Patient's mental status has improved, however declining any current pain.   - Will restart gabapentin with 100 mg qhs  ??  DVT prophylaxis:??Enoxaparin  ??  Disposition:????Previously at ALF, however family was previously pursuing SNF placement prior to admission in the setting of disconditioning. PT/OT recommending 5x low, weekly. CM has arranged for placement to long term care bed for SNF - anticipate discharge tomorrow.   ??  Code Status:??Full Code. Dr. Leroy Kennedy discussed with patient's daughter on 5/8 - and confirmed full code, however reported she would like to discuss further with her mother on possibly changing this.      Updated the patient's daughter on 5/9. Attempted to call her today with no answer, and VM left on secure line.   ___________________________________________________________________    Subjective:  Patient seen and examined at bedside in the late AM. No acute events overnight. Discussed with bedside RN, and reports the patient had 3 BMs overnight with stool present. Patient reports she continues to have abdominal pain in her RLQ which she reports is chronic and present a long time. Denies any acute change at this time. Patient is tolerating PO intake without any nausea or vomiting. If anything, patient reports she did not think she got enough for breakfast. Patient reports her breath is shallow, but denies SOB. Denies  fever, chills, nausea, vomiting. Reports vague abdominal pain.     Labs/Studies:  Labs and Studies from the last 24hrs per EMR and Reviewed  Labs significant for: Na 137, K 3.4, BUN/Cr 8/0.35, Ca 7.9, Mg 1.8.    KUB: Dilated small and large bowel, as above, probably representing ileus, similar to prior. Moderate to large stool burden, consistent with stated history of constipation, slightly increased compared with prior.    Objective:  Temp:  [36.6 ??C (97.9 ??F)] 36.6 ??C (97.9 ??F)  Heart Rate:  [80-89] 89  Resp:  [18] 18  BP: (149-152)/(82-87) 152/87  SpO2:  [95 %-96 %] 96 %    GEN: NAD, lying in bed, flat affect, soft spoke, oriented  CV: RRR, nl S1/S2  PULM: Decreased BS anteriorly, no increased WOB  ABD: soft, NT/ND, +BS, +RLQ hernia which is chronic and no significant pain upon palpation  EXT: Improved left arm edema

## 2020-12-26 NOTE — Unmapped (Signed)
Problem: Skin Injury Risk Increased  Goal: Skin Health and Integrity  Outcome: Progressing     Problem: Adult Inpatient Plan of Care  Goal: Plan of Care Review  Outcome: Progressing  Goal: Patient-Specific Goal (Individualized)  Outcome: Progressing  Goal: Absence of Hospital-Acquired Illness or Injury  Outcome: Progressing  Goal: Optimal Comfort and Wellbeing  Outcome: Progressing  Goal: Readiness for Transition of Care  Outcome: Progressing  Goal: Rounds/Family Conference  Outcome: Progressing     Problem: Self-Care Deficit  Goal: Improved Ability to Complete Activities of Daily Living  Outcome: Progressing     Problem: Impaired Wound Healing  Goal: Optimal Wound Healing  Outcome: Progressing     Problem: Fall Injury Risk  Goal: Absence of Fall and Fall-Related Injury  Outcome: Progressing

## 2020-12-27 LAB — BASIC METABOLIC PANEL
ANION GAP: 6 mmol/L (ref 5–14)
BLOOD UREA NITROGEN: 11 mg/dL (ref 9–23)
BUN / CREAT RATIO: 31
CALCIUM: 8.1 mg/dL — ABNORMAL LOW (ref 8.7–10.4)
CHLORIDE: 108 mmol/L — ABNORMAL HIGH (ref 98–107)
CO2: 24 mmol/L (ref 20.0–31.0)
CREATININE: 0.36 mg/dL — ABNORMAL LOW
EGFR CKD-EPI AA FEMALE: >90 mL/min/{1.73_m2} (ref >=60)
EGFR CKD-EPI NON-AA FEMALE: >90 mL/min/{1.73_m2} (ref >=60)
GLUCOSE RANDOM: 89 mg/dL (ref 70–179)
POTASSIUM: 3.8 mmol/L (ref 3.4–4.8)
SODIUM: 138 mmol/L (ref 135–145)

## 2020-12-27 LAB — MAGNESIUM: MAGNESIUM: 1.7 mg/dL (ref 1.6–2.6)

## 2020-12-27 MED ORDER — DOCUSATE SODIUM 100 MG CAPSULE
ORAL_CAPSULE | Freq: Two times a day (BID) | ORAL | 0 refills | 30 days
Start: 2020-12-27 — End: 2021-01-26

## 2020-12-27 MED ORDER — GABAPENTIN 100 MG CAPSULE
ORAL_CAPSULE | Freq: Every evening | ORAL | 0 refills | 30 days
Start: 2020-12-27 — End: 2021-01-26

## 2020-12-27 MED ORDER — POLYETHYLENE GLYCOL 3350 17 GRAM ORAL POWDER PACKET
PACK | Freq: Every day | ORAL | 0 refills | 60 days
Start: 2020-12-27 — End: 2021-01-26

## 2020-12-27 MED ORDER — MAGNESIUM OXIDE 400 MG (241.3 MG MAGNESIUM) TABLET
ORAL_TABLET | Freq: Every day | ORAL | 0 refills | 30 days
Start: 2020-12-27 — End: 2021-01-26

## 2020-12-27 MED ORDER — POTASSIUM CHLORIDE ER 20 MEQ TABLET,EXTENDED RELEASE(PART/CRYST)
ORAL_TABLET | Freq: Every day | ORAL | 0 refills | 30 days
Start: 2020-12-27 — End: 2021-01-26

## 2020-12-27 MED ORDER — SENNOSIDES 8.6 MG TABLET
ORAL_TABLET | Freq: Two times a day (BID) | ORAL | 0 refills | 30 days
Start: 2020-12-27 — End: 2021-01-26

## 2020-12-27 MED ORDER — DICLOFENAC 1 % TOPICAL GEL
Freq: Four times a day (QID) | TOPICAL | 0 days
Start: 2020-12-27 — End: 2021-12-27

## 2020-12-27 MED ADMIN — docusate sodium (COLACE) capsule 200 mg: 200 mg | ORAL | @ 13:00:00 | Stop: 2020-12-27

## 2020-12-27 MED ADMIN — docusate sodium (COLACE) capsule 200 mg: 200 mg | ORAL | @ 01:00:00

## 2020-12-27 MED ADMIN — cholecalciferol (vitamin D3 25 mcg (1,000 units)) tablet 50 mcg: 50 ug | ORAL | @ 13:00:00 | Stop: 2020-12-27

## 2020-12-27 MED ADMIN — acetaminophen (TYLENOL) tablet 500 mg: 500 mg | ORAL | @ 13:00:00 | Stop: 2020-12-27

## 2020-12-27 MED ADMIN — carboxymethylcellulose sodium (THERATEARS) 0.25 % ophthalmic solution 2 drop: 2 [drp] | OPHTHALMIC | @ 18:00:00 | Stop: 2020-12-27

## 2020-12-27 MED ADMIN — amLODIPine (NORVASC) tablet 5 mg: 5 mg | ORAL | @ 13:00:00 | Stop: 2020-12-27

## 2020-12-27 MED ADMIN — diclofenac sodium (VOLTAREN) 1 % gel 4 g: 4 g | TOPICAL | @ 10:00:00 | Stop: 2020-12-27

## 2020-12-27 MED ADMIN — diclofenac sodium (VOLTAREN) 1 % gel 4 g: 4 g | TOPICAL | @ 01:00:00

## 2020-12-27 MED ADMIN — carboxymethylcellulose sodium (THERATEARS) 0.25 % ophthalmic solution 2 drop: 2 [drp] | OPHTHALMIC | @ 14:00:00 | Stop: 2020-12-27

## 2020-12-27 MED ADMIN — melatonin tablet 3 mg: 3 mg | ORAL | @ 01:00:00

## 2020-12-27 MED ADMIN — magnesium sulfate in D5W 1 gram/100 mL infusion 1 g: 1 g | INTRAVENOUS | @ 13:00:00 | Stop: 2020-12-27

## 2020-12-27 MED ADMIN — diclofenac sodium (VOLTAREN) 1 % gel 4 g: 4 g | TOPICAL | @ 18:00:00 | Stop: 2020-12-27

## 2020-12-27 MED ADMIN — acetaminophen (TYLENOL) tablet 500 mg: 500 mg | ORAL | @ 18:00:00 | Stop: 2020-12-27

## 2020-12-27 MED ADMIN — potassium chloride (KLOR-CON) CR tablet 20 mEq: 20 meq | ORAL | @ 13:00:00 | Stop: 2020-12-27

## 2020-12-27 MED ADMIN — senna (SENOKOT) tablet 2 tablet: 2 | ORAL | @ 13:00:00 | Stop: 2020-12-27

## 2020-12-27 MED ADMIN — gabapentin (NEURONTIN) capsule 100 mg: 100 mg | ORAL | @ 01:00:00

## 2020-12-27 MED ADMIN — polyethylene glycol (MIRALAX) packet 17 g: 17 g | ORAL | @ 01:00:00

## 2020-12-27 MED ADMIN — enoxaparin (LOVENOX) syringe 30 mg: 30 mg | SUBCUTANEOUS | @ 13:00:00 | Stop: 2020-12-27

## 2020-12-27 MED ADMIN — carboxymethylcellulose sodium (THERATEARS) 0.25 % ophthalmic solution 2 drop: 2 [drp] | OPHTHALMIC | @ 01:00:00

## 2020-12-27 MED ADMIN — polyethylene glycol (MIRALAX) packet 17 g: 17 g | ORAL | @ 13:00:00 | Stop: 2020-12-27

## 2020-12-27 MED ADMIN — hydrocortisone (ANUSOL-HC) suppository 25 mg: 25 mg | RECTAL | @ 13:00:00 | Stop: 2020-12-27

## 2020-12-27 MED ADMIN — carboxymethylcellulose sodium (THERATEARS) 0.25 % ophthalmic solution 2 drop: 2 [drp] | OPHTHALMIC | @ 10:00:00 | Stop: 2020-12-27

## 2020-12-27 MED ADMIN — magnesium oxide (MAG-OX) tablet 400 mg: 400 mg | ORAL | @ 13:00:00 | Stop: 2020-12-27

## 2020-12-27 MED ADMIN — hydrocortisone (ANUSOL-HC) suppository 25 mg: 25 mg | RECTAL | @ 04:00:00 | Stop: 2020-12-27

## 2020-12-27 MED ADMIN — folic acid (FOLVITE) tablet 500 mcg: 500 ug | ORAL | @ 13:00:00 | Stop: 2020-12-27

## 2020-12-27 MED ADMIN — sertraline (ZOLOFT) tablet 100 mg: 100 mg | ORAL | @ 13:00:00 | Stop: 2020-12-27

## 2020-12-27 MED ADMIN — acetaminophen (TYLENOL) tablet 500 mg: 500 mg | ORAL | @ 01:00:00

## 2020-12-27 MED ADMIN — senna (SENOKOT) tablet 2 tablet: 2 | ORAL | @ 01:00:00

## 2020-12-27 NOTE — Unmapped (Signed)
1206 A&Ox3, VSS. Q2 turns. Total care. Foley removed at 1900 - still awaiting void at this time. CNA assisted with dinner overnight.   Pt seen and assessed. Medications administered. Education and POC followed and updated as appropriate. Bed locked in lowest position with call bell in reach and falls precautions followed.     Problem: Skin Injury Risk Increased  Goal: Skin Health and Integrity  Outcome: Ongoing - Unchanged  Intervention: Optimize Skin Protection  Recent Flowsheet Documentation  Taken 12/26/2020 1915 by Berenice Primas, RN  Pressure Reduction Techniques: frequent weight shift encouraged  Head of Bed (HOB) Positioning:   HOB elevated   HOB at 20-30 degrees  Pressure Reduction Devices: pressure-redistributing mattress utilized  Skin Protection: adhesive use limited     Problem: Adult Inpatient Plan of Care  Goal: Plan of Care Review  Outcome: Ongoing - Unchanged  Goal: Patient-Specific Goal (Individualized)  Outcome: Progressing  Goal: Absence of Hospital-Acquired Illness or Injury  Outcome: Ongoing - Unchanged  Intervention: Identify and Manage Fall Risk  Recent Flowsheet Documentation  Taken 12/26/2020 1915 by Berenice Primas, RN  Safety Interventions:   bed alarm   bleeding precautions   environmental modification   fall reduction program maintained   infection management   lighting adjusted for tasks/safety   low bed   nonskid shoes/slippers when out of bed  Intervention: Prevent Skin Injury  Recent Flowsheet Documentation  Taken 12/26/2020 1915 by Berenice Primas, RN  Skin Protection: adhesive use limited  Intervention: Prevent and Manage VTE (Venous Thromboembolism) Risk  Recent Flowsheet Documentation  Taken 12/26/2020 1915 by Berenice Primas, RN  Activity Management: activity adjusted per tolerance  Intervention: Prevent Infection  Recent Flowsheet Documentation  Taken 12/26/2020 1915 by Berenice Primas, RN  Infection Prevention:   cohorting utilized   hand hygiene promoted   rest/sleep promoted  Goal: Optimal Comfort and Wellbeing  Outcome: Progressing  Goal: Readiness for Transition of Care  Outcome: Ongoing - Unchanged  Goal: Rounds/Family Conference  Outcome: Ongoing - Unchanged

## 2020-12-27 NOTE — Unmapped (Signed)
Problem: Skin Injury Risk Increased  Goal: Skin Health and Integrity  Outcome: Progressing     Problem: Adult Inpatient Plan of Care  Goal: Plan of Care Review  Outcome: Progressing  Goal: Patient-Specific Goal (Individualized)  Outcome: Progressing  Goal: Absence of Hospital-Acquired Illness or Injury  Outcome: Progressing  Goal: Optimal Comfort and Wellbeing  Outcome: Progressing  Goal: Readiness for Transition of Care  Outcome: Progressing  Goal: Rounds/Family Conference  Outcome: Progressing     Problem: Self-Care Deficit  Goal: Improved Ability to Complete Activities of Daily Living  Outcome: Progressing     Problem: Impaired Wound Healing  Goal: Optimal Wound Healing  Outcome: Progressing     Problem: Fall Injury Risk  Goal: Absence of Fall and Fall-Related Injury  Outcome: Progressing

## 2020-12-27 NOTE — Unmapped (Addendum)
Physician Discharge Summary [facility]  [facility] OBSERVATION [facility]  [address]  [place] [address]  Dept: [phone]  Loc: [phone]     Identifying Information:   Cindy [name]  1941-07-15  [id number]    Primary Care Physician: [facility] [facility] [facility]   Code Status: Full Code    Admit Date: 12/19/2020    Discharge Date: 12/27/2020     Discharge To: Skilled nursing facility    Discharge Service: [facility]     Discharge Attending Physician: Coralee Pesa, MD    Discharge Diagnoses:  Principal Problem:    Weakness POA: Unknown  Active Problems:    Abnormality of gait POA: Yes    Hypertension, benign POA: Yes    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 *      Outpatient Provider Follow Up Issues:   [ ]  Recommend to recheck BMP in 2-3 days given starting new K/Mg PO supplementation  [ ]  Recommend to involve palliative care to assess goals of cares and symptom management. Patient is currently a full code, however patient's daughter is interested to discuss changing goals of care to DNR/DNI and involving palliative care/hospice evaluation  [ ]  Held prior meds of clonazepam/benzdryl at bedtime since admission and upon discharge. Monitor, and restart as indicated. Also gabapentin was held, but restarted at low dose prior to discharge. Titrate gabapentin as indicated.     Hospital Course:     Cindy [name] is a 80 y.o. female that presented to [facility] with Weakness.  ??  Delirium - Weakness: Possibly in the setting of genitourinary infection, dehydration, and poor PO intake.??Initially concerning for stroke given report of facial weakness at facility. ??CTH upon arrival and evaluation by neurology shows no evidence for stroke. However she is severely debilitated, and according to her daughter this has been??worsening. ??No altered mental status at time of admission. CXR did show opacity which may represent atelectasis, aspiration, or infection. Low suspicion for respiratory infection as patient is on room air, and currently without respiratory complaints. No leukocytosis. TSH normal. There was concern for Parkinsonism??given her very soft voice and what appeared to be may be some cogwheeling on initial exam. Evaluated by SLP, no concern for aspiration. Per SLP, decreased voice intensity likely secondary to strength and endurance deficits. Recommended soft and bite sized diet (on pureed at ALF) and thin liquids. Patient's mentation gradually improving, and appears to be back to baseline. Patient was treated with IVF, however off since 5/9 with improving labs. Home gabapentin, clonazepam, and benadryl were held on admission. Patient reported some restless legs prior to discharge, and restated on lower dose gabapentin at 100 mg at bedtime with instructions to titrate as indicated and tolerated. Prior clonazepam and benadryl remain on hold.   ??  E. Coli UTI:??UA negative for pyuria, though urine culture showing 50,000 to 100,000 CFU/mL Escherichia coli. ??Though possibly colonization, in the setting of urinary retention requiring catheterization, worsening malaise, fatigue, and concerns for delirium upon admission, will treat empirically for E. Coli UTI. S/p 5 day course of antibiotics - completed on 5/9. TOV started on 5/10 PM, and patient did require straight cath x 2. Patient's daughter repots the patient will sometimes hold her urine as well even prior to admission. Thus, patient required foley to replaced prior to discharge and recommend TOV as outpatient.   ??  Constipation - Rectal stool ball: CT  abd/pelv obtained in ED showed multiple dilated loops of air and fluid-filled small bowel concerning for mechanical obstruction. Large rectal stool ball with associated rectal wall thickening. Manual disimpaction performed in ED. Given SMOG enema x 1. KUB redemonstrated very large rectal stool ball with upstream dilatation of large and small bowel diffusely, similar to previous day's CT exam. Low suspicion for bowel obstruction with ongoing stooling. Has had issues previously with incomplete rectal evacuation. Attempted bedside disimpaction 5/4 which was unsuccessful. Given additional SMOG enema 5/5 with reported, small firm, stool. Patient is having loose stool with additional enemas administered. KUB on 5/8 does not reveal ongoing stool ball, however patient with moderate to severe constipation which appears to more in ascending colon. This is despite daily metamucil use in the hospital. I performed digital rectal exam on 5/9 in attempts to clear impaction if present, however no stool in the rectal vault. Patient's bowel regimen was changed slightly to senna BID, miralax BID in addition to colace. Repeat KUB from 5/10 commented on dilated bowel similar to prior concerning for ileus however exam is not consistent with ileus. Patient remains with moderate to severe stool burden, however after that the patient had several large BMs with stool present. Patient will be discharged on bowel regimen with miralax daily, senna BID, and colace BID. Recommend to titrate bowel regimen as needed with constipation.   ??  Left arm swelling: Patient noted to have significant swelling on 5/8 overnight, and patient reports it has been present for about 1 week. Upon discussion with patient, it appears this occurred previously. Suspect likely dependent as the patient primarily laying in bed to the left. PVL negative. Improved with elevation.   ??  Stage II sacral decubitus wound present on admission: WOCN consulted and recommended local wound care.   ??  Hemorrhoids: No bleeding on exam. Treated with hydrocortisone suppositories inpatient.   ??  Normocytic anemia:??Per history, hemoglobin 10.8 at time of admission, previously 11.1 in 10/2020. No overt signs of bleeding. Possible hemodilution with IVF hydration. Iron panel reviewed, and with normal ferritin and TIBC 195, likely anemia of chronic disease. Vitamin B12 and folate serum WNL. H/H is stable on repeat.    ??  Hypertension:??Given elevated BUN and risk for acute kidney injury, home ARB held upon admission. Overall BP is near goal for age on home med of Amlodipine 5 mg daily. Thus, home benicar held upon discharge as well.    ??  Schizoaffective disorder: ??In chart review, patient was transitioning off paliperidone.????Has been started on low-dose clonazepam by PCP, not psychiatrist. Held home clonazepam, and consider restarting as outpatient if indicated and MS allows. No signs of benzodiazapine withdrawal during hospital stay.   ??   Degenerative joint disease with chronic pain: Patient is on chronic gabapentin as home, however this was held upon admission due to mental status. Patient's mental status has improved. Patient's home gabapentin was held, but restarted at 100 mg at bedtime prior to discharge. Recommend to continue to monitor, and titrate as tolerated.   ??  Code Status:??Full Code. Dr. Leroy Kennedy discussed with patient's daughter on 5/8 - and confirmed full code, however reported she would like to discuss further with her mother on possibly changing this. I discussed this further with the patient's daughter and she is interested to have team discussion with patient to address code status and goals of care. Agreeable to palliative care and possibly hospice evaluation. Patient's daughter was counseled that patient would likely benefit from DNR/DNI, and seems  to agree but wants medical team help to discuss with patient prior to changing code status.     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 Loss: > or equal to 10% x 6 month  Fat Loss: Moderate  Muscle Loss: Moderate  Malnutrition Score: 4      Procedures:  No admission procedures for hospital encounter.  ______________________________________________________________________  Discharge Medications:     Your Medication List      STOP taking these medications    BenadryL 25 mg capsule  Generic drug: diphenhydrAMINE     clonazePAM 0.25 MG disintegrating tablet  Commonly known as: KlonoPIN     olmesartan 20 MG tablet  Commonly known as: BENICAR        START taking these medications    magnesium oxide 400 mg (241.3 mg elemental) tablet  Commonly known as: MAG-OX  Take 1 tablet (400 mg total) by mouth daily.     polyethylene glycol 17 gram packet  Commonly known as: MIRALAX  Take 17 g by mouth daily.     potassium chloride 20 MEQ CR tablet  Commonly known as: KLOR-CON  Take 1 tablet (20 mEq total) by mouth daily.     senna 8.6 mg tablet  Commonly known as: SENOKOT  Take 2 tablets by mouth Two (2) times a day.        CHANGE how you take these medications    acetaminophen 325 MG tablet  Commonly known as: TYLENOL  Take 650 mg by mouth every eight (8) hours.  What changed: Another medication with the same name was removed. Continue taking this medication, and follow the directions you see here.     docusate sodium 100 MG capsule  Commonly known as: COLACE  Take 2 capsules (200 mg total) by mouth Two (2) times a day.  What changed: when to take this     gabapentin 100 MG capsule  Commonly known as: NEURONTIN  Take 1 capsule (100 mg total) by mouth nightly.  What changed:   ?? See the new instructions.  ?? Another medication with the same name was removed. Continue taking this medication, and follow the directions you see here.        CONTINUE taking these medications    aluminum-magnesium hydroxide-simethicone 200-200-20 mg/5 mL Susp  Commonly known as: MAALOX PLUS  Take 10 mL by mouth Three (3) times a day.     amLODIPine 5 MG tablet  Commonly known as: NORVASC  Take 1 tablet (5 mg total) by mouth daily.     ASHWAGANDHA ROOT EXTRACT(BULK) MISC  Take 2 tablets by mouth two (2) times a day. 800 mg per tablet. Aspercreme (lidocaine HCL) 4 % Lqro  Generic drug: lidocaine HCL  Apply 1 application topically Three (3) times a day as needed.     bisacodyL 5 mg EC tablet  Commonly known as: DULCOLAX  Take 5 mg by mouth 3 (three) times a week.     bismuth subsalicylate 524 mg/30 mL Sppk  Take 525 mg by mouth four (4) times a day as needed.     carboxymethylcellulose sodium 0.25 % Drop  Commonly known as: THERATEARS  Administer 2 drops to both eyes every two (2) hours as needed.     chlorhexidine 0.12 % solution  Commonly known as: PERIDEX  15 mL by Mouth route Two (2)  times a day. Rinse and spit out excess.     cholecalciferol (vitamin D3-50 mcg (2,000 unit)) 50 mcg (2,000 unit) tablet  Take 1 tablet (50 mcg total) by mouth daily.     diclofenac sodium 1 % gel  Commonly known as: VOLTAREN  Apply 1 g topically four (4) times a day.     erythromycin 5 mg/gram (0.5 %) ophthalmic ointment  Commonly known as: ROMYCIN  Apply a thin layer to all incisions 3 times a day and inside the lower eyelid at bedtime.     FOLTABS 800 ORAL  Take 800 mg by mouth daily with evening meal.     glycerin (adult) Supp  Insert 1 suppository into the rectum daily as needed (if bowels are hard.).     INVEGA SUSTENNA 156 mg/mL Syrg  Generic drug: paliperidone palmitate  Inject 1 mL (156 mg total) into the muscle every twenty-eight (28) days for 12 doses.     ketoconazole 2 % shampoo  Commonly known as: NIZORAL  Apply 1 application topically Two (2) times a week.     NASALCROM 5.2 mg/spray (4 %) nasal spray  Generic drug: cromolyn  1 spray into each nostril two (2) times a day.     PONARIS Soln  Generic drug: eucalyptus-peppermint oil  1 spray into each nostril two (2) times a day.     sertraline 100 MG tablet  Commonly known as: ZOLOFT  Take 1 tablet (100 mg total) by mouth daily.            Allergies:  Carbamazepine, Other, Amitiza [lubiprostone], Aspirin, Ciprofloxacin, Hctz [hydrochlorothiazide], Prednisone, Raloxifene, Shellfish containing products, Wellbutrin [bupropion hcl], Baclofen, Bismuth subsalicylate, and Escitalopram  ______________________________________________________________________  Pending Test Results (if blank, then none):      Most Recent Labs:  All lab results last 24 hours -   Recent Results (from the past 24 hour(s))   Basic Metabolic Panel    Collection Time: 12/27/20  3:47 AM   Result Value Ref Range    Sodium 138 135 - 145 mmol/L    Potassium 3.8 3.4 - 4.8 mmol/L    Chloride 108 (H) 98 - 107 mmol/L    CO2 24.0 20.0 - 31.0 mmol/L    Anion Gap 6 5 - 14 mmol/L    BUN 11 9 - 23 mg/dL    Creatinine 1.61 (L) 0.60 - 0.80 mg/dL    BUN/Creatinine Ratio 31     EGFR CKD-EPI Non-African American, Female >90 >=60 mL/min/1.80m2    EGFR CKD-EPI African American, Female >90 >=60 mL/min/1.40m2    Glucose 89 70 - 179 mg/dL    Calcium 8.1 (L) 8.7 - 10.4 mg/dL   Magnesium Level    Collection Time: 12/27/20  3:47 AM   Result Value Ref Range    Magnesium 1.7 1.6 - 2.6 mg/dL       Relevant Studies/Radiology (if blank, then none):  ECG 12 Lead    Result Date: 12/20/2020  NORMAL SINUS RHYTHM NORMAL ECG WHEN COMPARED WITH ECG OF 11-Oct-2020 16:08, QUESTIONABLE CHANGE IN QRS AXIS T WAVE INVERSION NO LONGER EVIDENT IN ANTERIOR LEADS Confirmed by Mariane Baumgarten ([id number]) on 12/20/2020 6:45:32 AM    XR Chest Portable    Result Date: 12/20/2020  EXAM: XR CHEST PORTABLE DATE: 12/20/2020 11:02 AM ACCESSION: [id number] UN DICTATED: 12/20/2020 11:34 AM [facility]PRETATION LOCATION: [facility] CLINICAL INDICATION: 80 years old Female with ALTERED MENTAL STATUS  COMPARISON: None TECHNIQUE: Portable Chest Radiograph. FINDINGS: Patchy retrocardiac airspace opacity. The right lung  is clear. No pleural effusion or pneumothorax. Normal cardiac size. Vascular calcifications. Osteopenia and advanced degenerative changes about both shoulders. Thoracolumbar scoliosis.     1. Patchy retrocardiac airspace opacity may represent atelectasis, aspiration or infection.    CT Head Wo Contrast    Result Date: 12/19/2020  EXAM: Computed tomography, head or brain without contrast material. DATE: 12/19/2020 ACCESSION: [id number] UN DICTATED: 12/19/2020 12:49 PM INTERPRETATION LOCATION: [facility] CLINICAL INDICATION: 80 years old Female with stroke  COMPARISON: CT head 10/26/2020 TECHNIQUE: Axial CT images of the head  from skull base to vertex without contrast. FINDINGS: There are scattered and confluent hypodense foci within the periventricular and deep white matter.  These are nonspecific but commonly associated with small vessel ischemic changes. Similar-appearing hypodensities in the left cerebellar hemisphere, compatible with remote infarcts. There is no midline shift. No mass lesion. There is no evidence of acute infarct. No acute intracranial hemorrhage. No fractures are evident. The sinuses are pneumatized.     No acute intracranial abnormalities.    CT Abdomen Pelvis with IV Contrast ONLY    Result Date: 12/19/2020  EXAM: CT ABDOMEN PELVIS W CONTRAST DATE: ACCESSION: [id number] UN DICTATED: 12/19/2020 3:02 PM INTERPRETATION LOCATION: [facility] CLINICAL INDICATION: abdominal pain, hernia, r/o obstruction  COMPARISON: None TECHNIQUE: Contiguous axial CT images of the abdomen and pelvis were performed following intravenous administration of contrast from the lung bases to the pubic symphysis. Sagittal and coronal reconstructions were provided for review. FINDINGS: LOWER THORAX: No pleural or pericardial effusions. Bibasilar atelectasis. LIVER: The liver is normal in size and contour. Multiple subcentimeter hepatic low-attenuation lesions, too small to characterize on CT. No intrahepatic biliary dilation. Hepatic veins and main portal vein appear patent. GALLBLADDER/BILIARY: The gallbladder is normal in appearance.  No wall thickening or pericholecystic fluid. Common bile duct is not dilated.  SPLEEN: No splenomegaly. PANCREAS: No focal masses or ductal dilation. ADRENALS: Normal size without suspicious nodules. KIDNEYS: Normal size with symmetric enhancement. Mild bilateral hydronephrosis. Unchanged right interpolar cyst. GI TRACT: Right paramedian ventral hernia containing a decompressed air and fluid-filled loop of bowel (2:90). Multiple dilated loops of air and fluid-filled small bowel, concerning for mechanical obstruction. Large rectal stool ball measuring up to 10.7 cm with associated rectal wall thickening. PERITONEUM AND MESENTERY: No free fluid, pneumoperitoneum or drainable collections. No pathologic adenopathy. RETROPERITONEUM: No pathologic adenopathy.  No masses or fluid collection. VESSELS: The aorta is normal in caliber. There is moderate atherosclerotic calcification of the aorta and branch vessels. PELVIS/BLADDER: Markedly distended urinary bladder which compresses on the right paramedian ventral hernia, as above. No pathologic pelvic or inguinal adenopathy. REPRODUCTIVE: The uterus is surgically absent. BONES AND SOFT TISSUES: There are degenerative changes of the spine. No suspicious soft tissue lesions.     Limited evaluation secondary to minimal intra-abdominal fat and markedly distended bladder and rectum. 1.Large rectal stool ball measuring approximately 10.7 cm in diameter with associated rectal wall thickening, which could represent stercoral colitis. 2.Markedly distended urinary bladder, likely secondary to outlet obstruction in the setting of large rectal stool ball as above. There is mild bilateral associated hydronephrosis. 3.Multiple dilated air and fluid-filled loops of small bowel, concerning for mechanical small bowel obstruction secondary to a loop of bowel in the ventral body wall hernia, possibly exacerbated by markedly distended bladder and rectum. Additional chronic and incidental findings, as above. The findings of this study were discussed via telephone with DR. ALEXANDRA DIGENAKIS by Dr. Thom Chimes on 12/19/2020 3:27 PM.     PVL Venous  Duplex Upper Extremity Left    Result Date: 12/26/2020   Peripheral Vascular Lab     [address]   [place], [place] [address]  PVL VENOUS DUPLEX UPPER EXTREMITY LEFT Patient Demographics Pt. Name: JENAY MORICI Location: PVL Inpatient Lab MRN:      [id number]          Sex:      F DOB:      October 11, 1940          Age:      27 years  Study Information Authorizing         [id number]        Performed Time       12/25/2020 4:01:27 Provider Name       [facility]                                   PM Ordering Physician  Ervin Knack     Patient Location     [facility] Accession Number    [id number] UN         Technologist         Juanna Cao                                                                RVT Diagnosis:                                Assisting                                           Technologist Ordered Reason For Exam: unilateral swelling Other Indication: LUE swelling x1 week Risk Factors: None Identified. Anticoagulation: (Lovenox).  Examination Protocol The internal jugular, brachiocephalic, subclavian, and axillary veins are routinely assessed bilaterally. The brachial, basilic, and cephalic veins are assessed on the requested side. Spectral and Color Doppler data is the primary method for evaluating the brachiocephalic and subclavian veins. Venous compression is used to evaluate the internal jugular, axillary, and upper arm veins.  Limitations: Patient head postioning.  Duplex Findings Right No abnormality of venous architecture is observed in the central veins. All Doppler signals are appropriately pulsatile with ventilatory excursions. Color Doppler notes appropriate filling in all vessels. Left No abnormality of venous architecture is observed in the central veins. All Doppler signals are appropriately pulsatile with ventilatory excursions. Color Doppler notes appropriate filling in all vessels. The arm veins appear fully compressible. However, this examination was unable to visualize the subclavian vein due to limitaton noted above. Summary of Findings Right No evidence of obstruction was seen in the central veins. Left No evidence of obstruction was seen in the central veins or arm veins. However, unable to visualize the subclavian vein due to limitaton noted above.  Final Interpretation Right No evidence of DVT detected in the central veins. Left No evidence of DVT detected in the central veins or arm veins. This was a limited study.  Electronically signed by [id number] Jodell Cipro MD on 12/26/2020 at 8:35:35 AM.  XR Abdomen 1 View    Result Date: 12/26/2020  EXAM: XR ABDOMEN 1 VIEW DATE: 12/26/2020 9:13 AM ACCESSION: [id number] UN DICTATED: 12/26/2020 9:34 AM INTERPRETATION LOCATION: [facility] CLINICAL INDICATION: 80 years old Female with CONSTIPATION  COMPARISON: Abdominal radiograph Dec 23, 2020. CT abdomen pelvis Dec 19, 2020. TECHNIQUE: Supine views of the abdomen. FINDINGS: Lung bases are not well evaluated on current exam. Urethral catheter overlies the bladder. Numerous loops of dilated air and stool-filled bowel project over the abdomen with a loop of descending colon measures up to 6.2 cm. Air-filled mildly dilated small bowel. Severe degenerative changes of the visualized thoracolumbar spine with severe dextroscoliosis. Scattered pelvic phleboliths. Multiple anastomotic sutures throughout the abdomen/pelvis, consistent with history of multiple bowel resections. There is an abrupt loss of gaseous distention of the descending colon at an anastomotic suture line, which could represent an obstruction. However, there appears to be a persistent large rectal stool burden. Scattered vascular calcifications. Right-sided tubal ligation clip noted.     Dilated small and large bowel, as above, probably representing ileus, similar to prior. Moderate to large stool burden, consistent with stated history of constipation, slightly increased compared with prior.    XR Abdomen 1 View    Result Date: 12/24/2020  EXAM: XR ABDOMEN 1 VIEW DATE: 12/23/2020 3:14 PM ACCESSION: [id number] UN DICTATED: 12/24/2020 2:57 PM INTERPRETATION LOCATION: [facility] CLINICAL INDICATION: 80 years old Female with CONSTIPATION irritable bowel with both constipation and diarrhea COMPARISON: Abdomen dated November 20, 2020. CT dated Dec 19, 2020 TECHNIQUE: Portable supine abdomen was performed. FINDINGS: Again, moderate-large volume stool burden overall, improved compared to prior study. Mildly distended small bowel. No obstructing bowel pattern. No free air. Visualized lung bases are clear     Moderate-large colonic stool burden however overall, decreased compared to prior. No obstructing bowel pattern.    XR Abdomen 1 View    Result Date: 12/20/2020  EXAM: XR ABDOMEN 1 VIEW DATE: 12/20/2020 11:03 AM ACCESSION: [id number] UN DICTATED: 12/20/2020 12:44 PM INTERPRETATION LOCATION: [facility] CLINICAL INDICATION: 80 years old Female with INTESTINAL OBSTRUCTION  COMPARISON: CT abdomen/pelvis 12/19/2020; abdominal radiograph 06/08/2020 TECHNIQUE: Supine view of the abdomen. FINDINGS: Redemonstrated very large rectal stool ball with upstream dilatation of large and small bowel diffusely. Suture lines in the left lower quadrant. Urinary catheter with tip overlying the left pelvis. Pelvic phleboliths. Multilevel degenerative changes of the spine with dextroscoliosis of the lumbar spine and mild rightward listhesis of L3 on L4. For findings above the diaphragm, please refer to concurrent chest radiograph.     Redemonstrated very large rectal stool ball with upstream dilatation of large and small bowel diffusely, similar to previous day's CT exam.    ______________________________________________________________________  Discharge Instructions:   Activity Instructions     Activity as tolerated            Diet Instructions     Discharge diet (specify)      Discharge Nutrition Therapy: [employer] Dysphagia    Dysphagia Liquid Level: Thin Liquid, Level 0    Dysphagia Food Level: Soft & Bite Sized, Level 6 Administered Ensure Plus 2 times daily with meals                 Appointments which have been scheduled for you    Jan 12, 2021  2:30 PM  RETURN  CORNEA with Hussam Rayna Sexton, MD  [facility] [address] [place] [facility]) [address]  [address]  [place] [place] [id number]  [phone]  ______________________________________________________________________  Discharge Day Services:  BP 146/99  - Pulse 87  - Temp 36.7 ??C (98.1 ??F) (Axillary)  - Resp 18  - Ht 152.4 cm (5')  - Wt 49.2 kg (108 lb 7.5 oz)  - LMP  (LMP Unknown)  - SpO2 96%  - BMI 21.18 kg/m??   Pt seen on the day of discharge and determined appropriate for discharge.  GEN: NAD, lying in bed, flat affect, soft spoke, oriented to person/birthday/[facility]/May 2022/president  CV: RRR, nl S1/S2  PULM: Decreased BS anteriorly, no increased WOB  ABD: soft, NT/ND, +BS, +RLQ hernia which is chronic and no significant pain upon palpation  EXT: Improved left arm edema - likely dependent in origin    Condition at Discharge: good    Length of Discharge: I spent greater than 30 mins in the discharge of this patient.

## 2021-01-02 NOTE — Unmapped (Signed)
[facility] Administered Medication Refill Coordination Note      NAME:Cindy [name] DOB: 19-Apr-1941      Medication: [name]  Day Supply: 28 days      SHIPPING      Next delivery from [facility] [phone]) to [facility] [place] for Cindy [name] is scheduled for n/a.    Clinic contact: Roselind Messier    Patient's next nurse visit for administration: n/a clinic denied refill @ this time advised will f/u in 3 weeks.    We will follow up with clinic monthly for standard refill processing and delivery.      Huntley Knoop Samella Parr  Specialty Pharmacy Technician

## 2021-01-05 NOTE — Unmapped (Signed)
Message was forwarded to you on 5/18

## 2021-01-05 NOTE — Unmapped (Signed)
Cindy [name]'s daughter is calling - wants to make sure the orders for her meds got sent to the nursing home

## 2021-01-08 NOTE — Unmapped (Signed)
Telephone call with [name] to discuss Ms. Dzik's care. [name] reports that Ms. Royse is now living a [address] and Rehab ([facility]) in [place]. Requesting medications orders be faxed to the facility including orders for LAI. Ms. 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 [name] does not wish to make any medication changes that may be disruptive to Ms. Simerson anxiety or depression at this time.   Provided contact information including [phone]  fax: [phone]

## 2021-01-12 ENCOUNTER — Emergency Department: Payer: Medicare HMO

## 2021-01-12 ENCOUNTER — Other Ambulatory Visit: Payer: Self-pay

## 2021-01-12 ENCOUNTER — Telehealth: Payer: Self-pay | Admitting: Primary Care

## 2021-01-12 ENCOUNTER — Non-Acute Institutional Stay: Payer: 59 | Admitting: Primary Care

## 2021-01-12 ENCOUNTER — Inpatient Hospital Stay
Admission: EM | Admit: 2021-01-12 | Discharge: 2021-01-17 | DRG: 698 | Disposition: E | Payer: Medicare HMO | Attending: Internal Medicine | Admitting: Internal Medicine

## 2021-01-12 DIAGNOSIS — I2510 Atherosclerotic heart disease of native coronary artery without angina pectoris: Secondary | ICD-10-CM | POA: Diagnosis present

## 2021-01-12 DIAGNOSIS — N390 Urinary tract infection, site not specified: Secondary | ICD-10-CM | POA: Diagnosis present

## 2021-01-12 DIAGNOSIS — F0390 Unspecified dementia without behavioral disturbance: Secondary | ICD-10-CM | POA: Diagnosis present

## 2021-01-12 DIAGNOSIS — Z681 Body mass index (BMI) 19 or less, adult: Secondary | ICD-10-CM | POA: Diagnosis not present

## 2021-01-12 DIAGNOSIS — T83518A Infection and inflammatory reaction due to other urinary catheter, initial encounter: Principal | ICD-10-CM | POA: Diagnosis present

## 2021-01-12 DIAGNOSIS — J9601 Acute respiratory failure with hypoxia: Principal | ICD-10-CM | POA: Diagnosis present

## 2021-01-12 DIAGNOSIS — Z515 Encounter for palliative care: Secondary | ICD-10-CM

## 2021-01-12 DIAGNOSIS — R7989 Other specified abnormal findings of blood chemistry: Secondary | ICD-10-CM

## 2021-01-12 DIAGNOSIS — E041 Nontoxic single thyroid nodule: Secondary | ICD-10-CM | POA: Diagnosis present

## 2021-01-12 DIAGNOSIS — T83511A Infection and inflammatory reaction due to indwelling urethral catheter, initial encounter: Secondary | ICD-10-CM | POA: Diagnosis present

## 2021-01-12 DIAGNOSIS — F0280 Dementia in other diseases classified elsewhere without behavioral disturbance: Secondary | ICD-10-CM | POA: Diagnosis not present

## 2021-01-12 DIAGNOSIS — R4182 Altered mental status, unspecified: Secondary | ICD-10-CM

## 2021-01-12 DIAGNOSIS — M419 Scoliosis, unspecified: Secondary | ICD-10-CM | POA: Diagnosis present

## 2021-01-12 DIAGNOSIS — Z79899 Other long term (current) drug therapy: Secondary | ICD-10-CM

## 2021-01-12 DIAGNOSIS — Z66 Do not resuscitate: Secondary | ICD-10-CM | POA: Diagnosis present

## 2021-01-12 DIAGNOSIS — J189 Pneumonia, unspecified organism: Secondary | ICD-10-CM | POA: Diagnosis present

## 2021-01-12 DIAGNOSIS — Z20822 Contact with and (suspected) exposure to covid-19: Secondary | ICD-10-CM | POA: Diagnosis present

## 2021-01-12 DIAGNOSIS — J9600 Acute respiratory failure, unspecified whether with hypoxia or hypercapnia: Secondary | ICD-10-CM | POA: Diagnosis not present

## 2021-01-12 DIAGNOSIS — Z886 Allergy status to analgesic agent status: Secondary | ICD-10-CM

## 2021-01-12 DIAGNOSIS — M21372 Foot drop, left foot: Secondary | ICD-10-CM | POA: Diagnosis present

## 2021-01-12 DIAGNOSIS — Z7189 Other specified counseling: Secondary | ICD-10-CM | POA: Diagnosis not present

## 2021-01-12 DIAGNOSIS — R4701 Aphasia: Secondary | ICD-10-CM | POA: Diagnosis present

## 2021-01-12 DIAGNOSIS — G9341 Metabolic encephalopathy: Secondary | ICD-10-CM | POA: Diagnosis present

## 2021-01-12 DIAGNOSIS — L89150 Pressure ulcer of sacral region, unstageable: Secondary | ICD-10-CM | POA: Diagnosis present

## 2021-01-12 DIAGNOSIS — E872 Acidosis, unspecified: Secondary | ICD-10-CM

## 2021-01-12 DIAGNOSIS — S31000A Unspecified open wound of lower back and pelvis without penetration into retroperitoneum, initial encounter: Secondary | ICD-10-CM

## 2021-01-12 DIAGNOSIS — G309 Alzheimer's disease, unspecified: Secondary | ICD-10-CM

## 2021-01-12 DIAGNOSIS — E440 Moderate protein-calorie malnutrition: Secondary | ICD-10-CM | POA: Diagnosis present

## 2021-01-12 DIAGNOSIS — Z888 Allergy status to other drugs, medicaments and biological substances status: Secondary | ICD-10-CM | POA: Diagnosis not present

## 2021-01-12 DIAGNOSIS — R6520 Severe sepsis without septic shock: Secondary | ICD-10-CM | POA: Diagnosis present

## 2021-01-12 DIAGNOSIS — R778 Other specified abnormalities of plasma proteins: Secondary | ICD-10-CM

## 2021-01-12 DIAGNOSIS — Z87440 Personal history of urinary (tract) infections: Secondary | ICD-10-CM | POA: Diagnosis not present

## 2021-01-12 DIAGNOSIS — F259 Schizoaffective disorder, unspecified: Secondary | ICD-10-CM | POA: Diagnosis present

## 2021-01-12 DIAGNOSIS — I2694 Multiple subsegmental pulmonary emboli without acute cor pulmonale: Secondary | ICD-10-CM | POA: Diagnosis present

## 2021-01-12 DIAGNOSIS — Z978 Presence of other specified devices: Secondary | ICD-10-CM

## 2021-01-12 DIAGNOSIS — A419 Sepsis, unspecified organism: Secondary | ICD-10-CM | POA: Diagnosis present

## 2021-01-12 DIAGNOSIS — Y846 Urinary catheterization as the cause of abnormal reaction of the patient, or of later complication, without mention of misadventure at the time of the procedure: Secondary | ICD-10-CM | POA: Diagnosis present

## 2021-01-12 DIAGNOSIS — I2699 Other pulmonary embolism without acute cor pulmonale: Secondary | ICD-10-CM | POA: Diagnosis present

## 2021-01-12 DIAGNOSIS — Z91013 Allergy to seafood: Secondary | ICD-10-CM

## 2021-01-12 DIAGNOSIS — E46 Unspecified protein-calorie malnutrition: Secondary | ICD-10-CM | POA: Diagnosis present

## 2021-01-12 HISTORY — DX: Unspecified dementia, unspecified severity, without behavioral disturbance, psychotic disturbance, mood disturbance, and anxiety: F03.90

## 2021-01-12 HISTORY — DX: Schizophrenia, unspecified: F20.9

## 2021-01-12 LAB — MAGNESIUM: Magnesium: 2.2 mg/dL (ref 1.7–2.4)

## 2021-01-12 LAB — CBC WITH DIFFERENTIAL/PLATELET
Abs Immature Granulocytes: 0.1 10*3/uL — ABNORMAL HIGH (ref 0.00–0.07)
Basophils Absolute: 0.1 10*3/uL (ref 0.0–0.1)
Basophils Relative: 1 %
Eosinophils Absolute: 0 10*3/uL (ref 0.0–0.5)
Eosinophils Relative: 0 %
HCT: 32.7 % — ABNORMAL LOW (ref 36.0–46.0)
Hemoglobin: 10.9 g/dL — ABNORMAL LOW (ref 12.0–15.0)
Immature Granulocytes: 1 %
Lymphocytes Relative: 3 %
Lymphs Abs: 0.5 10*3/uL — ABNORMAL LOW (ref 0.7–4.0)
MCH: 30.8 pg (ref 26.0–34.0)
MCHC: 33.3 g/dL (ref 30.0–36.0)
MCV: 92.4 fL (ref 80.0–100.0)
Monocytes Absolute: 0.5 10*3/uL (ref 0.1–1.0)
Monocytes Relative: 3 %
Neutro Abs: 16.1 10*3/uL — ABNORMAL HIGH (ref 1.7–7.7)
Neutrophils Relative %: 92 %
Platelets: 174 10*3/uL (ref 150–400)
RBC: 3.54 MIL/uL — ABNORMAL LOW (ref 3.87–5.11)
RDW: 14.5 % (ref 11.5–15.5)
Smear Review: NORMAL
WBC: 17.3 10*3/uL — ABNORMAL HIGH (ref 4.0–10.5)
nRBC: 0 % (ref 0.0–0.2)

## 2021-01-12 LAB — RESP PANEL BY RT-PCR (FLU A&B, COVID) ARPGX2
Influenza A by PCR: NEGATIVE
Influenza B by PCR: NEGATIVE
SARS Coronavirus 2 by RT PCR: NEGATIVE

## 2021-01-12 LAB — COMPREHENSIVE METABOLIC PANEL
ALT: 13 U/L (ref 0–44)
AST: 18 U/L (ref 15–41)
Albumin: 2.6 g/dL — ABNORMAL LOW (ref 3.5–5.0)
Alkaline Phosphatase: 57 U/L (ref 38–126)
Anion gap: 14 (ref 5–15)
BUN: 33 mg/dL — ABNORMAL HIGH (ref 8–23)
CO2: 20 mmol/L — ABNORMAL LOW (ref 22–32)
Calcium: 9.5 mg/dL (ref 8.9–10.3)
Chloride: 112 mmol/L — ABNORMAL HIGH (ref 98–111)
Creatinine, Ser: 0.56 mg/dL (ref 0.44–1.00)
GFR, Estimated: >60 mL/min (ref >60)
Glucose, Bld: 133 mg/dL — ABNORMAL HIGH (ref 70–99)
Potassium: 3.6 mmol/L (ref 3.5–5.1)
Sodium: 146 mmol/L — ABNORMAL HIGH (ref 135–145)
Total Bilirubin: 1.8 mg/dL — ABNORMAL HIGH (ref 0.3–1.2)
Total Protein: 6.4 g/dL — ABNORMAL LOW (ref 6.5–8.1)

## 2021-01-12 LAB — URINALYSIS, COMPLETE (UACMP) WITH MICROSCOPIC
Bilirubin Urine: NEGATIVE
Glucose, UA: NEGATIVE mg/dL
Ketones, ur: 20 mg/dL — AB
Nitrite: POSITIVE — AB
Protein, ur: 100 mg/dL — AB
Specific Gravity, Urine: 1.029 (ref 1.005–1.030)
WBC, UA: >50 WBC/hpf — ABNORMAL HIGH (ref 0–5)
pH: 5 (ref 5.0–8.0)

## 2021-01-12 LAB — APTT: aPTT: 33 seconds (ref 24–36)

## 2021-01-12 LAB — LACTIC ACID, PLASMA
Lactic Acid, Venous: 2.1 mmol/L (ref 0.5–1.9)
Lactic Acid, Venous: 3.1 mmol/L (ref 0.5–1.9)

## 2021-01-12 LAB — PROTIME-INR
INR: 1.2 (ref 0.8–1.2)
Prothrombin Time: 15.5 seconds — ABNORMAL HIGH (ref 11.4–15.2)

## 2021-01-12 LAB — TROPONIN I (HIGH SENSITIVITY)
Troponin I (High Sensitivity): 65 ng/L — ABNORMAL HIGH (ref <18)
Troponin I (High Sensitivity): 64 ng/L — ABNORMAL HIGH (ref <18)

## 2021-01-12 LAB — D-DIMER, QUANTITATIVE: D-Dimer, Quant: 12.09 ug/mL-FEU — ABNORMAL HIGH (ref 0.00–0.50)

## 2021-01-12 LAB — PROCALCITONIN: Procalcitonin: 0.79 ng/mL

## 2021-01-12 MED ORDER — SODIUM CHLORIDE 0.9 % IV SOLN
2.0000 g | Freq: Once | INTRAVENOUS | Status: AC
  Administered 2021-01-12: 2 g via INTRAVENOUS
  Filled 2021-01-12: qty 2

## 2021-01-12 MED ORDER — SODIUM CHLORIDE 0.9% FLUSH
3.0000 mL | Freq: Two times a day (BID) | INTRAVENOUS | Status: DC
  Administered 2021-01-12 – 2021-01-13 (×2): 3 mL via INTRAVENOUS

## 2021-01-12 MED ORDER — ACETAMINOPHEN 325 MG PO TABS: 650.0000 mg | ORAL_TABLET | Freq: Four times a day (QID) | ORAL | Status: DC | PRN

## 2021-01-12 MED ORDER — LACTATED RINGERS IV BOLUS: 500.0000 mL | Freq: Once | INTRAVENOUS | Status: DC

## 2021-01-12 MED ORDER — HEPARIN BOLUS VIA INFUSION
3100.0000 [IU] | Freq: Once | INTRAVENOUS | Status: DC
  Filled 2021-01-12: qty 3100

## 2021-01-12 MED ORDER — LACTATED RINGERS IV BOLUS (SEPSIS)
500.0000 mL | Freq: Once | INTRAVENOUS | Status: AC
  Administered 2021-01-12: 500 mL via INTRAVENOUS

## 2021-01-12 MED ORDER — MORPHINE SULFATE (PF) 2 MG/ML IV SOLN
1.0000 mg | INTRAVENOUS | Status: DC | PRN
  Administered 2021-01-12 – 2021-01-13 (×3): 2 mg via INTRAVENOUS
  Filled 2021-01-12 (×3): qty 1

## 2021-01-12 MED ORDER — BIOTENE DRY MOUTH MT LIQD
15.0000 mL | OROMUCOSAL | Status: DC | PRN
  Filled 2021-01-12 (×2): qty 15

## 2021-01-12 MED ORDER — ACETAMINOPHEN 650 MG RE SUPP
650.0000 mg | Freq: Four times a day (QID) | RECTAL | Status: DC | PRN
  Administered 2021-01-12: 650 mg via RECTAL
  Filled 2021-01-12: qty 1

## 2021-01-12 MED ORDER — ASPIRIN 300 MG RE SUPP
300.0000 mg | Freq: Once | RECTAL | Status: AC
  Administered 2021-01-12: 300 mg via RECTAL
  Filled 2021-01-12: qty 1

## 2021-01-12 MED ORDER — GLYCOPYRROLATE 1 MG PO TABS
1.0000 mg | ORAL_TABLET | ORAL | Status: DC | PRN
  Filled 2021-01-12: qty 1

## 2021-01-12 MED ORDER — LACTATED RINGERS IV BOLUS (SEPSIS)
1000.0000 mL | Freq: Once | INTRAVENOUS | Status: AC
  Administered 2021-01-12: 1000 mL via INTRAVENOUS

## 2021-01-12 MED ORDER — SODIUM CHLORIDE 0.9 % IV SOLN: 250.0000 mL | INTRAVENOUS | Status: DC | PRN

## 2021-01-12 MED ORDER — LACTATED RINGERS IV SOLN: INTRAVENOUS | Status: DC

## 2021-01-12 MED ORDER — ONDANSETRON HCL 4 MG/2ML IJ SOLN: 4.0000 mg | Freq: Four times a day (QID) | INTRAMUSCULAR | Status: DC | PRN

## 2021-01-12 MED ORDER — IOHEXOL 350 MG/ML SOLN
50.0000 mL | Freq: Once | INTRAVENOUS | Status: AC | PRN
  Administered 2021-01-12: 50 mL via INTRAVENOUS

## 2021-01-12 MED ORDER — HALOPERIDOL LACTATE 5 MG/ML IJ SOLN: 0.5000 mg | INTRAMUSCULAR | Status: DC | PRN

## 2021-01-12 MED ORDER — GLYCOPYRROLATE 0.2 MG/ML IJ SOLN
0.2000 mg | INTRAMUSCULAR | Status: DC | PRN
  Filled 2021-01-12: qty 1

## 2021-01-12 MED ORDER — HALOPERIDOL 0.5 MG PO TABS
0.5000 mg | ORAL_TABLET | ORAL | Status: DC | PRN
  Filled 2021-01-12: qty 1

## 2021-01-12 MED ORDER — ONDANSETRON 4 MG PO TBDP
4.0000 mg | ORAL_TABLET | Freq: Four times a day (QID) | ORAL | Status: DC | PRN
  Administered 2021-01-12: 4 mg via ORAL
  Filled 2021-01-12 (×2): qty 1

## 2021-01-12 MED ORDER — ACETAMINOPHEN 650 MG RE SUPP
650.0000 mg | Freq: Once | RECTAL | Status: AC
  Administered 2021-01-12: 650 mg via RECTAL
  Filled 2021-01-12: qty 1

## 2021-01-12 MED ORDER — POLYVINYL ALCOHOL 1.4 % OP SOLN
1.0000 [drp] | Freq: Four times a day (QID) | OPHTHALMIC | Status: DC | PRN
  Filled 2021-01-12: qty 15

## 2021-01-12 MED ORDER — HALOPERIDOL LACTATE 2 MG/ML PO CONC
0.5000 mg | ORAL | Status: DC | PRN
  Filled 2021-01-12: qty 0.3

## 2021-01-12 MED ORDER — HEPARIN (PORCINE) 25000 UT/250ML-% IV SOLN: 800.0000 [IU]/h | INTRAVENOUS | Status: DC

## 2021-01-12 MED ORDER — SODIUM CHLORIDE 0.9% FLUSH: 3.0000 mL | INTRAVENOUS | Status: DC | PRN

## 2021-01-12 MED ORDER — GLYCOPYRROLATE 0.2 MG/ML IJ SOLN
0.2000 mg | INTRAMUSCULAR | Status: DC | PRN
  Administered 2021-01-13 (×2): 0.2 mg via INTRAVENOUS
  Filled 2021-01-12 (×3): qty 1

## 2021-01-12 MED ORDER — MORPHINE SULFATE (PF) 2 MG/ML IV SOLN
1.0000 mg | INTRAVENOUS | Status: DC | PRN
Start: 2021-01-12 — End: 2021-01-12

## 2021-01-12 MED ORDER — VANCOMYCIN HCL IN DEXTROSE 1-5 GM/200ML-% IV SOLN
1000.0000 mg | Freq: Once | INTRAVENOUS | Status: AC
  Administered 2021-01-12: 1000 mg via INTRAVENOUS
  Filled 2021-01-12: qty 200

## 2021-01-12 NOTE — ED Notes (Signed)
Called pharmacy for cefepime dose, pyxis out of stock.

## 2021-01-12 NOTE — Sepsis Progress Note (Signed)
Code sepsis being followed by eLink

## 2021-01-12 NOTE — H&P (Addendum)
History and Physical    Emily Murillo WYO:[id number] DOB: 1941/07/06 DOA: 2021/01/27  PCP: Keane Police, MD   Patient coming from: SNF  I have personally briefly reviewed patient's old medical records in [facility]  Chief Complaint: Change in mental status  HPI: Emily Murillo is a 80 y.o. 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 She was placed on a nonrebreather mask @ 10L by EMS with improvement in her pulse oximetry to 95% and was transported to the ER for further evaluation. Upon arrival to the ER she was noted to be very lethargic and responds only to painful stimuli.  She is currently on high flow oxygen at 12 L. I am unable to do review of systems on this patient due to her mental status. Labs show sodium 146, potassium 3.6, chloride 112, bicarb 20, glucose 133, BUN 33, creatinine 0.56, calcium 9.5, magnesium 2.2, alkaline phosphatase 57, albumin 2.6, AST 18, ALT 13, total protein 6.4, troponin 64, lactic acid 2.1 >> 3.1, procalcitonin 0.79, white count 17.3, hemoglobin 10.9, hematocrit 32.7, MCV 92.4, RDW 14.5, platelet count 174, D-dimer 12.09, PT 15.5, INR 1.2 Respiratory viral panel is negative Urine analysis shows significant pyuria Chest x-ray reviewed by me shows no acute pulmonary disease. CT scan of the head without contrast shows age related atrophy. 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 Chronic small-vessel changes of the white matter. No acute or reversible finding. CT scan of chest, abdomen and pelvis shows Bilateral pulmonary emboli are noted. Large bilateral lower lobe airspace opacities are noted concerning for  pneumonia or atelectasis. 4 cm left thyroid nodule is noted. Recommend thyroid US. Moderately dilated distal sigmoid colon and rectum is noted with wall thickening and surrounding inflammation concerning for infectious or inflammatory colitis. Postsurgical changes are seen involving the proximal sigmoid colon. Small to moderate size hernia is noted in right lower quadrant which appears to contain a loop of bowel, but does not appear to be resulting in definite obstruction. Minimal ascites. Coronary artery calcifications are noted. Twelve-lead EKG reviewed by me shows sinus tachycardia   ED Course: Patient is an 80 year old female with a past medical history significant for schizoaffective disorder, dementia, chronic indwelling Foley catheter secondary to urinary incontinence who was sent from the skilled nursing facility to the ER for evaluation of change in mental status. She was noted to be hypoxic with room air pulse oximetry of 81% and is currently on high flow oxygen at 12 L.  She is tachycardic and febrile with a T-max of 102. She has pyuria ,  a chronic indwelling Foley catheter and  unstageable sacral wound CT angiogram shows bilateral pulmonary emboli as well as bilateral lower lobe airspace disease. Patient was seen in consultation by the critical care physician as well as palliative medicine and is currently a DNR will be admitted for comfort measures    Review of Systems: As per HPI otherwise all other systems reviewed and negative.    Past Medical History:  Diagnosis Date  . Dementia (HCC)   . Schizophrenia (HCC)       reports that she has never smoked. She does not have any smokeless tobacco history on file. She reports that  she does not drink alcohol and does not use drugs.  Allergies  Allergen Reactions  . Amitiza [Lubiprostone]   . Aspirin   . Baclofen   . Bismuth-Containing Compounds   . Carbamazepine   . Ciprofloxacin   . Escitalopram   . Hydrochlorothiazide    . Prednisone   . Raloxifene   . Shellfish Allergy   . Wellbutrin [Bupropion]     Family History  Family history unknown: Yes      Prior to Admission medications   Medication Sig Start Date End Date Taking? Authorizing Provider  acetaminophen (TYLENOL) 325 MG tablet Take 650 mg by mouth 3 (three) times daily.   Yes [provider]  alum & mag hydroxide-simeth (MAALOX/MYLANTA) 200-200-20 MG/5ML suspension Take 15 mLs by mouth in the morning, at noon, and at bedtime.   Yes [provider]  amLODipine (NORVASC) 5 MG tablet Take 5 mg by mouth daily.   Yes [provider]  ascorbic acid (VITAMIN C) 500 MG tablet Take 500 mg by mouth daily.   Yes [provider]  ASHWAGANDHA PO Take 800 mg by mouth 2 (two) times daily.   Yes [provider]  bisacodyl (DULCOLAX) 5 MG EC tablet Take 5 mg by mouth every Monday, Wednesday, and Friday.   Yes [provider]  carboxymethylcellulose 1 % ophthalmic solution Place 2 drops into both eyes every 2 (two) hours as needed (dry eyes).   Yes [provider]  cholecalciferol (VITAMIN D3) 25 MCG (1000 UNIT) tablet Take 2,000 Units by mouth daily.   Yes [provider]  cromolyn (NASALCROM) 5.2 MG/ACT nasal spray Place 1 spray into both nostrils in the morning and at bedtime.   Yes [provider]  diclofenac Sodium (VOLTAREN) 1 % GEL Apply 1 g topically 4 (four) times daily.   Yes [provider]  docusate sodium (COLACE) 100 MG capsule Take 100 mg by mouth 2 (two) times daily.   Yes [provider]  Folic Acid-Vit B6-Vit B12 (FOLTABS 800) 0.8-10-0.115 MG TABS Take 1 tablet by mouth every evening.   Yes [provider]  gabapentin (NEURONTIN) 100 MG capsule Take 100 mg by mouth at bedtime.   Yes [provider]  glycerin adult 2 g suppository Place 1 suppository rectally daily as needed for constipation.   Yes [provider]   ketoconazole (NIZORAL) 2 % shampoo Apply 1 application topically 2 (two) times a week. (Monday and Thursday)   Yes [provider]  lidocaine (LMX) 4 % cream Apply 1 application topically 3 (three) times daily as needed (pain). (low back pain)   Yes [provider]  magnesium oxide (MAG-OX) 400 MG tablet Take 400 mg by mouth daily.   Yes [provider]  Misc Natural Product Nasal (PONARIS NA) Place 1 spray into both nostrils in the morning and at bedtime.   Yes [provider]  paliperidone (INVEGA SUSTENNA) 156 MG/ML SUSY injection Inject 156 mg into the muscle once.   Yes [provider]  polyethylene glycol (MIRALAX / GLYCOLAX) 17 g packet Take 17 g by mouth daily.   Yes [provider]  potassium chloride SA (KLOR-CON) 20 MEQ tablet Take 20 mEq by mouth daily.   Yes [provider]  senna (SENOKOT) 8.6 MG TABS tablet Take 1 tablet by mouth 2 (two) times daily.   Yes [provider]  sertraline (ZOLOFT) 100 MG tablet Take 100 mg by mouth daily.   Yes [provider]  zinc sulfate 220 (50 Zn) MG capsule Take 220 mg by mouth daily.   Yes [provider]    Physical Exam: Vitals:   12/20/2020 1132 01/11/2021 1400 12/21/2020 1557 01/01/2021 1600  BP:  (!) 143/88  116/71  Pulse:  (!) 126  (!) 115  Resp:  (!) 28  (!) 25  Temp: (!) 101.5 F (38.6 C) 98.7 F (37.1 C)  99.1 F (37.3 C)  TempSrc: Rectal     SpO2:  92%  94%  Weight:      Height:   5' (1.524 m)      Vitals:   01/11/2021 1132 01/16/2021 1400 12/18/2020 1557 12/28/2020 1600  BP:  (!) 143/88  116/71  Pulse:  (!) 126  (!) 115  Resp:  (!) 28  (!) 25  Temp: (!) 101.5 F (38.6 C) 98.7 F (37.1 C)  99.1 F (37.3 C)  TempSrc: Rectal     SpO2:  92%  94%  Weight:      Height:   5' (1.524 m)       Constitutional:  Lethargic and only responds to painful stimuli.  Thin and frail.  Tachypneic. HEENT:      Head: Normocephalic and atraumatic.          Eyes: PERLA, EOMI, Conjunctivae are normal. Sclera is non-icteric.       Mouth/Throat: Mucous membranes are dry.       Neck: Supple with no signs of meningismus. Cardiovascular: Tachycardic. No murmurs, gallops, or rubs. 2+ symmetrical distal pulses are present . No JVD. No LE edema Respiratory:  Tachypnea.  Crackles and rhonchi bilaterally . No wheezes Gastrointestinal: Soft, non tender, and non distended with positive bowel sounds.  Genitourinary: No CVA tenderness. Musculoskeletal:  Contracted lower extremities bilaterally Neurologic: Unable to assess. Skin:  Unstageable sacral decubitus ulcers Psychiatric: Unable to assess   Labs on Admission: I have personally reviewed following labs and imaging studies  CBC: Recent Labs  Lab 01/08/2021 1134  WBC 17.3*  NEUTROABS 16.1*  HGB 10.9*  HCT 32.7*  MCV 92.4  PLT 174   Basic Metabolic Panel: Recent Labs  Lab 01/05/2021 1134  NA 146*  K 3.6  CL 112*  CO2 20*  GLUCOSE 133*  BUN 33*  CREATININE 0.56  CALCIUM 9.5  MG 2.2   GFR: Estimated Creatinine Clearance: 40.2 mL/min (by C-G formula based on SCr of 0.56 mg/dL). Liver Function Tests: Recent Labs  Lab 01/16/2021 1134  AST 18  ALT 13  ALKPHOS 57  BILITOT 1.8*  PROT 6.4*  ALBUMIN 2.6*   No results for input(s): LIPASE, AMYLASE in the last 168 hours. No results for input(s): AMMONIA in the last 168 hours. Coagulation Profile: Recent Labs  Lab 01/11/2021 1134  INR 1.2   Cardiac Enzymes: No results for input(s): CKTOTAL, CKMB, CKMBINDEX, TROPONINI in the last 168 hours. BNP (last 3 results) No results for input(s): PROBNP in the last 8760 hours. HbA1C: No results for input(s): HGBA1C in the last 72 hours. CBG: No results for input(s): GLUCAP in the last 168 hours. Lipid Profile: No results for input(s): CHOL, HDL, LDLCALC, TRIG, CHOLHDL, LDLDIRECT in the last 72 hours. Thyroid Function Tests: No results for input(s): TSH, T4TOTAL, FREET4, T3FREE, THYROIDAB in the  last 72 hours. Anemia Panel: No results for input(s): VITAMINB12, FOLATE, FERRITIN, TIBC, IRON, RETICCTPCT in the last 72 hours. Urine analysis:    Component Value Date/Time   COLORURINE AMBER (A) 12/26/2020 1134   APPEARANCEUR CLOUDY (  A) 01/28/21 1134   LABSPEC 1.029 2021/01/28 1134   PHURINE 5.0 January 28, 2021 1134   GLUCOSEU NEGATIVE 01-28-2021 1134   HGBUR SMALL (A) 28-Jan-2021 1134   BILIRUBINUR NEGATIVE 2021-01-28 1134   KETONESUR 20 (A) 01-28-21 1134   PROTEINUR 100 (A) 01/28/21 1134   NITRITE POSITIVE (A) 2021/01/28 1134   LEUKOCYTESUR MODERATE (A) 2021-01-28 1134    Radiological Exams on Admission: CT Head Wo Contrast  Result Date: 01/28/2021 CLINICAL DATA:  Mental status changes of unknown cause. EXAM: CT HEAD WITHOUT CONTRAST TECHNIQUE: Contiguous axial images were obtained from the base of the skull through the vertex without intravenous contrast. COMPARISON:  None. FINDINGS: Brain: Age related atrophy. No focal abnormality affects the brainstem or cerebellum. 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 No cortical or large vessel territory infarction. No sign of acute infarction. No mass, hemorrhage, hydrocephalus or extra-axial collection. Vascular: There is atherosclerotic calcification of the major vessels at the base of the brain. Skull: Negative Sinuses/Orbits: Clear/normal Other: None IMPRESSION: Age related atrophy. 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 Chronic small-vessel changes of the white matter. No acute or reversible finding. Electronically Signed   By: Paulina Fusi M.D.   On: 01/28/2021 12:57   CT Angio Chest PE W and/or Wo Contrast  Result Date: January 28, 2021 CLINICAL DATA:  Abdominal pain, fever. EXAM: CT ANGIOGRAPHY CHEST CT ABDOMEN AND PELVIS WITH CONTRAST TECHNIQUE: Multidetector CT imaging of the chest was performed using the standard protocol during bolus administration of  intravenous contrast. Multiplanar CT image reconstructions and MIPs were obtained to evaluate the vascular anatomy. Multidetector CT imaging of the abdomen and pelvis was performed using the standard protocol during bolus administration of intravenous contrast. CONTRAST:  50mL OMNIPAQUE IOHEXOL 350 MG/ML SOLN COMPARISON:  None. FINDINGS: CTA CHEST FINDINGS Cardiovascular: Multiple segmental or smaller filling defects are seen involving the upper and lower lobe branches of the right pulmonary artery. Smaller peripheral defects are noted involving the upper lobe branches of the left pulmonary artery. Mild cardiomegaly. No pericardial effusion. Coronary artery calcifications are noted. Atherosclerosis of thoracic aorta is noted without aneurysm or dissection. Mediastinum/Nodes: 4 cm left thyroid nodule is noted. No adenopathy is noted. Esophagus is unremarkable. Lungs/Pleura: No pneumothorax is noted. Bilateral lower lobe airspace opacities are noted concerning for pneumonia or atelectasis. Musculoskeletal: No chest wall abnormality. No acute or significant osseous findings. Review of the MIP images confirms the above findings. CT ABDOMEN and PELVIS FINDINGS Hepatobiliary: No focal liver abnormality is seen. No gallstones, gallbladder wall thickening, or biliary dilatation. Pancreas: Unremarkable. No pancreatic ductal dilatation or surrounding inflammatory changes. Spleen: Normal in size without focal abnormality. Adrenals/Urinary Tract: Adrenal glands and kidneys appear normal. Foley catheter is noted in urinary bladder. Stomach/Bowel: Stomach is unremarkable. Stool is noted in the colon. No abnormal small bowel dilatation is noted. Moderately dilated sigmoid colon and rectum is noted with wall thickening concerning for infection or inflammation. There is seen inflammatory stranding around this portion of the bowel. Vascular/Lymphatic: Aortic atherosclerosis. No enlarged abdominal or pelvic lymph nodes. Reproductive:  Uterus is not clearly identified and presumably patient is status post hysterectomy. No adnexal abnormality is noted. Other: Minimal ascites is noted. Small to moderate size hernia is noted in the right lower quadrant which appears to contain a loop of bowel, but does not result in definite obstruction. Musculoskeletal: Multilevel degenerative disc disease is noted in the lumbar spine. No acute osseous abnormality is noted.  Review of the MIP images confirms the above findings. IMPRESSION: Bilateral pulmonary emboli are noted. Critical Value/emergent results were called by telephone at the time of interpretation on 2021/01/25 at 3:38 pm to provider [facility] , who verbally acknowledged these results. Large bilateral lower lobe airspace opacities are noted concerning for pneumonia or atelectasis. 4 cm left thyroid nodule is noted. Recommend thyroid US. (Ref: J Am Coll Radiol. 2015 Feb;12(2): 143-50). Moderately dilated distal sigmoid colon and rectum is noted with wall thickening and surrounding inflammation concerning for infectious or inflammatory colitis. Postsurgical changes are seen involving the proximal sigmoid colon. Small to moderate size hernia is noted in right lower quadrant which appears to contain a loop of bowel, but does not appear to be resulting in definite obstruction. Minimal ascites. Coronary artery calcifications are noted. Aortic Atherosclerosis (ICD10-I70.0). Electronically Signed   By: Lupita Raider M.D.   On: 01-25-2021 15:39   CT ABDOMEN PELVIS W CONTRAST  Result Date: Jan 25, 2021 CLINICAL DATA:  Abdominal pain, fever. EXAM: CT ANGIOGRAPHY CHEST CT ABDOMEN AND PELVIS WITH CONTRAST TECHNIQUE: Multidetector CT imaging of the chest was performed using the standard protocol during bolus administration of intravenous contrast. Multiplanar CT image reconstructions and MIPs were obtained to evaluate the vascular anatomy. Multidetector CT imaging of the abdomen and pelvis was performed using  the standard protocol during bolus administration of intravenous contrast. CONTRAST:  3mL OMNIPAQUE IOHEXOL 350 MG/ML SOLN COMPARISON:  None. FINDINGS: CTA CHEST FINDINGS Cardiovascular: Multiple segmental or smaller filling defects are seen involving the upper and lower lobe branches of the right pulmonary artery. Smaller peripheral defects are noted involving the upper lobe branches of the left pulmonary artery. Mild cardiomegaly. No pericardial effusion. Coronary artery calcifications are noted. Atherosclerosis of thoracic aorta is noted without aneurysm or dissection. Mediastinum/Nodes: 4 cm left thyroid nodule is noted. No adenopathy is noted. Esophagus is unremarkable. Lungs/Pleura: No pneumothorax is noted. Bilateral lower lobe airspace opacities are noted concerning for pneumonia or atelectasis. Musculoskeletal: No chest wall abnormality. No acute or significant osseous findings. Review of the MIP images confirms the above findings. CT ABDOMEN and PELVIS FINDINGS Hepatobiliary: No focal liver abnormality is seen. No gallstones, gallbladder wall thickening, or biliary dilatation. Pancreas: Unremarkable. No pancreatic ductal dilatation or surrounding inflammatory changes. Spleen: Normal in size without focal abnormality. Adrenals/Urinary Tract: Adrenal glands and kidneys appear normal. Foley catheter is noted in urinary bladder. Stomach/Bowel: Stomach is unremarkable. Stool is noted in the colon. No abnormal small bowel dilatation is noted. Moderately dilated sigmoid colon and rectum is noted with wall thickening concerning for infection or inflammation. There is seen inflammatory stranding around this portion of the bowel. Vascular/Lymphatic: Aortic atherosclerosis. No enlarged abdominal or pelvic lymph nodes. Reproductive: Uterus is not clearly identified and presumably patient is status post hysterectomy. No adnexal abnormality is noted. Other: Minimal ascites is noted. Small to moderate size hernia is  noted in the right lower quadrant which appears to contain a loop of bowel, but does not result in definite obstruction. Musculoskeletal: Multilevel degenerative disc disease is noted in the lumbar spine. No acute osseous abnormality is noted. Review of the MIP images confirms the above findings. IMPRESSION: Bilateral pulmonary emboli are noted. Critical Value/emergent results were called by telephone at the time of interpretation on January 25, 2021 at 3:38 pm to provider [facility] , who verbally acknowledged these results. Large bilateral lower lobe airspace opacities are noted concerning for pneumonia or atelectasis. 4 cm left thyroid nodule is noted. Recommend thyroid US. (Ref:  J Am Coll Radiol. 2015 Feb;12(2): 143-50). Moderately dilated distal sigmoid colon and rectum is noted with wall thickening and surrounding inflammation concerning for infectious or inflammatory colitis. Postsurgical changes are seen involving the proximal sigmoid colon. Small to moderate size hernia is noted in right lower quadrant which appears to contain a loop of bowel, but does not appear to be resulting in definite obstruction. Minimal ascites. Coronary artery calcifications are noted. Aortic Atherosclerosis (ICD10-I70.0). Electronically Signed   By: Lupita RaiderJames  Green Jr M.D.   On: 07-23-21 15:39   DG Chest Port 1 View  Result Date: 05/04/2021 CLINICAL DATA:  Possible sepsis EXAM: PORTABLE CHEST 1 VIEW COMPARISON:  None. FINDINGS: Heart size within normal limits. Atherosclerosis and tortuosity of the aorta. No sign of pulmonary infiltrate, collapse or effusion. Chronic degenerative changes affect the spine and shoulders. IMPRESSION: No active process evident. Electronically Signed   By: Paulina FusiMark  Shogry M.D.   On: 07-23-21 12:00     Assessment/Plan Principal Problem:   Sepsis (HCC) Active Problems:   Acute respiratory failure (HCC)   Acute lower UTI   Chronic indwelling Foley catheter   Dementia (HCC)   Schizoaffective  disorder (HCC)   Malnutrition (HCC)   Pneumonia   Decubitus ulcer of sacral region, unstageable (HCC)   Pulmonary embolism (HCC)     Patient presents to the emergency room for evaluation of mental status changes and is noted to be septic, source of sepsis appears to be multifactorial and includes a urinary source, infected sacral decubitus ulcers as well as from pneumonia.  She also has acute respiratory failure from pneumonia as well as pulmonary embolism as well as metabolic encephalopathy. Patient's overall prognosis is very poor and after conversation with patient's daughter by intensivist, hospitalist and palliative medicine, patient is currently a DO NOT RESUSCITATE and will be admitted for comfort measures. Her daughter does not want any aggressive measures.  DVT prophylaxis: None Code Status: DNR Family Communication: Greater than 50% of time was spent discussing patient's condition and plan of care with her daughter who is her healthcare power of attorney at the bedside.  All questions and concerns have been addressed.  CODE STATUS was discussed and patient is a DNR.  She will be admitted to the hospital with comfort measures. Disposition Plan: Back to previous home environment Consults called: Palliative care Status:.  I certify that at the point of admission it is my clinical judgment that the patient will require inpatient hospital care that may span beyond 2 midnights    Kami Kube MD [facility]     05/04/2021, 4:30 PM

## 2021-01-12 NOTE — ED Notes (Signed)
pts daughter at bedside, patient appears more alert than previously, is resting quietly with eyes open - no verbal response. Stretcher locked in low position with wheels locked, side rails up x2. Will ask Dr Katrinka Blazing if pt can have po fluids per daughter's request.

## 2021-01-12 NOTE — Progress Notes (Signed)
Notified provider of need to order lactic acid. ° °

## 2021-01-12 NOTE — Progress Notes (Signed)
ANTICOAGULATION CONSULT NOTE - Initial Consult  Pharmacy Consult for Heparin drip Indication: pulmonary embolus  Allergies  Allergen Reactions  . Amitiza [Lubiprostone]   . Aspirin   . Baclofen   . Bismuth-Containing Compounds   . Carbamazepine   . Ciprofloxacin   . Escitalopram   . Hydrochlorothiazide   . Prednisone   . Raloxifene   . Shellfish Allergy   . Wellbutrin [Bupropion]     Patient Measurements: Weight: 45.4 kg (100 lb) Heparin Dosing Weight: 45 kg  Vital Signs: Temp: 98.7 F (37.1 C) (05/27 1400) Temp Source: Rectal (05/27 1132) BP: 143/88 (05/27 1400) Pulse Rate: 126 (05/27 1400)  Labs: Recent Labs    2021/01/26 1134 2021-01-26 1341  HGB 10.9*  --   HCT 32.7*  --   PLT 174  --   APTT 33  --   LABPROT 15.5*  --   INR 1.2  --   CREATININE 0.56  --   TROPONINIHS 65* 64*    CrCl cannot be calculated (Unknown ideal weight.).   Medical History: History reviewed. No pertinent past medical history.  Medications:  Scheduled:  . heparin  3,100 Units Intravenous Once   Infusions:  . heparin    . lactated ringers      Assessment: 80 yo F to start Heparin drip for Pulmonary Embolism. No anticoagulant medications PTA per Med Rec Hgb 10.9  plt 174  INR 1.2  APTT 33  Goal of Therapy:  Heparin level 0.3-0.7 units/ml Monitor platelets by anticoagulation protocol: Yes   Plan:  Give 3100 units bolus x 1 Start heparin infusion at 800 units/hr Check anti-Xa level in 8 hours and daily while on heparin Continue to monitor H&H and platelets  Danniel Tones A Jan 26, 2021,3:54 PM

## 2021-01-12 NOTE — ED Provider Notes (Addendum)
[facility] Emergency Department Provider Note  ____________________________________________   Event Date/Time   First MD Initiated Contact with Patient 2021-02-05 1122     (approximate)  I have reviewed the triage vital signs and the nursing notes.   HISTORY  Chief Complaint Fever and Altered Mental Status   HPI Emily Murillo is a 80 y.o. female with a past medical history of recent urinary tract infection and urinary retention status postplacement of Foley catheter who presents via EMS from SNF for assessment of acute altered mental status hypoxia and fever noticed by staff this morning.  Unclear patient's baseline mental status although on arrival she is confused and unable to provide any significant history.  No history of recent falls or injuries although I am unable to obtain any other additional history calling facility x2 and platelets in the chart x1.         History reviewed. No pertinent past medical history.  Patient Active Problem List   Diagnosis Date Noted  . Sepsis (HCC) 2021/02/05      Prior to Admission medications   Medication Sig Start Date End Date Taking? Authorizing Provider  acetaminophen (TYLENOL) 325 MG tablet Take 650 mg by mouth 3 (three) times daily.   Yes [provider]  alum & mag hydroxide-simeth (MAALOX/MYLANTA) 200-200-20 MG/5ML suspension Take 15 mLs by mouth in the morning, at noon, and at bedtime.   Yes [provider]  amLODipine (NORVASC) 5 MG tablet Take 5 mg by mouth daily.   Yes [provider]  ASHWAGANDHA PO Take 800 mg by mouth 2 (two) times daily.   Yes [provider]  bisacodyl (DULCOLAX) 5 MG EC tablet Take 5 mg by mouth every Monday, Wednesday, and Friday.   Yes [provider]  carboxymethylcellulose 1 % ophthalmic solution Place 2 drops into both eyes every 2 (two) hours as needed (dry eyes).   Yes [provider]  diclofenac Sodium (VOLTAREN) 1  % GEL Apply 1 g topically 4 (four) times daily.   Yes [provider]  docusate sodium (COLACE) 100 MG capsule Take 100 mg by mouth 2 (two) times daily.   Yes [provider]  Folic Acid-Vit B6-Vit B12 (FOLTABS 800) 0.8-10-0.115 MG TABS Take 1 tablet by mouth every evening.   Yes [provider]  gabapentin (NEURONTIN) 100 MG capsule Take 100 mg by mouth at bedtime.   Yes [provider]  glycerin adult 2 g suppository Place 1 suppository rectally daily as needed for constipation.   Yes [provider]  ketoconazole (NIZORAL) 2 % shampoo Apply 1 application topically 2 (two) times a week. (Monday and Thursday)   Yes [provider]  lidocaine (LMX) 4 % cream Apply 1 application topically 3 (three) times daily as needed (pain). (low back pain)   Yes [provider]  magnesium oxide (MAG-OX) 400 MG tablet Take 400 mg by mouth daily.   Yes [provider]  paliperidone (INVEGA SUSTENNA) 156 MG/ML SUSY injection Inject 156 mg into the muscle once.   Yes [provider]  polyethylene glycol (MIRALAX / GLYCOLAX) 17 g packet Take 17 g by mouth daily.   Yes [provider]    Allergies Patient has no allergy information on record.  No family history on file.  Social History    Review of Systems  Review of Systems  Unable to perform ROS: Mental status change      ____________________________________________   PHYSICAL EXAM:  VITAL  SIGNS: ED Triage Vitals [12/18/2020 1120]  Enc Vitals Group     BP      Pulse      Resp      Temp      Temp src      SpO2 95 %     Weight      Height      Head Circumference      Peak Flow      Pain Score      Pain Loc      Pain Edu?      Excl. in GC?    Vitals:   12/21/2020 1132 12/24/2020 1400  BP:  (!) 143/88  Pulse:  (!) 126  Resp:  (!) 28  Temp: (!) 101.5 F (38.6 C) 98.7 F (37.1 C)  SpO2:  92%   Physical Exam Vitals and nursing note reviewed.   Constitutional:      General: She is in acute distress.     Appearance: She is well-developed. She is ill-appearing.  HENT:     Head: Normocephalic and atraumatic.     Right Ear: External ear normal.     Left Ear: External ear normal.     Nose: Nose normal.     Mouth/Throat:     Mouth: Mucous membranes are dry.  Eyes:     Conjunctiva/sclera: Conjunctivae normal.  Cardiovascular:     Rate and Rhythm: Regular rhythm. Tachycardia present.     Heart sounds: No murmur heard.   Pulmonary:     Effort: Tachypnea and respiratory distress present.     Breath sounds: Examination of the right-lower field reveals rhonchi. Examination of the left-lower field reveals rhonchi. Decreased breath sounds and rhonchi present.  Abdominal:     Palpations: Abdomen is soft.     Tenderness: There is no abdominal tenderness. There is no right CVA tenderness or left CVA tenderness.  Musculoskeletal:     Cervical back: Neck supple.     Right lower leg: No edema.     Left lower leg: No edema.  Skin:    General: Skin is warm and dry.     Capillary Refill: Capillary refill takes more than 3 seconds.  Neurological:     Mental Status: She is lethargic, disoriented and confused.     Sacral wound with some dry necrotic tissue without purulence.  PERRLA.  EOMI.  Patient is able to give this examiner thumbs up with the right hand but does not follow any other commands.  She is able to mumble something incomprehensible but not able to answer any direct questions or provide any history.  There is a right ventral wall hernia that is easily reducible. ____________________________________________   LABS (all labs ordered are listed, but only abnormal results are displayed)  Labs Reviewed  LACTIC ACID, PLASMA - Abnormal; Notable for the following components:      Result Value   Lactic Acid, Venous 2.1 (*)    All other components within normal limits  LACTIC ACID, PLASMA - Abnormal; Notable for the following  components:   Lactic Acid, Venous 3.1 (*)    All other components within normal limits  COMPREHENSIVE METABOLIC PANEL - Abnormal; Notable for the following components:   Sodium 146 (*)    Chloride 112 (*)    CO2 20 (*)    Glucose, Bld 133 (*)    BUN 33 (*)    Total Protein 6.4 (*)    Albumin 2.6 (*)    Total Bilirubin  1.8 (*)    All other components within normal limits  CBC WITH DIFFERENTIAL/PLATELET - Abnormal; Notable for the following components:   WBC 17.3 (*)    RBC 3.54 (*)    Hemoglobin 10.9 (*)    HCT 32.7 (*)    Neutro Abs 16.1 (*)    Lymphs Abs 0.5 (*)    Abs Immature Granulocytes 0.10 (*)    All other components within normal limits  PROTIME-INR - Abnormal; Notable for the following components:   Prothrombin Time 15.5 (*)    All other components within normal limits  URINALYSIS, COMPLETE (UACMP) WITH MICROSCOPIC - Abnormal; Notable for the following components:   Color, Urine AMBER (*)    APPearance CLOUDY (*)    Hgb urine dipstick SMALL (*)    Ketones, ur 20 (*)    Protein, ur 100 (*)    Nitrite POSITIVE (*)    Leukocytes,Ua MODERATE (*)    WBC, UA >50 (*)    Bacteria, UA MANY (*)    Non Squamous Epithelial PRESENT (*)    All other components within normal limits  D-DIMER, QUANTITATIVE - Abnormal; Notable for the following components:   D-Dimer, Quant 12.09 (*)    All other components within normal limits  TROPONIN I (HIGH SENSITIVITY) - Abnormal; Notable for the following components:   Troponin I (High Sensitivity) 65 (*)    All other components within normal limits  TROPONIN I (HIGH SENSITIVITY) - Abnormal; Notable for the following components:   Troponin I (High Sensitivity) 64 (*)    All other components within normal limits  RESP PANEL BY RT-PCR (FLU A&B, COVID) ARPGX2  CULTURE, BLOOD (ROUTINE X 2)  CULTURE, BLOOD (ROUTINE X 2)  URINE CULTURE  APTT  PROCALCITONIN  MAGNESIUM  LACTIC ACID, PLASMA  LACTIC ACID, PLASMA    ____________________________________________  EKG  Sinus tachycardia with a ventricular of 132, normal axis, atrial premature complexes, unremarkable intervals and diffuse nonspecific changes throughout. 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  Chest x-ray has no clear focal consolidation, overt edema, pneumothorax, effusion or other clear acute thoracic process.   Official radiology report(s): CT Head Wo Contrast  Result Date: Feb 11, 2021 CLINICAL DATA:  Mental status changes of unknown cause. EXAM: CT HEAD WITHOUT CONTRAST TECHNIQUE: Contiguous axial images were obtained from the base of the skull through the vertex without intravenous contrast. COMPARISON:  None. FINDINGS: Brain: Age related atrophy. No focal abnormality affects the brainstem or cerebellum. 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 No cortical or large vessel territory infarction. No sign of acute infarction. No mass, hemorrhage, hydrocephalus or extra-axial collection. Vascular: There is atherosclerotic calcification of the major vessels at the base of the brain. Skull: Negative Sinuses/Orbits: Clear/normal Other: None IMPRESSION: Age related atrophy. 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 Chronic small-vessel changes of the white matter. No acute or reversible finding. Electronically Signed   By: Paulina Fusi M.D.   On: 2021/02/11 12:57   DG Chest Port 1 View  Result Date: 02/11/21 CLINICAL DATA:  Possible sepsis EXAM: PORTABLE CHEST 1 VIEW COMPARISON:  None. FINDINGS: Heart size within normal limits. Atherosclerosis and tortuosity of the aorta. No sign of pulmonary infiltrate, collapse or effusion. Chronic degenerative changes affect the spine and shoulders. IMPRESSION: No active process evident. Electronically  Signed   By: Scherrie Bateman.D.  On: 02-08-2021 12:00    ____________________________________________   PROCEDURES  Procedure(s) performed (including Critical Care):  .Critical Care Performed by: Gilles ChiquitoSmith, Lorrane Mccay P, MD Authorized by: Gilles ChiquitoSmith, Belina Mandile P, MD   Critical care provider statement:    Critical care time (minutes):  45   Critical care time was exclusive of:  Separately billable procedures and treating other patients   Critical care was necessary to treat or prevent imminent or life-threatening deterioration of the following conditions:  Respiratory failure and sepsis   Critical care was time spent personally by me on the following activities:  Discussions with consultants, evaluation of patient's response to treatment, examination of patient, ordering and performing treatments and interventions, ordering and review of laboratory studies, ordering and review of radiographic studies, pulse oximetry, re-evaluation of patient's condition, obtaining history from patient or surrogate and review of old charts     ____________________________________________   INITIAL IMPRESSION / ASSESSMENT AND PLAN / ED COURSE      Patient presents with above to history exam for assessment of altered mental status fever and hypoxia noted by staff this morning at her SNF.  She was reportedly hypoxic in the 80s on room air on EMS arrival and was transported on nonrebreather.  She otherwise noted to be febrile with EMS and tachycardic and tachypneic although normotensive.  She did receive 500 cc of normal saline via EMS.  On arrival she is hypoxic on room air at 87% which improved to 84% on 12 L via bubbler.  She is tachycardic at 132, tachypneic at 24 with a BP of 104/67 and febrile.  She is able to give examiner thumbs up in her right hand but unable to follow any other commands without any additional history.  I did attempt to reach family listed in the chart as well as staff member at [facility] but  family phone number went to voicemail and I was transferred twice when attempting to reach staff encompass both times and got voicemail.  Overall I am concerned for acute encephalopathy and hypoxic respiratory failure secondary to sepsis without clear source on arrival.  Possible sources include respiratory given hypoxia, urinary given Foley cath.  Reported history of recent UTI and possible cellulitis.  Sacral wound with some necrotic tissue noted as depicted above photo.  Low suspicion for recent fall no history of this or obvious findings of trauma on exam.  Low suspicion for toxic ingestion.  Given findings more consistent with sepsis I will lower suspicion for CVA although will obtain CT head as well.  CMP remarkable for hyponatremia with a 146, bicarb of 20, without any other significant electrolyte or metabolic derangements.  CBC with WBC count of 17.3, hemoglobin of 10.9 and normal platelets.  UA obtained after Foley replaced remarkable for small hemoglobin, 20 ketones, 100 protein and positive nitrites, moderate LE S greater than 50 WBCs and many bacteria.  Initial troponin elevated 65.  Suspect demand ischemia in the setting of sepsis although will give ASA and plan to trend.  Magnesium is unremarkable.  Will send D-dimer given hypoxic respiratory failure without evidence of heart failure or clear pneumonia on chest x-ray.  Patient treated with broad-spectrum antibiotics and IV fluids with concerns for sepsis.  Blood and urine cultures obtained.  Initial lactic acid elevated 2.1.  D-dimer is elevated 12.09 given hypoxic respiratory failure without clear pneumonia on chest x-ray will obtain CTA chest to rule out PE.  Also obtain abdomen pelvis as patient is medical history and certainly possible stressors  or infection her abdomen.  Repeat lactic acid did trend up to 3.1.  Patient is undergoing ongoing fluid resuscitation.  Repeat troponin is stable at 64.  Was able to speak with patient's daughter  eventually who did come to bedside.  Patient's daughter also spoke with ICU attending and after long discussion patient was made DNR.  Explained that patient is likely dying and daughter is understanding and aware of this fact and is considering physician to comfort care although we will maintain DNR at this time.  Did receive a call from radiology at approximately 1535 and was informed that patient has multiple small subsegmental PEs on CTA chest.  Heparin order placed.  I will admit to medicine service for further evaluation and management.   ____________________________________________   FINAL CLINICAL IMPRESSION(S) / ED DIAGNOSES  Final diagnoses:  Acute respiratory failure with hypoxia (HCC)  Sepsis, due to unspecified organism, unspecified whether acute organ dysfunction present [facility])  Urinary tract infection associated with indwelling urethral catheter, initial encounter (HCC)  Wound of sacral region, initial encounter  Altered mental status, unspecified altered mental status type  Positive D dimer  Troponin I above reference range  Lactic acidosis  Goals of care, counseling/discussion  Multiple subsegmental pulmonary emboli without acute cor pulmonale (HCC)    Medications  lactated ringers infusion (has no administration in time range)  lactated ringers bolus 1,000 mL (0 mLs Intravenous Stopped 01/05/2021 1312)    And  lactated ringers bolus 500 mL (0 mLs Intravenous Stopped 01/11/2021 1448)  acetaminophen (TYLENOL) suppository 650 mg (650 mg Rectal Given 12/17/2020 1135)  ceFEPIme (MAXIPIME) 2 g in sodium chloride 0.9 % 100 mL IVPB (0 g Intravenous Stopping Infusion hung by another clincian 12/27/2020 1328)  vancomycin (VANCOCIN) IVPB 1000 mg/200 mL premix (0 mg Intravenous Stopped 12/23/2020 1329)  aspirin suppository 300 mg (300 mg Rectal Given 01/03/2021 1333)  iohexol (OMNIPAQUE) 350 MG/ML injection 50 mL (50 mLs Intravenous Contrast Given 12/22/2020 1421)     ED Discharge Orders     None       Note:  This document was prepared using Dragon voice recognition software and may include unintentional dictation errors.   Gilles Chiquito, MD 12/19/2020 1535    Gilles Chiquito, MD 01/15/2021 [phone]

## 2021-01-12 NOTE — ED Notes (Signed)
Jennifer RN aware of assigned bed 

## 2021-01-12 NOTE — ED Notes (Signed)
Dr Michiel Sites notified of lactate of 3.1

## 2021-01-12 NOTE — ED Triage Notes (Signed)
Pt from [facility], staff report altered mental status first observed this morning and hypoxia with room sat of 81%, temp of 102. EMS placed pt on nonrebreather at 10LPM and patient's oxygen sat was 95%. Patient recently treated for uti and completed antibiotics, and presents with indwelling urinary catheter placed for management of retention and kidney failure. Patient reponds to sternal rub, no verbal response upon arrival to ED. Placed on nasal cannula with oxygen sat at 92%.

## 2021-01-12 NOTE — Consult Note (Signed)
CRITICAL CARE PROGRESS NOTE    Name: Emily Murillo MRN: [id number] DOB: Dec 07, 1940     LOS: 0  Referring physician: Dr Katrinka Blazing  SUBJECTIVE FINDINGS & SIGNIFICANT EVENTS    Patient description:   80 yo F from SNF with altered mental status confusion malaise remote UTI s/p full course therapy.  Has indwelling foley on outpatient chronically.   I spoke to [employer], she states that patient was in [place] with dehydration and SBO partial.  She was treated for UTI but had severe incontinence so she continued foley.    Patient has severe scoliosis and spinal stenosis with foot drop on left and was able to stand with assistance only.   She has been noted to become weaker and declined physically with severe deconditioning.      Daughter also shares patient developed tremors in hands, dysphagia and speech difficulty since March 2022.  Daughter thinks she may have parkinsons developing.   Additionally patient has been diagnosed with schizoaffective disorder AT [facility] geriatric psychiatric unit.     Now it seems that she has voice and speech difficulty.    After prolonged goals of care discourse patient was made DNR/DNI by daughter Coleen who is POA  Lines/tubes : Urethral Catheter Jessie, NT Temperature probe 16 Fr. (Active)  Indication for Insertion or Continuance of Catheter Healing of stage 3 or 4 pressure injuries AND incontinence;Chronic catheter use 01/02/2021 1155  Site Assessment Clean;Intact 01/05/2021 1155  Catheter Maintenance Bag below level of bladder;Catheter secured;Drainage bag/tubing not touching floor;Insertion date on drainage bag;Seal intact;No dependent loops;Bag emptied prior to transport 12/27/2020 1155  Collection Container Standard drainage bag 01/09/2021 1155  Securement Method Tape 01/06/2021 1155   Input (mL) 10 mL 12/23/2020 1155    Microbiology/Sepsis markers: No results found for this or any previous visit.  Anti-infectives:  Anti-infectives (From admission, onward)   Start     Dose/Rate Route Frequency Ordered Stop   12/18/2020 1200  ceFEPIme (MAXIPIME) 2 g in sodium chloride 0.9 % 100 mL IVPB        2 g 200 mL/hr over 30 Minutes Intravenous  Once 01/05/2021 1146     12/23/2020 1200  vancomycin (VANCOCIN) IVPB 1000 mg/200 mL premix        1,000 mg 200 mL/hr over 60 Minutes Intravenous  Once 12/25/2020 1146         PAST MEDICAL HISTORY   No past medical history on file.   SURGICAL HISTORY   Shernia repair - 2005   - SBO -   - sigmoid colon resection-1999   - histerectomy - 1999  FAMILY HISTORY   No family history on file.   SOCIAL HISTORY       MEDICATIONS   Current Medication:  Current Facility-Administered Medications:  .  ceFEPIme (MAXIPIME) 2 g in sodium chloride 0.9 % 100 mL IVPB, 2 g, Intravenous, Once, Tressie Ellis, RPH .  [COMPLETED] lactated ringers bolus 1,000 mL, 1,000 mL, Intravenous, Once, Last Rate: 2,000 mL/hr at 01/01/2021 1151, 1,000 mL at 12/23/2020 1151 **AND** lactated ringers bolus 500 mL, 500 mL, Intravenous, Once, Gilles Chiquito, MD .  lactated ringers infusion, , Intravenous, Continuous, Gilles Chiquito, MD .  vancomycin (VANCOCIN) IVPB 1000 mg/200 mL premix, 1,000 mg, Intravenous, Once, Tressie Ellis, RPH, Last Rate: 200 mL/hr at 01/11/2021 1213, 1,000 mg at 01/16/2021 1213 No current outpatient medications on file.    ALLERGIES   Patient has no allergy information on record.    REVIEW  OF SYSTEMS     Unable to obtain due to confusion  PHYSICAL EXAMINATION   Vital Signs: Temp:  [101.5 F (38.6 C)] 101.5 F (38.6 C) (05/27 1132) Pulse Rate:  [133-137] 133 (05/27 1130) Resp:  [24-31] 31 (05/27 1130) BP: (100-104)/(62-67) 100/62 (05/27 1130) SpO2:  [92 %-95 %] 93 % (05/27 1130) Weight:  [45.4 kg] 45.4 kg (05/27  1128)  GENERAL:age appropirate , acutely ill appering HEAD: Normocephalic, atraumatic.  EYES: Pupils equal, round, reactive to light.  No scleral icterus.  MOUTH:  DRY MUCOSA WITH CRUSTING AND MUCOMUPURULENT LESIONS OF MOUTH  NECK: Supple. No thyromegaly. No nodules. No JVD.  PULMONARY: CRACKLES AND RHONCHI B/L CARDIOVASCULAR: S1 and S2. Regular rate and rhythm. No murmurs, rubs, or gallops.  GASTROINTESTINAL: Soft, nontender, non-distended. No masses. Positive bowel sounds. No hepatosplenomegaly. +SACRAL WOUND  MUSCULOSKELETAL: No swelling, clubbing, or edema. CONTRACTED BILATERALLY  NEUROLOGIC: POORLY RESPONSIVE SKIN:intact,warm,dry   PERTINENT DATA     Infusions: . ceFEPime (MAXIPIME) IV    . lactated ringers    . lactated ringers    . vancomycin 1,000 mg (01/04/2021 1213)   Scheduled Medications:  PRN Medications:  Hemodynamic parameters:   Intake/Output: No intake/output data recorded.  Ventilator  Settings:     LAB RESULTS:  Basic Metabolic Panel: Recent Labs  Lab 12/23/2020 1134  NA 146*  K 3.6  CL 112*  CO2 20*  GLUCOSE 133*  BUN 33*  CREATININE 0.56  CALCIUM 9.5  MG 2.2   Liver Function Tests: Recent Labs  Lab 12/20/2020 1134  AST 18  ALT 13  ALKPHOS 57  BILITOT 1.8*  PROT 6.4*  ALBUMIN 2.6*   No results for input(s): LIPASE, AMYLASE in the last 168 hours. No results for input(s): AMMONIA in the last 168 hours. CBC: Recent Labs  Lab 01/14/2021 1134  WBC 17.3*  NEUTROABS 16.1*  HGB 10.9*  HCT 32.7*  MCV 92.4  PLT 174   Cardiac Enzymes: No results for input(s): CKTOTAL, CKMB, CKMBINDEX, TROPONINI in the last 168 hours. BNP: Invalid input(s): POCBNP CBG: No results for input(s): GLUCAP in the last 168 hours.     IMAGING RESULTS:  Imaging: DG Chest Port 1 View  Result Date: 01/07/2021 CLINICAL DATA:  Possible sepsis EXAM: PORTABLE CHEST 1 VIEW COMPARISON:  None. FINDINGS: Heart size within normal limits. Atherosclerosis and  tortuosity of the aorta. No sign of pulmonary infiltrate, collapse or effusion. Chronic degenerative changes affect the spine and shoulders. IMPRESSION: No active process evident. Electronically Signed   By: Paulina Fusi M.D.   On: 01/10/2021 12:00   @PROBHOSP @ DG Chest Port 1 View  Result Date: 01/09/2021 CLINICAL DATA:  Possible sepsis EXAM: PORTABLE CHEST 1 VIEW COMPARISON:  None. FINDINGS: Heart size within normal limits. Atherosclerosis and tortuosity of the aorta. No sign of pulmonary infiltrate, collapse or effusion. Chronic degenerative changes affect the spine and shoulders. IMPRESSION: No active process evident. Electronically Signed   By: 01/14/2021 M.D.   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  outpatient neurology evaluation    - ID -continue IV abx as prescibed -follow up cultures  GI/Nutrition GI PROPHYLAXIS as indicated DIET-->TF's as tolerated Constipation protocol as indicated  ENDO - ICU hypoglycemic\Hyperglycemia protocol -check FSBS per protocol   ELECTROLYTES -follow labs as needed -replace as needed -pharmacy  consultation   DVT/GI PRX ordered -SCDs  TRANSFUSIONS AS NEEDED MONITOR FSBS ASSESS the need for LABS as needed   Critical care provider statement:    Critical care time (minutes):  108   Critical care time was exclusive of:  Separately billable procedures and treating other patients   Critical care was necessary to treat or prevent imminent or life-threatening deterioration of the following conditions:  Sepsis, UTI, aphasia, altered mental status, multiple comorbid conditions.    Critical care was time spent personally by me on the following activities:  Development of treatment plan with patient or surrogate, discussions with consultants, evaluation of patient's response to treatment, examination of patient, obtaining history from patient or surrogate, ordering and performing treatments and interventions, ordering and review of laboratory studies and re-evaluation of patient's condition.  I assumed direction of critical care for this patient from another provider in my specialty: no    This document was prepared using Dragon voice recognition software and may include unintentional dictation errors.    Vida Rigger, M.D.  Division of Pulmonary & Critical Care Medicine  [facility]

## 2021-01-12 NOTE — ED Notes (Signed)
Dr Para March notified via secure chat of hr in 130s

## 2021-01-12 NOTE — Progress Notes (Signed)
CODE SEPSIS - PHARMACY COMMUNICATION  **Broad Spectrum Antibiotics should be administered within 1 hour of Sepsis diagnosis**  Time Code Sepsis Called/Page Received: 1134  Antibiotics Ordered: cefepime, vanc  Time of 1st antibiotic administration: 1213  Additional action taken by pharmacy:    If necessary, Name of Provider/Nurse Contacted:      Angelique Blonder ,PharmD Clinical Pharmacist  Jan 27, 2021  12:32 PM

## 2021-01-12 NOTE — Consult Note (Signed)
PHARMACY -  BRIEF ANTIBIOTIC NOTE   Pharmacy has received consult(s) for cefepime and vancomycin from an ED provider.  The patient's profile has been reviewed for ht/wt/allergies/indication/available labs.    Baseline labs are pending. Allergies not yet on file. Do see recent Rx for penicillin dispensed 10/16/20.  One time order(s) placed for --Vancomycin 1 g IV (22 mg/kg) --Cefepime 2 g IV  Further antibiotics/pharmacy consults should be ordered by admitting physician if indicated.                       Thank you, Tressie Ellis 01/16/2021  11:44 AM

## 2021-01-12 NOTE — Progress Notes (Signed)
[facility] ED11 [facility] Hospital Liaison note:  This is a pending outpatient-based Palliative Care patient. Will continue to follow for disposition.  Please call with any outpatient palliative questions or concerns.  Thank you, Dee Curry, LPN [facility] Hospital Liaison [phone] 

## 2021-01-12 NOTE — Progress Notes (Signed)
End of life care

## 2021-01-12 NOTE — Consult Note (Addendum)
Consultation Note Date: 12/31/2020   Patient Name: Emily Murillo  DOB: 13-Aug-1941  MRN: [id number]  Age / Sex: 80 y.o., female  PCP: Alvester Morin, MD Referring Physician: Collier Bullock, MD  Reason for Consultation: Establishing goals of care  HPI/Patient Profile: 80 yo F from SNF with altered mental status confusion malaise remote UTI s/p full course therapy.  Has indwelling foley on outpatient chronically.   Clinical Assessment and Goals of Care: Patient is resting in bed with eyes open staring at ceiling. Daughter who is HPOA Coleen is at bedside. Confirmed HPOA as she showed me the document. ED registration is currently uploading HPOA form into documents.  She states her mother had been in assisted living and then was admitted to [facility]. We discussed her admission there. From there she went to SNF, has declined, and is weak and frail.   We discussed her diagnosis, prognosis, GOC, EOL wishes disposition and options.  A detailed discussion was had today regarding advanced directives.  Concepts specific to code status, artifical feeding and hydration, IV antibiotics and rehospitalization were discussed.  The difference between an aggressive medical intervention path and a comfort care path was discussed.  Values and goals of care important to patient and family were attempted to be elicited.  Discussed limitations of medical interventions to prolong quality of life in some situations and discussed the concept of human mortality.  She states her mother told her she was ready to die. She states she would like to support her mother's wishes. She states her mother is a woman of faith. We discussed a focus on comfort and dignity for what time she has left. We discussed no further life prolonging measures. Daughter agrees with this plan. She states her siblings are all on their way here.    We discussed  currently pending hospital admission. We discussed hospice facility placement near [facility] where daughter lives. Daughter states she and her family are not prepared to take the chance of her dying in an ambulance. They do not want her to suffer. She does not appear stable to transfer without a reasonable risk of death during transport.   Patient looks at me but does not speak.   Admitting MD Agbata into conversation. Discussed plans for admission and comfort care with anticipated hospital death.   I completed a MOST form today with daughter and the signed original was placed in the chart. A photocopy was placed in the chart to be scanned into EMR. The patient outlined their wishes for the following treatment decisions:  Cardiopulmonary Resuscitation: Do Not Attempt Resuscitation (DNR/No CPR)  Medical Interventions: Comfort Measures: Keep clean, warm, and dry. Use medication by any route, positioning, wound care, and other measures to relieve pain and suffering. Use oxygen, suction and manual treatment of airway obstruction as needed for comfort. Do not transfer to the hospital unless comfort needs cannot be met in current location.  Antibiotics: No antibiotics (use other measures to relieve symptoms)  IV Fluids: No IV fluids (provide other measures to ensure  comfort)  Feeding Tube: No feeding tube      SUMMARY OF RECOMMENDATIONS   Admit for comfort care and hospital death.   Prognosis:   Hours - Days      Primary Diagnoses: Present on Admission: . Sepsis ([place])   I have reviewed the medical record, interviewed the patient and family, and examined the patient. The following aspects are pertinent.  History reviewed. No pertinent past medical history. Social History   Socioeconomic History  . Marital status: Divorced    Spouse name: Not on file  . Number of children: Not on file  . Years of education: Not on file  . Highest education level: Not on file  Occupational History  . Not  on file  Tobacco Use  . Smoking status: Not on file  . Smokeless tobacco: Not on file  Substance and Sexual Activity  . Alcohol use: Not on file  . Drug use: Not on file  . Sexual activity: Not on file  Other Topics Concern  . Not on file  Social History Narrative  . Not on file   Social Determinants of Health   Financial Resource Strain: Not on file  Food Insecurity: Not on file  Transportation Needs: Not on file  Physical Activity: Not on file  Stress: Not on file  Social Connections: Not on file   No family history on file. Scheduled Meds: . heparin  3,100 Units Intravenous Once   Continuous Infusions: . heparin    . lactated ringers     PRN Meds:. Medications Prior to Admission:  Prior to Admission medications   Medication Sig Start Date End Date Taking? Authorizing Provider  acetaminophen (TYLENOL) 325 MG tablet Take 650 mg by mouth 3 (three) times daily.   Yes [provider]  alum & mag hydroxide-simeth (MAALOX/MYLANTA) 200-200-20 MG/5ML suspension Take 15 mLs by mouth in the morning, at noon, and at bedtime.   Yes [provider]  amLODipine (NORVASC) 5 MG tablet Take 5 mg by mouth daily.   Yes [provider]  ascorbic acid (VITAMIN C) 500 MG tablet Take 500 mg by mouth daily.   Yes [provider]  ASHWAGANDHA PO Take 800 mg by mouth 2 (two) times daily.   Yes [provider]  bisacodyl (DULCOLAX) 5 MG EC tablet Take 5 mg by mouth every Monday, Wednesday, and Friday.   Yes [provider]  carboxymethylcellulose 1 % ophthalmic solution Place 2 drops into both eyes every 2 (two) hours as needed (dry eyes).   Yes [provider]  cholecalciferol (VITAMIN D3) 25 MCG (1000 UNIT) tablet Take 2,000 Units by mouth daily.   Yes [provider]  cromolyn (NASALCROM) 5.2 MG/ACT nasal spray Place 1 spray into both nostrils in the morning and at bedtime.   Yes [provider]  diclofenac Sodium  (VOLTAREN) 1 % GEL Apply 1 g topically 4 (four) times daily.   Yes [provider]  docusate sodium (COLACE) 100 MG capsule Take 100 mg by mouth 2 (two) times daily.   Yes [provider]  Folic Acid-Vit Z6-XWR U04 (FOLTABS 800) 0.8-10-0.115 MG TABS Take 1 tablet by mouth every evening.   Yes [provider]  gabapentin (NEURONTIN) 100 MG capsule Take 100 mg by mouth at bedtime.   Yes [provider]  glycerin adult 2 g suppository Place 1 suppository rectally daily as needed for constipation.   Yes [provider]  ketoconazole (NIZORAL) 2 % shampoo Apply 1  application topically 2 (two) times a week. (Monday and Thursday)   Yes [provider]  lidocaine (LMX) 4 % cream Apply 1 application topically 3 (three) times daily as needed (pain). (low back pain)   Yes [provider]  magnesium oxide (MAG-OX) 400 MG tablet Take 400 mg by mouth daily.   Yes [provider]  Misc Natural Product Nasal (PONARIS NA) Place 1 spray into both nostrils in the morning and at bedtime.   Yes [provider]  paliperidone (INVEGA SUSTENNA) 156 MG/ML SUSY injection Inject 156 mg into the muscle once.   Yes [provider]  polyethylene glycol (MIRALAX / GLYCOLAX) 17 g packet Take 17 g by mouth daily.   Yes [provider]  potassium chloride SA (KLOR-CON) 20 MEQ tablet Take 20 mEq by mouth daily.   Yes [provider]  senna (SENOKOT) 8.6 MG TABS tablet Take 1 tablet by mouth 2 (two) times daily.   Yes [provider]  sertraline (ZOLOFT) 100 MG tablet Take 100 mg by mouth daily.   Yes [provider]  zinc sulfate 220 (50 Zn) MG capsule Take 220 mg by mouth daily.   Yes [provider]   Allergies  Allergen Reactions  . Amitiza [Lubiprostone]   . Aspirin   . Baclofen   . Bismuth-Containing Compounds   . Carbamazepine   . Ciprofloxacin   . Escitalopram   . Hydrochlorothiazide    . Prednisone   . Raloxifene   . Shellfish Allergy   . Wellbutrin [Bupropion]    Review of Systems  Unable to perform ROS   Physical Exam Pulmonary:     Effort: Pulmonary effort is normal.  Skin:    General: Skin is warm and dry.  Neurological:     Mental Status: She is alert.     Vital Signs: BP (!) 143/88   Pulse (!) 126   Temp 98.7 F (37.1 C)   Resp (!) 28   Wt 45.4 kg   SpO2 92%  Pain Scale: Faces       SpO2: SpO2: 92 % O2 Device:SpO2: 92 % O2 Flow Rate: .O2 Flow Rate (L/min): 15 L/min  IO: Intake/output summary:   Intake/Output Summary (Last 24 hours) at 01/02/2021 1553 Last data filed at 12/22/2020 1448 Gross per 24 hour  Intake 510 ml  Output --  Net 510 ml    LBM:   Baseline Weight: Weight: 45.4 kg Most recent weight: Weight: 45.4 kg       Time In: 3:15 Time Out: 4:05 Time Total: 50 min Greater than 50%  of this time was spent counseling and coordinating care related to the above assessment and plan.  Signed by: Asencion Gowda, NP   Please contact Palliative Medicine Team phone at [phone] for questions and concerns.  For individual provider: See Shea Evans

## 2021-01-12 NOTE — Telephone Encounter (Signed)
Pt being sent to [facility] ED upon my arrival to assess for Palliative services. She was min. Responsive with tachycardia.

## 2021-01-12 NOTE — ED Notes (Signed)
Pt to CT

## 2021-01-13 ENCOUNTER — Other Ambulatory Visit: Payer: Self-pay

## 2021-01-13 DIAGNOSIS — R6520 Severe sepsis without septic shock: Secondary | ICD-10-CM | POA: Diagnosis not present

## 2021-01-13 DIAGNOSIS — A419 Sepsis, unspecified organism: Secondary | ICD-10-CM | POA: Diagnosis not present

## 2021-01-13 DIAGNOSIS — J9600 Acute respiratory failure, unspecified whether with hypoxia or hypercapnia: Secondary | ICD-10-CM

## 2021-01-13 DIAGNOSIS — Z515 Encounter for palliative care: Secondary | ICD-10-CM

## 2021-01-13 MED ORDER — SCOPOLAMINE 1 MG/3DAYS TD PT72
1.0000 | MEDICATED_PATCH | TRANSDERMAL | Status: DC
  Administered 2021-01-13: 13:00:00 1.5 mg via TRANSDERMAL
  Filled 2021-01-13: qty 1

## 2021-01-15 LAB — URINE CULTURE: Culture: >=100000 — AB

## 2021-01-17 LAB — CULTURE, BLOOD (ROUTINE X 2)
Culture: NO GROWTH
Culture: NO GROWTH

## 2021-01-17 NOTE — Discharge Summary (Signed)
Death Summary  Cassanda Walmer ELF:[id number] DOB: 1941-03-21 DOA: Feb 02, 2021  PCP: Keane Police, MD  Admit date: 02/02/2021 Date of Death: 2021/02/03 Time of Death: 12/25/1613 Notification: Keane Police, MD notified of death of 02/04/21   History of present illness:  Latanga Nedrow an 80 y.o.femalewith medical history significant forschizoaffective disorder, severe scoliosis and spinal stenosis with left foot drop, urinary incontinence with chronic indwelling Foley catheter, recurrent UTIswho was sent to the emergency room for evaluation of change in mental status, hypoxia, and a fever with a temp of 102 F. She was placed on a nonrebreather mask@ [facility] with improvement in her pulse ox to95% and was transported to the ER for further evaluation.  Upon arrival to the ER she was noted to be very lethargic and responding only to painful stimuli.  General work-up was consistent with severe sepsis due to UTI and pneumonia.  Patient also had known decubitus ulcers. Case was discussed with patient's daughter on admission and given poor prognosis, decisions were made to pursue comfort care with in hospital expected death. Patient was transitioned to comfort care after this conversation and she passed naturally on 02/03/2021 at 1615.  Final Diagnoses:  Severe sepsis due to pneumonia and CAUTI, POA   The results of significant diagnostics from this hospitalization (including imaging, microbiology, ancillary and laboratory) are listed below for reference.    Significant Diagnostic Studies: CT Head Wo Contrast  Result Date: 02-02-21 CLINICAL DATA:  Mental status changes of unknown cause. EXAM: CT HEAD WITHOUT CONTRAST TECHNIQUE: Contiguous axial images were obtained from the base of the skull through the vertex without intravenous contrast. COMPARISON:  None. FINDINGS: Brain: Age related atrophy. No focal abnormality affects the brainstem or cerebellum. 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 No cortical or large vessel territory infarction. No sign of acute infarction. No mass, hemorrhage, hydrocephalus or extra-axial collection. Vascular: There is atherosclerotic calcification of the major vessels at the base of the brain. Skull: Negative Sinuses/Orbits: Clear/normal Other: None IMPRESSION: Age related atrophy. 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 Chronic small-vessel changes of the white matter. No acute or reversible finding. Electronically Signed   By: Paulina Fusi M.D.   On: Feb 02, 2021 12:57   CT Angio Chest PE W and/or Wo Contrast  Result Date: 02-02-2021 CLINICAL DATA:  Abdominal pain, fever. EXAM: CT ANGIOGRAPHY CHEST CT ABDOMEN AND PELVIS WITH CONTRAST TECHNIQUE: Multidetector CT imaging of the chest was performed using the standard protocol during bolus administration of intravenous contrast. Multiplanar CT image reconstructions and MIPs were obtained to evaluate the vascular anatomy. Multidetector CT imaging of the abdomen and pelvis was performed using the standard protocol during bolus administration of intravenous contrast. CONTRAST:  47mL OMNIPAQUE IOHEXOL 350 MG/ML SOLN COMPARISON:  None. FINDINGS: CTA CHEST FINDINGS Cardiovascular: Multiple segmental or smaller filling defects are seen involving the upper and lower lobe branches of the right pulmonary artery. Smaller peripheral defects are noted involving the upper lobe branches of the left pulmonary artery. Mild cardiomegaly. No pericardial effusion. Coronary artery calcifications are noted. Atherosclerosis of thoracic aorta is noted without aneurysm or dissection. Mediastinum/Nodes: 4 cm left thyroid nodule is noted. No adenopathy is noted. Esophagus is unremarkable. Lungs/Pleura: No pneumothorax is noted. Bilateral lower lobe airspace opacities are noted concerning for pneumonia or atelectasis. Musculoskeletal: No chest  wall abnormality. No acute or significant osseous findings. Review of the MIP images confirms the above findings. CT ABDOMEN and PELVIS  FINDINGS Hepatobiliary: No focal liver abnormality is seen. No gallstones, gallbladder wall thickening, or biliary dilatation. Pancreas: Unremarkable. No pancreatic ductal dilatation or surrounding inflammatory changes. Spleen: Normal in size without focal abnormality. Adrenals/Urinary Tract: Adrenal glands and kidneys appear normal. Foley catheter is noted in urinary bladder. Stomach/Bowel: Stomach is unremarkable. Stool is noted in the colon. No abnormal small bowel dilatation is noted. Moderately dilated sigmoid colon and rectum is noted with wall thickening concerning for infection or inflammation. There is seen inflammatory stranding around this portion of the bowel. Vascular/Lymphatic: Aortic atherosclerosis. No enlarged abdominal or pelvic lymph nodes. Reproductive: Uterus is not clearly identified and presumably patient is status post hysterectomy. No adnexal abnormality is noted. Other: Minimal ascites is noted. Small to moderate size hernia is noted in the right lower quadrant which appears to contain a loop of bowel, but does not result in definite obstruction. Musculoskeletal: Multilevel degenerative disc disease is noted in the lumbar spine. No acute osseous abnormality is noted. Review of the MIP images confirms the above findings. IMPRESSION: Bilateral pulmonary emboli are noted. Critical Value/emergent results were called by telephone at the time of interpretation on 01-23-21 at 3:38 pm to provider [facility] , who verbally acknowledged these results. Large bilateral lower lobe airspace opacities are noted concerning for pneumonia or atelectasis. 4 cm left thyroid nodule is noted. Recommend thyroid US. (Ref: J Am Coll Radiol. 2015 Feb;12(2): 143-50). Moderately dilated distal sigmoid colon and rectum is noted with wall thickening and surrounding inflammation  concerning for infectious or inflammatory colitis. Postsurgical changes are seen involving the proximal sigmoid colon. Small to moderate size hernia is noted in right lower quadrant which appears to contain a loop of bowel, but does not appear to be resulting in definite obstruction. Minimal ascites. Coronary artery calcifications are noted. Aortic Atherosclerosis (ICD10-I70.0). Electronically Signed   By: Lupita Raider M.D.   On: 01-23-2021 15:39   CT ABDOMEN PELVIS W CONTRAST  Result Date: January 23, 2021 CLINICAL DATA:  Abdominal pain, fever. EXAM: CT ANGIOGRAPHY CHEST CT ABDOMEN AND PELVIS WITH CONTRAST TECHNIQUE: Multidetector CT imaging of the chest was performed using the standard protocol during bolus administration of intravenous contrast. Multiplanar CT image reconstructions and MIPs were obtained to evaluate the vascular anatomy. Multidetector CT imaging of the abdomen and pelvis was performed using the standard protocol during bolus administration of intravenous contrast. CONTRAST:  50mL OMNIPAQUE IOHEXOL 350 MG/ML SOLN COMPARISON:  None. FINDINGS: CTA CHEST FINDINGS Cardiovascular: Multiple segmental or smaller filling defects are seen involving the upper and lower lobe branches of the right pulmonary artery. Smaller peripheral defects are noted involving the upper lobe branches of the left pulmonary artery. Mild cardiomegaly. No pericardial effusion. Coronary artery calcifications are noted. Atherosclerosis of thoracic aorta is noted without aneurysm or dissection. Mediastinum/Nodes: 4 cm left thyroid nodule is noted. No adenopathy is noted. Esophagus is unremarkable. Lungs/Pleura: No pneumothorax is noted. Bilateral lower lobe airspace opacities are noted concerning for pneumonia or atelectasis. Musculoskeletal: No chest wall abnormality. No acute or significant osseous findings. Review of the MIP images confirms the above findings. CT ABDOMEN and PELVIS FINDINGS Hepatobiliary: No focal liver  abnormality is seen. No gallstones, gallbladder wall thickening, or biliary dilatation. Pancreas: Unremarkable. No pancreatic ductal dilatation or surrounding inflammatory changes. Spleen: Normal in size without focal abnormality. Adrenals/Urinary Tract: Adrenal glands and kidneys appear normal. Foley catheter is noted in urinary bladder. Stomach/Bowel: Stomach is unremarkable. Stool is noted in the colon. No abnormal small bowel dilatation is noted. Moderately  dilated sigmoid colon and rectum is noted with wall thickening concerning for infection or inflammation. There is seen inflammatory stranding around this portion of the bowel. Vascular/Lymphatic: Aortic atherosclerosis. No enlarged abdominal or pelvic lymph nodes. Reproductive: Uterus is not clearly identified and presumably patient is status post hysterectomy. No adnexal abnormality is noted. Other: Minimal ascites is noted. Small to moderate size hernia is noted in the right lower quadrant which appears to contain a loop of bowel, but does not result in definite obstruction. Musculoskeletal: Multilevel degenerative disc disease is noted in the lumbar spine. No acute osseous abnormality is noted. Review of the MIP images confirms the above findings. IMPRESSION: Bilateral pulmonary emboli are noted. Critical Value/emergent results were called by telephone at the time of interpretation on 01/02/2021 at 3:38 pm to provider [facility] , who verbally acknowledged these results. Large bilateral lower lobe airspace opacities are noted concerning for pneumonia or atelectasis. 4 cm left thyroid nodule is noted. Recommend thyroid US. (Ref: J Am Coll Radiol. 2015 Feb;12(2): 143-50). Moderately dilated distal sigmoid colon and rectum is noted with wall thickening and surrounding inflammation concerning for infectious or inflammatory colitis. Postsurgical changes are seen involving the proximal sigmoid colon. Small to moderate size hernia is noted in right lower  quadrant which appears to contain a loop of bowel, but does not appear to be resulting in definite obstruction. Minimal ascites. Coronary artery calcifications are noted. Aortic Atherosclerosis (ICD10-I70.0). Electronically Signed   By: Lupita Raider M.D.   On: 12/28/2020 15:39   DG Chest Port 1 View  Result Date: 01/11/2021 CLINICAL DATA:  Possible sepsis EXAM: PORTABLE CHEST 1 VIEW COMPARISON:  None. FINDINGS: Heart size within normal limits. Atherosclerosis and tortuosity of the aorta. No sign of pulmonary infiltrate, collapse or effusion. Chronic degenerative changes affect the spine and shoulders. IMPRESSION: No active process evident. Electronically Signed   By: Paulina Fusi M.D.   On: 01/03/2021 12:00    Microbiology: Recent Results (from the past 240 hour(s))  Resp Panel by RT-PCR (Flu A&B, Covid) Nasopharyngeal Swab     Status: None   Collection Time: 12/28/2020 11:34 AM   Specimen: Nasopharyngeal Swab; Nasopharyngeal(NP) swabs in vial transport medium  Result Value Ref Range Status   SARS Coronavirus 2 by RT PCR NEGATIVE NEGATIVE Final    Comment: (NOTE) SARS-CoV-2 target nucleic acids are NOT DETECTED.  The SARS-CoV-2 RNA is generally detectable in upper respiratory specimens during the acute phase of infection. The lowest concentration of SARS-CoV-2 viral copies this assay can detect is 138 copies/mL. A negative result does not preclude SARS-Cov-2 infection and should not be used as the sole basis for treatment or other patient management decisions. A negative result may occur with  improper specimen collection/handling, submission of specimen other than nasopharyngeal swab, presence of viral mutation(s) within the areas targeted by this assay, and inadequate number of viral copies(<138 copies/mL). A negative result must be combined with clinical observations, patient history, and epidemiological information. The expected result is Negative.  Fact Sheet for Patients:   BloggerCourse.com  Fact Sheet for Healthcare Providers:  SeriousBroker.it  This test is no t yet approved or cleared by the [place] FDA and  has been authorized for detection and/or diagnosis of SARS-CoV-2 by FDA under an Emergency Use Authorization (EUA). This EUA will remain  in effect (meaning this test can be used) for the duration of the COVID-19 declaration under Section 564(b)(1) of the Act, 21 U.S.C.section 360bbb-3(b)(1), unless the authorization is terminated  or revoked sooner.       Influenza A by PCR NEGATIVE NEGATIVE Final   Influenza B by PCR NEGATIVE NEGATIVE Final    Comment: (NOTE) The Xpert Xpress SARS-CoV-2/FLU/RSV plus assay is intended as an aid in the diagnosis of influenza from Nasopharyngeal swab specimens and should not be used as a sole basis for treatment. Nasal washings and aspirates are unacceptable for Xpert Xpress SARS-CoV-2/FLU/RSV testing.  Fact Sheet for Patients: BloggerCourse.com  Fact Sheet for Healthcare Providers: SeriousBroker.it  This test is not yet approved or cleared by the [place] FDA and has been authorized for detection and/or diagnosis of SARS-CoV-2 by FDA under an Emergency Use Authorization (EUA). This EUA will remain in effect (meaning this test can be used) for the duration of the COVID-19 declaration under Section 564(b)(1) of the Act, 21 U.S.C. section 360bbb-3(b)(1), unless the authorization is terminated or revoked.  Performed at [facility], [address], [place], [place] [address]   Blood Culture (routine x 2)     Status: None (Preliminary result)   Collection Time: 2021-01-31 11:34 AM   Specimen: BLOOD  Result Value Ref Range Status   Specimen Description BLOOD LEFT ANTECUBITAL  Final   Special Requests   Final    BOTTLES DRAWN AEROBIC AND ANAEROBIC Blood Culture results may not be optimal  due to an excessive volume of blood received in culture bottles   Culture   Final    NO GROWTH 2 DAYS Performed at [facility], [address], [place], [place] [address]    Report Status PENDING  Incomplete  Blood Culture (routine x 2)     Status: None (Preliminary result)   Collection Time: January 31, 2021 11:34 AM   Specimen: BLOOD  Result Value Ref Range Status   Specimen Description BLOOD BLOOD RIGHT ARM  Final   Special Requests   Final    BOTTLES DRAWN AEROBIC AND ANAEROBIC Blood Culture results may not be optimal due to an inadequate volume of blood received in culture bottles   Culture   Final    NO GROWTH 2 DAYS Performed at [facility], [address] [place], [place] [address]    Report Status PENDING  Incomplete  Urine culture     Status: Abnormal (Preliminary result)   Collection Time: 2021/01/31 11:34 AM   Specimen: Urine, Random  Result Value Ref Range Status   Specimen Description   Final    URINE, RANDOM Performed at [facility], [address] [place], [place] [address]    Special Requests   Final    NONE Performed at [facility], [address], [place], [place] [address]    Culture (A)  Final    >=100,000 COLONIES/mL ESCHERICHIA COLI 60,000 COLONIES/mL PSEUDOMONAS AERUGINOSA SUSCEPTIBILITIES TO FOLLOW Performed at [facility], [address], [place], [place] [address]    Report Status PENDING  Incomplete     Labs: Basic Metabolic Panel: Recent Labs  Lab January 31, 2021 1134  NA 146*  K 3.6  CL 112*  CO2 20*  GLUCOSE 133*  BUN 33*  CREATININE 0.56  CALCIUM 9.5  MG 2.2   Liver Function Tests: Recent Labs  Lab 2021/01/31 1134  AST 18  ALT 13  ALKPHOS 57  BILITOT 1.8*  PROT 6.4*  ALBUMIN 2.6*   No results for input(s): LIPASE, AMYLASE in the last 168 hours. No results for input(s): AMMONIA in the last 168 hours. CBC: Recent Labs  Lab Jan 31, 2021 1134  WBC 17.3*  NEUTROABS 16.1*  HGB 10.9*  HCT  32.7*  MCV 92.4  PLT 174   Cardiac Enzymes: No results for input(s): CKTOTAL, CKMB, CKMBINDEX, TROPONINI in the last 168 hours. D-Dimer Recent Labs    12/30/2020 1134  DDIMER 12.09*   BNP: Invalid input(s): POCBNP CBG: No results for input(s): GLUCAP in the last 168 hours. Anemia work up No results for input(s): VITAMINB12, FOLATE, FERRITIN, TIBC, IRON, RETICCTPCT in the last 72 hours. Urinalysis    Component Value Date/Time   COLORURINE AMBER (A) 12/18/2020 1134   APPEARANCEUR CLOUDY (A) 01/14/2021 1134   LABSPEC 1.029 01/08/2021 1134   PHURINE 5.0 01/14/2021 1134   GLUCOSEU NEGATIVE 01/11/2021 1134   HGBUR SMALL (A) 12/31/2020 1134   BILIRUBINUR NEGATIVE 12/29/2020 1134   KETONESUR 20 (A) 01/11/2021 1134   PROTEINUR 100 (A) 12/19/2020 1134   NITRITE POSITIVE (A) 12/20/2020 1134   LEUKOCYTESUR MODERATE (A) 01/15/2021 1134   Sepsis Labs Invalid input(s): PROCALCITONIN,  WBC,  LACTICIDVEN     SIGNED:  Lewie Chamberavid Jocie Meroney, MD  [facility] 01/14/2021, 3:28 PM

## 2021-01-17 NOTE — Progress Notes (Signed)
Patient expired on 01/14/2019. Time of expiration: 1615 as pronounced by this Clinical research associate and Doy Hutching, RN. At the time of death pt noted with no pulse, respirations or heart beat. Carotid pulse negative. Provider notified per hospital protocol. Daughter, [name], notified. Daughter was on her way to hospital when pt expired and made aware in person upon arrival to unit. Prior to pt's death, daughter had been notified within the last hour of pt's deterioration., at which time daughter reported that she was en route to the hospital. Daughter remained at bedside for some time with patient before leaving hospital. [facility] information obtained. [facility] notified per protocol. Pt not candidate for donation.

## 2021-01-17 NOTE — Progress Notes (Signed)
Progress Note    Emily Murillo   UEA:[id number]  DOB: [birth year]  DOA: 01/15/2021     1  PCP: Keane Police, MD  CC: AMS  Hospital Course: Emily Murillo is a 80 y.o. 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 She was placed on a nonrebreather mask @ 10L by EMS with improvement in her pulse oximetry to 95% and was transported to the ER for further evaluation. Upon arrival to the ER she was noted to be very lethargic and responds only to painful stimuli.  She was on high flow oxygen at 12 L. Labs show sodium 146, potassium 3.6, chloride 112, bicarb 20, glucose 133, BUN 33, creatinine 0.56, calcium 9.5, magnesium 2.2, alkaline phosphatase 57, albumin 2.6, AST 18, ALT 13, total protein 6.4, troponin 64, lactic acid 2.1 >> 3.1, procalcitonin 0.79, white count 17.3, hemoglobin 10.9, hematocrit 32.7, MCV 92.4, RDW 14.5, platelet count 174, D-dimer 12.09, PT 15.5, INR 1.2 Respiratory viral panel is negative Urine analysis showed significant pyuria Chest x-ray reviewed by me shows no acute pulmonary disease. CT scan of the head without contrast shows age related atrophy. 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 Chronic small-vessel changes of the white matter. No acute or reversible finding. CT scan of chest, abdomen and pelvis shows Bilateral pulmonary emboli are noted. Large bilateral lower lobe airspace opacities are noted concerning for pneumonia or atelectasis.  Interval History:  Minimally responsive in bed with audible secretions.  Appeared to be mildly uncomfortable but no obvious distress.  Oxygen in place.  Remains on comfort  measures.  ROS: Review of systems not obtained due to patient factors.Minimal respnsiveness  Assessment & Plan: Principal Problem:   Sepsis (HCC) Active Problems:   Acute respiratory failure (HCC)   Acute lower UTI   Chronic indwelling Foley catheter   Dementia (HCC)   Schizoaffective disorder (HCC)   Malnutrition (HCC)   Pneumonia   Decubitus ulcer of sacral region, unstageable (HCC)   Pulmonary embolism (HCC)    Patient presents to the emergency room for evaluation of mental status changes and is noted to be septic, source of sepsis appears to be multifactorial and includes a urinary source, infected sacral decubitus ulcers as well as from pneumonia.  She also has acute respiratory failure from pneumonia as well as pulmonary embolism as well as metabolic encephalopathy. -Prognosis remains poor and comfort care appropriate - Family still in agreement which was discussed on admission - Continue comfort care measures   Old records reviewed in assessment of this patient  Antimicrobials:   DVT prophylaxis:    Code Status:   Code Status: DNR Family Communication:   Disposition Plan: Status is: Inpatient  Remains inpatient appropriate because:comfort care measures in place   Dispo: The patient is from: SNF              Anticipated d/c is to: comfort care; in hospital death anticipated              Patient currently is not medically stable to d/c.   Difficult to place patient No Risk of unplanned readmission score: Unplanned Admission- Pilot do not use: 17.89   Objective: Blood pressure 128/71, pulse (!) 108, temperature  97.6 F (36.4 C), resp. rate 16, height 5' (1.524 m), weight 45.4 kg, SpO2 (!) 87 %.  Examination: General appearance: minimally responsive; uncomfortable appearing; audible secretions Head: Normocephalic, without obvious abnormality, atraumatic Eyes: EOMI Lungs: coarse breath sounds bilaterally Heart: regular rate and rhythm and S1, S2  normal Abdomen: thin, soft, BS present Extremities: no edema; contracted hands and limbs Skin: mobility and turgor normal Neurologic: unable to fully assess; does not follow commands; minimally responsive  Consultants:     Procedures:     Data Reviewed: I have personally reviewed following labs and imaging studies Results for orders placed or performed during the hospital encounter of 01/16/2021 (from the past 24 hour(s))  Lactic acid, plasma     Status: Abnormal   Collection Time: 01/15/2021  1:41 PM  Result Value Ref Range   Lactic Acid, Venous 3.1 (HH) 0.5 - 1.9 mmol/L  Troponin I (High Sensitivity)     Status: Abnormal   Collection Time: 01/05/2021  1:41 PM  Result Value Ref Range   Troponin I (High Sensitivity) 64 (H) <18 ng/L    Recent Results (from the past 240 hour(s))  Resp Panel by RT-PCR (Flu A&B, Covid) Nasopharyngeal Swab     Status: None   Collection Time: 12/19/2020 11:34 AM   Specimen: Nasopharyngeal Swab; Nasopharyngeal(NP) swabs in vial transport medium  Result Value Ref Range Status   SARS Coronavirus 2 by RT PCR NEGATIVE NEGATIVE Final    Comment: (NOTE) SARS-CoV-2 target nucleic acids are NOT DETECTED.  The SARS-CoV-2 RNA is generally detectable in upper respiratory specimens during the acute phase of infection. The lowest concentration of SARS-CoV-2 viral copies this assay can detect is 138 copies/mL. A negative result does not preclude SARS-Cov-2 infection and should not be used as the sole basis for treatment or other patient management decisions. A negative result may occur with  improper specimen collection/handling, submission of specimen other than nasopharyngeal swab, presence of viral mutation(s) within the areas targeted by this assay, and inadequate number of viral copies(<138 copies/mL). A negative result must be combined with clinical observations, patient history, and epidemiological information. The expected result is Negative.  Fact Sheet  for Patients:  BloggerCourse.com  Fact Sheet for Healthcare Providers:  SeriousBroker.it  This test is no t yet approved or cleared by the [place] FDA and  has been authorized for detection and/or diagnosis of SARS-CoV-2 by FDA under an Emergency Use Authorization (EUA). This EUA will remain  in effect (meaning this test can be used) for the duration of the COVID-19 declaration under Section 564(b)(1) of the Act, 21 U.S.C.section 360bbb-3(b)(1), unless the authorization is terminated  or revoked sooner.       Influenza A by PCR NEGATIVE NEGATIVE Final   Influenza B by PCR NEGATIVE NEGATIVE Final    Comment: (NOTE) The Xpert Xpress SARS-CoV-2/FLU/RSV plus assay is intended as an aid in the diagnosis of influenza from Nasopharyngeal swab specimens and should not be used as a sole basis for treatment. Nasal washings and aspirates are unacceptable for Xpert Xpress SARS-CoV-2/FLU/RSV testing.  Fact Sheet for Patients: BloggerCourse.com  Fact Sheet for Healthcare Providers: SeriousBroker.it  This test is not yet approved or cleared by the [place] FDA and has been authorized for detection and/or diagnosis of SARS-CoV-2 by FDA under an Emergency Use Authorization (EUA). This EUA will remain in effect (meaning this test can be used) for the duration of the COVID-19 declaration under Section 564(b)(1) of the Act, 21 U.S.C. section 360bbb-3(b)(1), unless  the authorization is terminated or revoked.  Performed at [facility], [address] [place], [place] [address]   Blood Culture (routine x 2)     Status: None (Preliminary result)   Collection Time: 01-29-2021 11:34 AM   Specimen: BLOOD  Result Value Ref Range Status   Specimen Description BLOOD LEFT ANTECUBITAL  Final   Special Requests   Final    BOTTLES DRAWN AEROBIC AND ANAEROBIC Blood Culture results may  not be optimal due to an excessive volume of blood received in culture bottles   Culture   Final    NO GROWTH < 24 HOURS Performed at [facility], [address] [place], [place] [address]    Report Status PENDING  Incomplete  Blood Culture (routine x 2)     Status: None (Preliminary result)   Collection Time: January 29, 2021 11:34 AM   Specimen: BLOOD  Result Value Ref Range Status   Specimen Description BLOOD BLOOD RIGHT ARM  Final   Special Requests   Final    BOTTLES DRAWN AEROBIC AND ANAEROBIC Blood Culture results may not be optimal due to an inadequate volume of blood received in culture bottles   Culture   Final    NO GROWTH < 24 HOURS Performed at [facility], [address], [place], [place] [address]    Report Status PENDING  Incomplete     Radiology Studies: CT Head Wo Contrast  Result Date: 2021/01/29 CLINICAL DATA:  Mental status changes of unknown cause. EXAM: CT HEAD WITHOUT CONTRAST TECHNIQUE: Contiguous axial images were obtained from the base of the skull through the vertex without intravenous contrast. COMPARISON:  None. FINDINGS: Brain: Age related atrophy. No focal abnormality affects the brainstem or cerebellum. 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 No cortical or large vessel territory infarction. No sign of acute infarction. No mass, hemorrhage, hydrocephalus or extra-axial collection. Vascular: There is atherosclerotic calcification of the major vessels at the base of the brain. Skull: Negative Sinuses/Orbits: Clear/normal Other: None IMPRESSION: Age related atrophy. 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 Chronic small-vessel changes of the white matter. No acute or reversible finding. Electronically Signed   By: Paulina Fusi M.D.   On: January 29, 2021 12:57   CT Angio Chest PE W and/or Wo Contrast  Result Date: 2021/01/29 CLINICAL DATA:  Abdominal pain, fever.  EXAM: CT ANGIOGRAPHY CHEST CT ABDOMEN AND PELVIS WITH CONTRAST TECHNIQUE: Multidetector CT imaging of the chest was performed using the standard protocol during bolus administration of intravenous contrast. Multiplanar CT image reconstructions and MIPs were obtained to evaluate the vascular anatomy. Multidetector CT imaging of the abdomen and pelvis was performed using the standard protocol during bolus administration of intravenous contrast. CONTRAST:  40mL OMNIPAQUE IOHEXOL 350 MG/ML SOLN COMPARISON:  None. FINDINGS: CTA CHEST FINDINGS Cardiovascular: Multiple segmental or smaller filling defects are seen involving the upper and lower lobe branches of the right pulmonary artery. Smaller peripheral defects are noted involving the upper lobe branches of the left pulmonary artery. Mild cardiomegaly. No pericardial effusion. Coronary artery calcifications are noted. Atherosclerosis of thoracic aorta is noted without aneurysm or dissection. Mediastinum/Nodes: 4 cm left thyroid nodule is noted. No adenopathy is noted. Esophagus is unremarkable. Lungs/Pleura: No pneumothorax is noted. Bilateral lower lobe airspace opacities are noted concerning for pneumonia or atelectasis. Musculoskeletal: No chest wall abnormality. No acute or significant osseous findings. Review of the MIP images confirms the above findings. CT  ABDOMEN and PELVIS FINDINGS Hepatobiliary: No focal liver abnormality is seen. No gallstones, gallbladder wall thickening, or biliary dilatation. Pancreas: Unremarkable. No pancreatic ductal dilatation or surrounding inflammatory changes. Spleen: Normal in size without focal abnormality. Adrenals/Urinary Tract: Adrenal glands and kidneys appear normal. Foley catheter is noted in urinary bladder. Stomach/Bowel: Stomach is unremarkable. Stool is noted in the colon. No abnormal small bowel dilatation is noted. Moderately dilated sigmoid colon and rectum is noted with wall thickening concerning for infection or  inflammation. There is seen inflammatory stranding around this portion of the bowel. Vascular/Lymphatic: Aortic atherosclerosis. No enlarged abdominal or pelvic lymph nodes. Reproductive: Uterus is not clearly identified and presumably patient is status post hysterectomy. No adnexal abnormality is noted. Other: Minimal ascites is noted. Small to moderate size hernia is noted in the right lower quadrant which appears to contain a loop of bowel, but does not result in definite obstruction. Musculoskeletal: Multilevel degenerative disc disease is noted in the lumbar spine. No acute osseous abnormality is noted. Review of the MIP images confirms the above findings. IMPRESSION: Bilateral pulmonary emboli are noted. Critical Value/emergent results were called by telephone at the time of interpretation on 05-01-21 at 3:38 pm to provider [facility] , who verbally acknowledged these results. Large bilateral lower lobe airspace opacities are noted concerning for pneumonia or atelectasis. 4 cm left thyroid nodule is noted. Recommend thyroid US. (Ref: J Am Coll Radiol. 2015 Feb;12(2): 143-50). Moderately dilated distal sigmoid colon and rectum is noted with wall thickening and surrounding inflammation concerning for infectious or inflammatory colitis. Postsurgical changes are seen involving the proximal sigmoid colon. Small to moderate size hernia is noted in right lower quadrant which appears to contain a loop of bowel, but does not appear to be resulting in definite obstruction. Minimal ascites. Coronary artery calcifications are noted. Aortic Atherosclerosis (ICD10-I70.0). Electronically Signed   By: Lupita RaiderJames  Green Jr M.D.   On: 009-13-22 15:39   CT ABDOMEN PELVIS W CONTRAST  Result Date: 05-01-21 CLINICAL DATA:  Abdominal pain, fever. EXAM: CT ANGIOGRAPHY CHEST CT ABDOMEN AND PELVIS WITH CONTRAST TECHNIQUE: Multidetector CT imaging of the chest was performed using the standard protocol during bolus administration  of intravenous contrast. Multiplanar CT image reconstructions and MIPs were obtained to evaluate the vascular anatomy. Multidetector CT imaging of the abdomen and pelvis was performed using the standard protocol during bolus administration of intravenous contrast. CONTRAST:  50mL OMNIPAQUE IOHEXOL 350 MG/ML SOLN COMPARISON:  None. FINDINGS: CTA CHEST FINDINGS Cardiovascular: Multiple segmental or smaller filling defects are seen involving the upper and lower lobe branches of the right pulmonary artery. Smaller peripheral defects are noted involving the upper lobe branches of the left pulmonary artery. Mild cardiomegaly. No pericardial effusion. Coronary artery calcifications are noted. Atherosclerosis of thoracic aorta is noted without aneurysm or dissection. Mediastinum/Nodes: 4 cm left thyroid nodule is noted. No adenopathy is noted. Esophagus is unremarkable. Lungs/Pleura: No pneumothorax is noted. Bilateral lower lobe airspace opacities are noted concerning for pneumonia or atelectasis. Musculoskeletal: No chest wall abnormality. No acute or significant osseous findings. Review of the MIP images confirms the above findings. CT ABDOMEN and PELVIS FINDINGS Hepatobiliary: No focal liver abnormality is seen. No gallstones, gallbladder wall thickening, or biliary dilatation. Pancreas: Unremarkable. No pancreatic ductal dilatation or surrounding inflammatory changes. Spleen: Normal in size without focal abnormality. Adrenals/Urinary Tract: Adrenal glands and kidneys appear normal. Foley catheter is noted in urinary bladder. Stomach/Bowel: Stomach is unremarkable. Stool is noted in the colon. No abnormal small bowel dilatation  is noted. Moderately dilated sigmoid colon and rectum is noted with wall thickening concerning for infection or inflammation. There is seen inflammatory stranding around this portion of the bowel. Vascular/Lymphatic: Aortic atherosclerosis. No enlarged abdominal or pelvic lymph nodes.  Reproductive: Uterus is not clearly identified and presumably patient is status post hysterectomy. No adnexal abnormality is noted. Other: Minimal ascites is noted. Small to moderate size hernia is noted in the right lower quadrant which appears to contain a loop of bowel, but does not result in definite obstruction. Musculoskeletal: Multilevel degenerative disc disease is noted in the lumbar spine. No acute osseous abnormality is noted. Review of the MIP images confirms the above findings. IMPRESSION: Bilateral pulmonary emboli are noted. Critical Value/emergent results were called by telephone at the time of interpretation on 01/08/2021 at 3:38 pm to provider [facility] , who verbally acknowledged these results. Large bilateral lower lobe airspace opacities are noted concerning for pneumonia or atelectasis. 4 cm left thyroid nodule is noted. Recommend thyroid US. (Ref: J Am Coll Radiol. 2015 Feb;12(2): 143-50). Moderately dilated distal sigmoid colon and rectum is noted with wall thickening and surrounding inflammation concerning for infectious or inflammatory colitis. Postsurgical changes are seen involving the proximal sigmoid colon. Small to moderate size hernia is noted in right lower quadrant which appears to contain a loop of bowel, but does not appear to be resulting in definite obstruction. Minimal ascites. Coronary artery calcifications are noted. Aortic Atherosclerosis (ICD10-I70.0). Electronically Signed   By: Lupita Raider M.D.   On: 01/03/2021 15:39   DG Chest Port 1 View  Result Date: 01/09/2021 CLINICAL DATA:  Possible sepsis EXAM: PORTABLE CHEST 1 VIEW COMPARISON:  None. FINDINGS: Heart size within normal limits. Atherosclerosis and tortuosity of the aorta. No sign of pulmonary infiltrate, collapse or effusion. Chronic degenerative changes affect the spine and shoulders. IMPRESSION: No active process evident. Electronically Signed   By: Paulina Fusi M.D.   On: 12/25/2020 12:00   CT Angio  Chest PE W and/or Wo Contrast  Final Result    CT ABDOMEN PELVIS W CONTRAST  Final Result    CT Head Wo Contrast  Final Result    DG Chest Port 1 View  Final Result      Scheduled Meds: . scopolamine  1 patch Transdermal Q72H  . sodium chloride flush  3 mL Intravenous Q12H   PRN Meds: sodium chloride, acetaminophen **OR** acetaminophen, antiseptic oral rinse, glycopyrrolate **OR** glycopyrrolate **OR** glycopyrrolate, haloperidol **OR** haloperidol **OR** haloperidol lactate, morphine injection, ondansetron **OR** ondansetron (ZOFRAN) IV, polyvinyl alcohol, sodium chloride flush Continuous Infusions: . sodium chloride    . lactated ringers       LOS: 1 day  Time spent: Greater than 50% of the 35 minute visit was spent in counseling/coordination of care for the patient as laid out in the A&P.   Lewie Chamber, MD [facility] 02/01/2021, 1:13 PM

## 2021-01-17 NOTE — Progress Notes (Signed)
Chaplain Maggie made initial visit to patient's room shortly after her death. Patient's daughter was at the beside. Chaplain offered condolences and non-anxious presence. The request was made by the daughter to have time alone with her mother. Space was made to honor request. Lunette Stands available for continued support as needed.

## 2021-01-17 DEATH — deceased

## 2021-01-24 NOTE — Unmapped (Signed)
[facility] Administered Medication Refill Coordination Note      NAME:Cindy [name] DOB: [birth year]      Medication: invega  Day Supply: 28 days      SHIPPING      Next delivery from [facility] [phone]) to [facility] for Cindy [name] is scheduled for na/2.    Clinic contact: Annabell Sabal    Patient's next nurse visit for administration: clinic denied refill @ this time requested a f/u in 2 weeks.    We will follow up with clinic monthly for standard refill processing and delivery.      Briellah Baik Samella Parr  Specialty Pharmacy Technician

## 2021-02-07 NOTE — Unmapped (Signed)
Specialty Medication(s): [name]    Cindy [name] has been dis-enrolled from the [facility] specialty pharmacy services due to per clinic requested to be dis-enroll.    Additional information provided to the patient:     Antonietta Barcelona  [facility] Specialty Pharmacist

## 2021-11-17 IMAGING — CT CT HEAD W/O CM
3 series · 16 of 47 positions shown, 19 images · non-contrast
Comparison: None.

CLINICAL DATA: Mental status changes of unknown cause.

EXAM:
CT HEAD WITHOUT CONTRAST
TECHNIQUE: Contiguous axial images were obtained from the base of the skull
through the vertex without intravenous contrast.

[Series 3: head wo · axial · 0.46mm/px · z∈[-122,+13]mm · 10 of 33 slices shown, 13 images]
[im 3/33  brain]
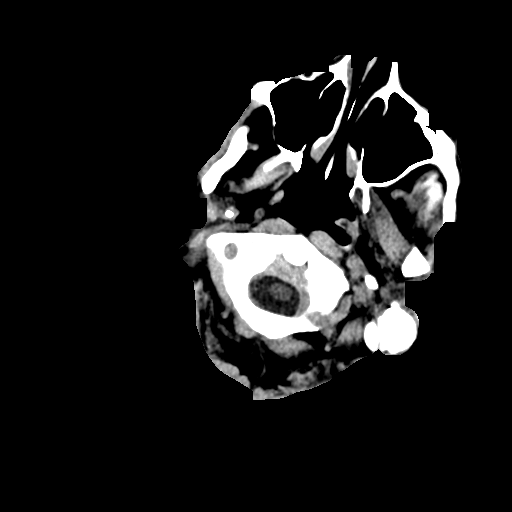
[im 3/33  bone]
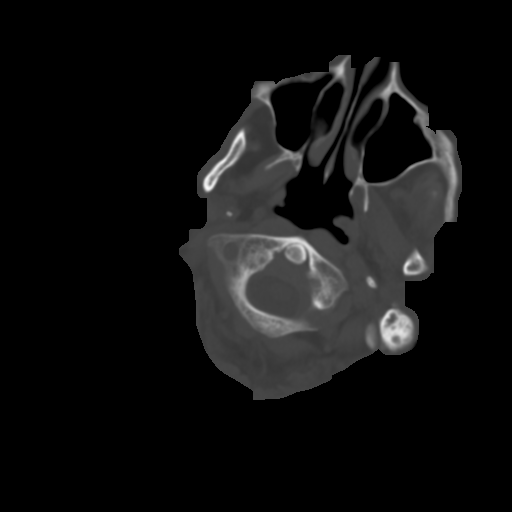
[im 6/33  brain]
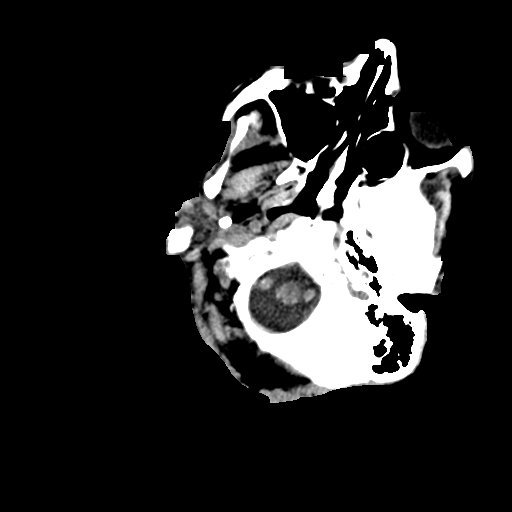
[im 9/33  brain]
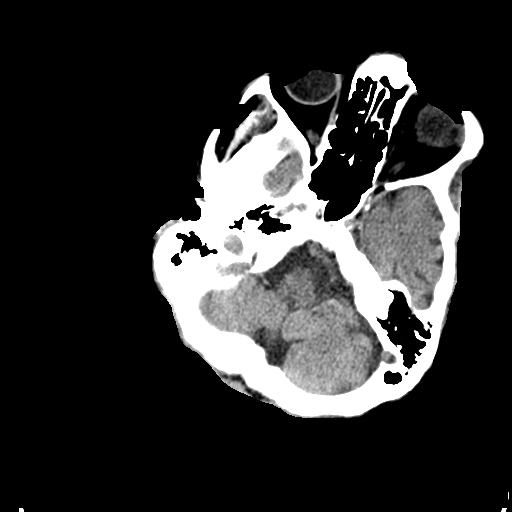
[im 12/33  brain]
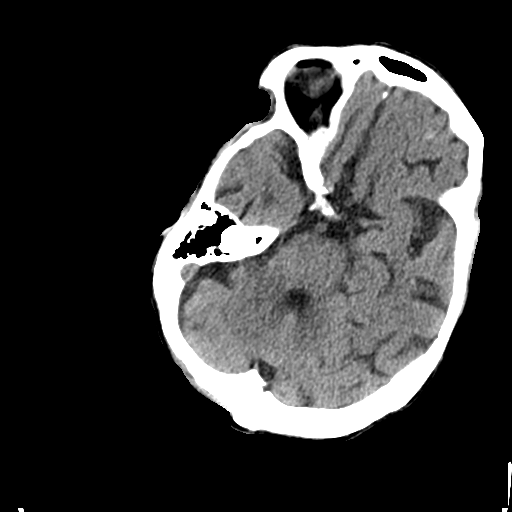
[im 15/33  brain]
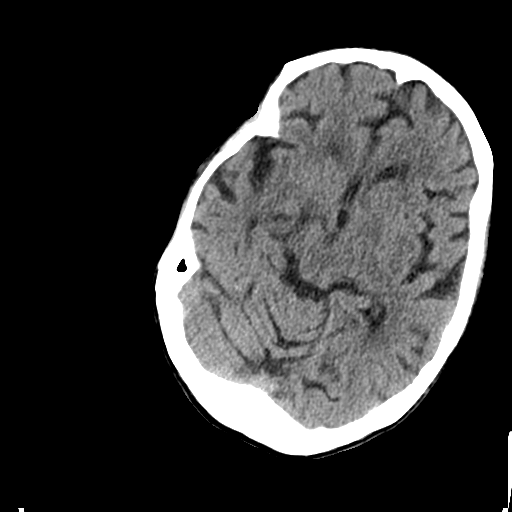
[im 15/33  bone]
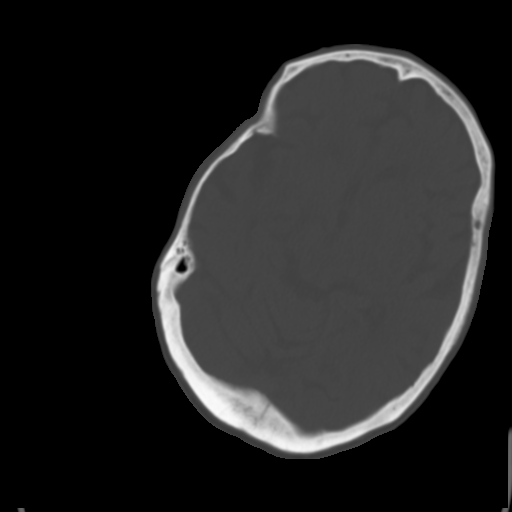
[im 18/33  brain]
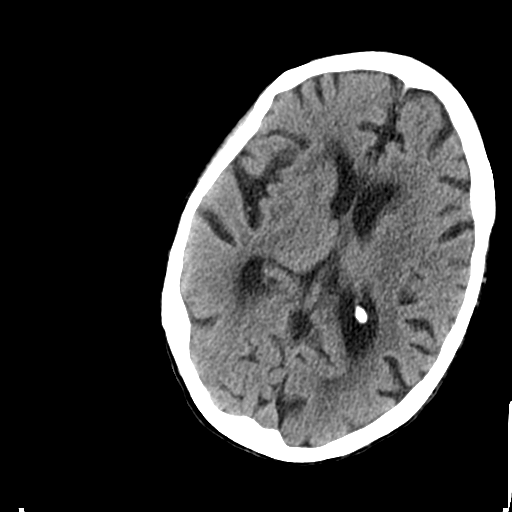
[im 21/33  brain]
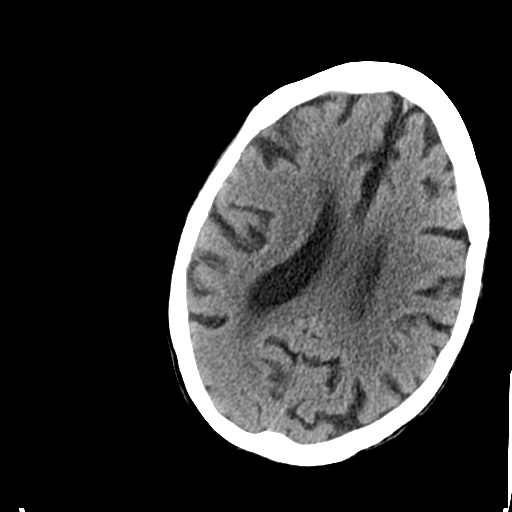
[im 25/33  brain]
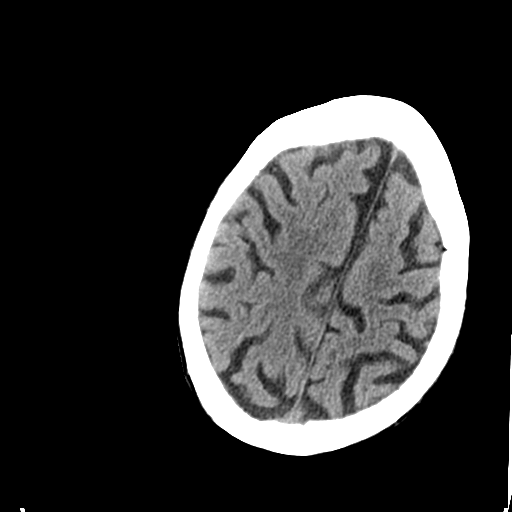
[im 27/33  brain]
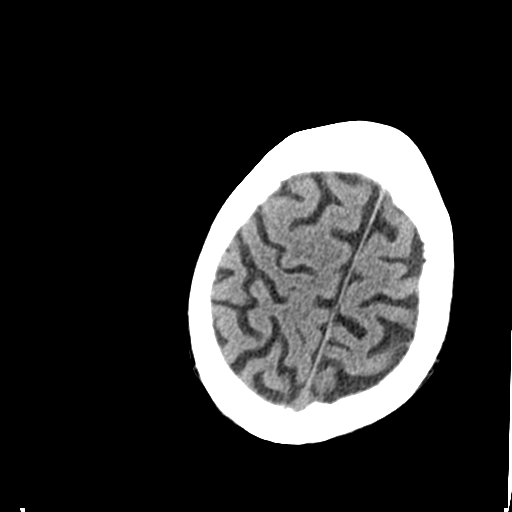
[im 27/33  bone]
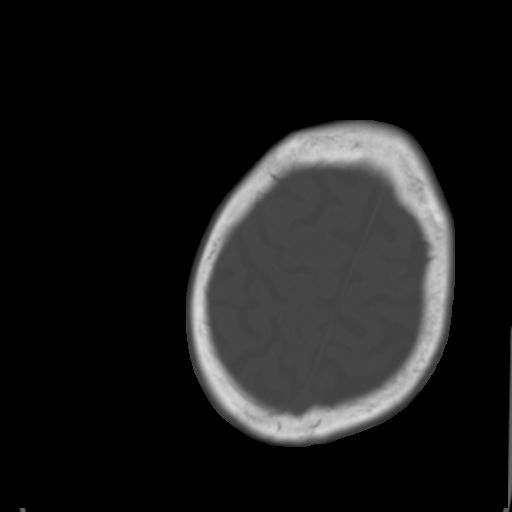
[im 30/33  brain]
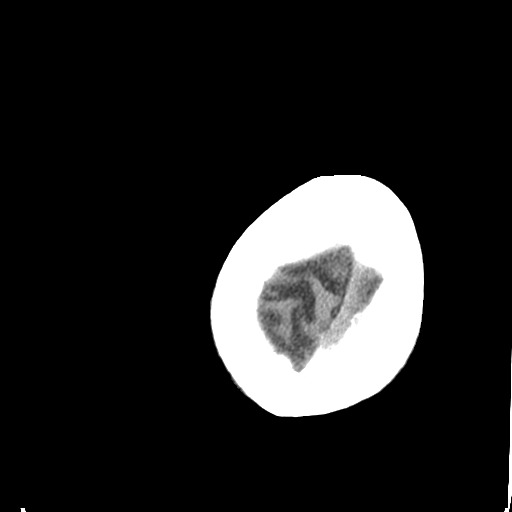

[Series 4: coronal soft tissue · coronal · 0.29mm/px · 3 of 69 slices shown]
[im 23/69  brain]
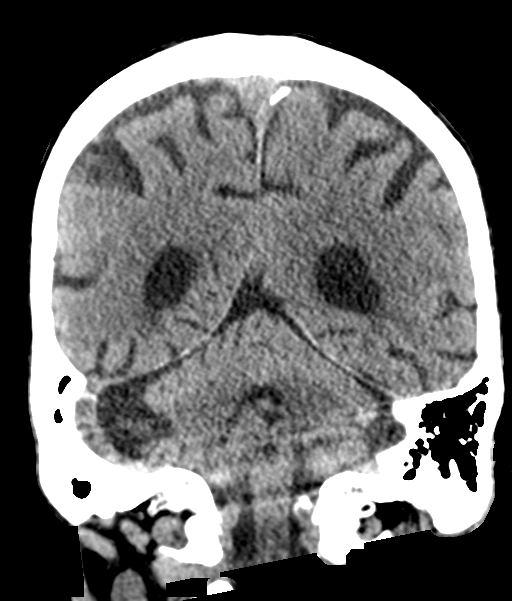
[im 31/69  brain]
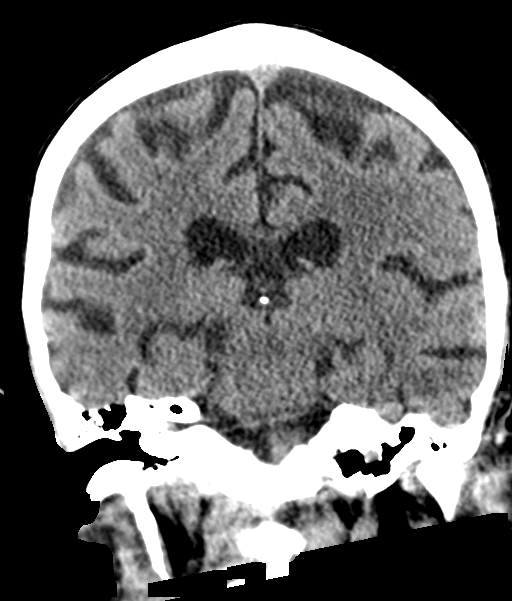
[im 38/69  brain]
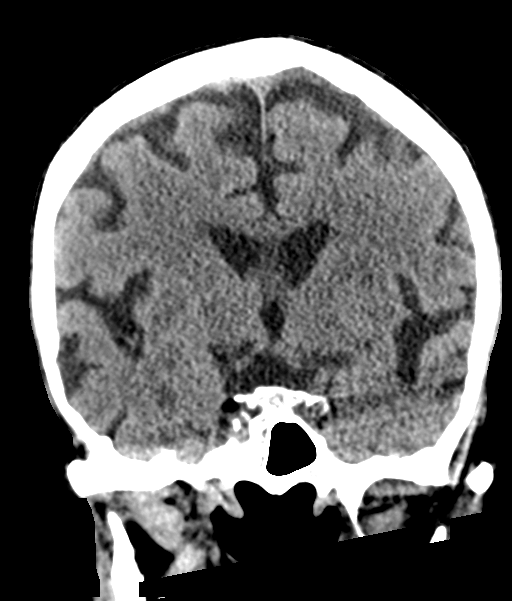

[Series 5: sagittal soft tissue · sagittal · 0.34mm/px · 3 of 50 slices shown]
[im 20/50  brain]
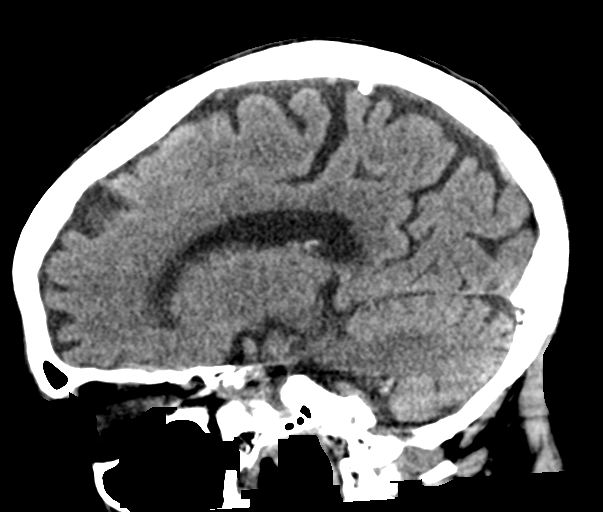
[im 25/50  brain]
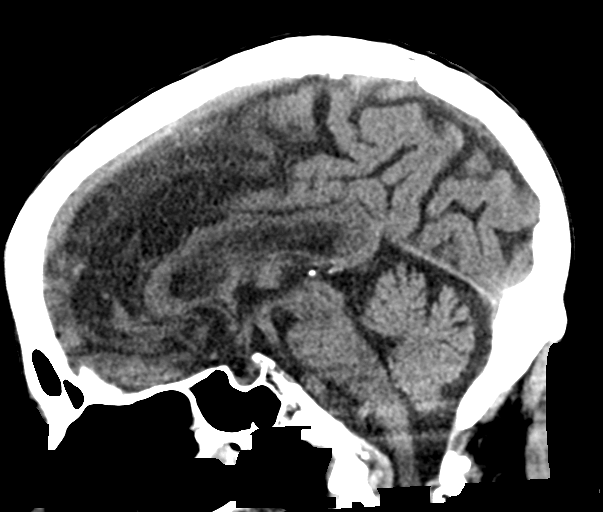
[im 30/50  brain]
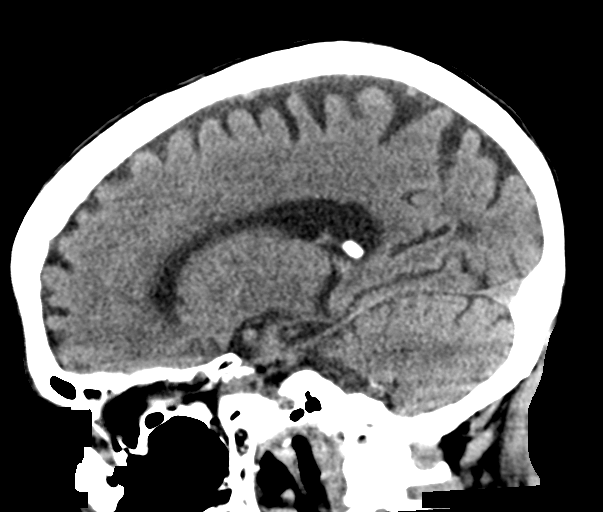

[16 of 47 positions shown; findings below may reference images not displayed]

FINDINGS: Brain: Age related atrophy. No focal abnormality affects the
brainstem or cerebellum. Old small vessel infarctions in the right
basal ganglia and affecting the cerebral hemispheric white matter.
No cortical or large vessel territory infarction. No sign of acute
infarction. No mass, hemorrhage, hydrocephalus or extra-axial
collection.

Vascular: There is atherosclerotic calcification of the major
vessels at the base of the brain.

Skull: Negative

Sinuses/Orbits: Clear/normal

Other: None
IMPRESSION: Age related atrophy. Old small vessel infarctions in the right basal
ganglia. Chronic small-vessel changes of the white matter. No acute
or reversible finding.

## 2021-11-17 IMAGING — DX DG CHEST 1V PORT
1 series · 1 of 1 positions shown · non-contrast
Comparison: None.

CLINICAL DATA: Possible sepsis

EXAM:
PORTABLE CHEST 1 VIEW

[chest ap]
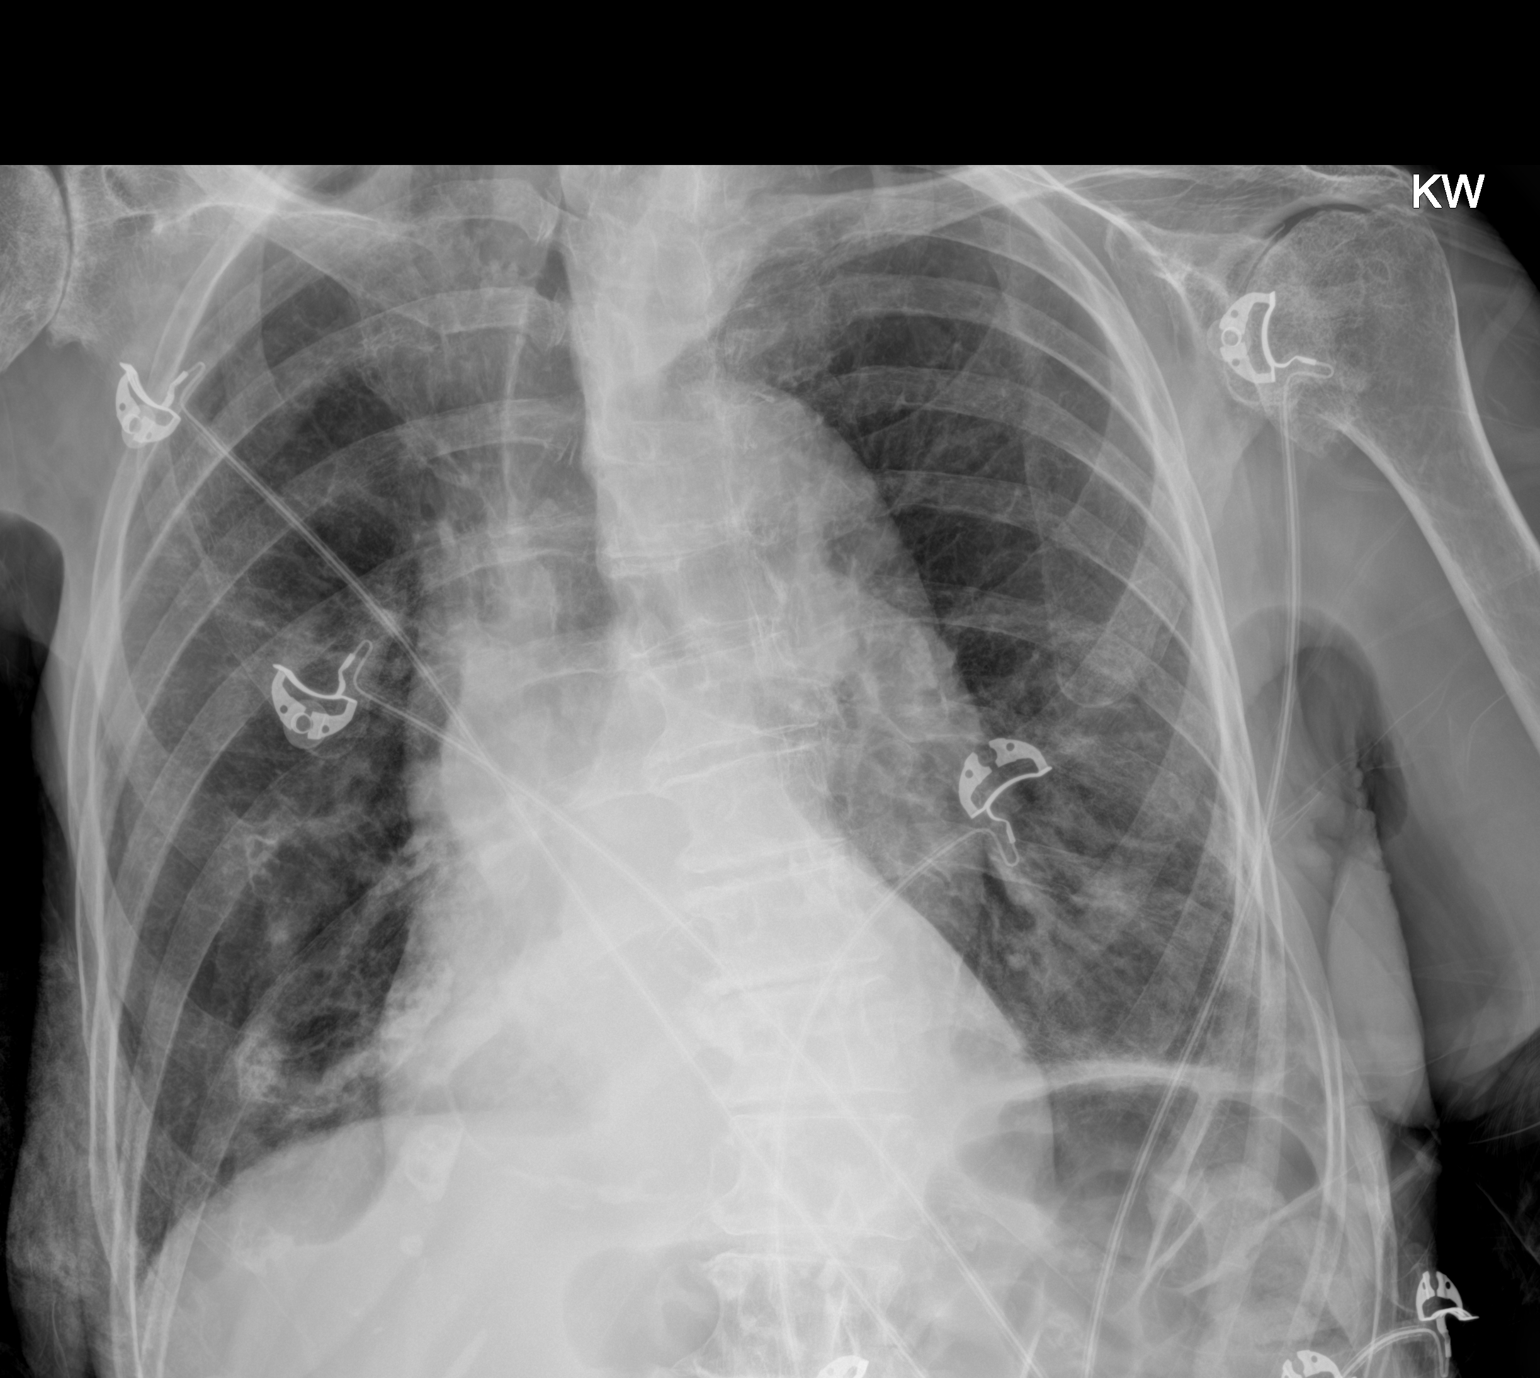

[1 of 1 positions shown; findings below may reference images not displayed]

FINDINGS: Heart size within normal limits. Atherosclerosis and tortuosity of
the aorta. No sign of pulmonary infiltrate, collapse or effusion.
Chronic degenerative changes affect the spine and shoulders.
IMPRESSION: No active process evident.
